# Patient Record
Sex: Male | Born: 1945 | Race: White | Hispanic: No | Marital: Married | Smoking: Former smoker
Health system: Southern US, Community
[De-identification: ages and names within clinical notes are randomized; demographics above are authoritative.]

## PROBLEM LIST (undated history)

## (undated) DIAGNOSIS — M1990 Unspecified osteoarthritis, unspecified site: Secondary | ICD-10-CM

## (undated) DIAGNOSIS — I10 Essential (primary) hypertension: Secondary | ICD-10-CM

## (undated) DIAGNOSIS — D472 Monoclonal gammopathy: Secondary | ICD-10-CM

## (undated) DIAGNOSIS — C61 Malignant neoplasm of prostate: Secondary | ICD-10-CM

## (undated) DIAGNOSIS — E7800 Pure hypercholesterolemia, unspecified: Secondary | ICD-10-CM

## (undated) DIAGNOSIS — D649 Anemia, unspecified: Secondary | ICD-10-CM

## (undated) DIAGNOSIS — Z8739 Personal history of other diseases of the musculoskeletal system and connective tissue: Secondary | ICD-10-CM

## (undated) DIAGNOSIS — N189 Chronic kidney disease, unspecified: Secondary | ICD-10-CM

## (undated) DIAGNOSIS — K219 Gastro-esophageal reflux disease without esophagitis: Secondary | ICD-10-CM

## (undated) HISTORY — DX: Monoclonal gammopathy: D47.2

## (undated) HISTORY — PX: OTHER SURGICAL HISTORY: SHX169

## (undated) HISTORY — DX: Pure hypercholesterolemia, unspecified: E78.00

## (undated) HISTORY — PX: TONSILLECTOMY: SUR1361

---

## 2014-01-30 ENCOUNTER — Other Ambulatory Visit: Payer: Self-pay | Admitting: Physician Assistant

## 2014-01-30 ENCOUNTER — Ambulatory Visit
Admission: RE | Admit: 2014-01-30 | Discharge: 2014-01-30 | Disposition: A | Discharge status: Home / Self Care | Payer: Commercial Managed Care - HMO | Source: Ambulatory Visit | Attending: Physician Assistant | Admitting: Physician Assistant

## 2014-01-30 DIAGNOSIS — M25462 Effusion, left knee: Secondary | ICD-10-CM

## 2014-01-30 DIAGNOSIS — M25562 Pain in left knee: Principal | ICD-10-CM

## 2015-01-26 DIAGNOSIS — M109 Gout, unspecified: Secondary | ICD-10-CM | POA: Diagnosis not present

## 2015-01-26 DIAGNOSIS — N183 Chronic kidney disease, stage 3 (moderate): Secondary | ICD-10-CM | POA: Diagnosis not present

## 2015-01-26 DIAGNOSIS — I129 Hypertensive chronic kidney disease with stage 1 through stage 4 chronic kidney disease, or unspecified chronic kidney disease: Secondary | ICD-10-CM | POA: Diagnosis not present

## 2015-02-26 DIAGNOSIS — N183 Chronic kidney disease, stage 3 (moderate): Secondary | ICD-10-CM | POA: Diagnosis not present

## 2015-02-26 DIAGNOSIS — I129 Hypertensive chronic kidney disease with stage 1 through stage 4 chronic kidney disease, or unspecified chronic kidney disease: Secondary | ICD-10-CM | POA: Diagnosis not present

## 2015-08-31 DIAGNOSIS — N402 Nodular prostate without lower urinary tract symptoms: Secondary | ICD-10-CM | POA: Diagnosis not present

## 2015-08-31 DIAGNOSIS — N183 Chronic kidney disease, stage 3 (moderate): Secondary | ICD-10-CM | POA: Diagnosis not present

## 2015-08-31 DIAGNOSIS — Z125 Encounter for screening for malignant neoplasm of prostate: Secondary | ICD-10-CM | POA: Diagnosis not present

## 2015-08-31 DIAGNOSIS — M109 Gout, unspecified: Secondary | ICD-10-CM | POA: Diagnosis not present

## 2015-08-31 DIAGNOSIS — Z0000 Encounter for general adult medical examination without abnormal findings: Secondary | ICD-10-CM | POA: Diagnosis not present

## 2015-08-31 DIAGNOSIS — I129 Hypertensive chronic kidney disease with stage 1 through stage 4 chronic kidney disease, or unspecified chronic kidney disease: Secondary | ICD-10-CM | POA: Diagnosis not present

## 2015-08-31 DIAGNOSIS — Z23 Encounter for immunization: Secondary | ICD-10-CM | POA: Diagnosis not present

## 2015-09-02 DIAGNOSIS — N402 Nodular prostate without lower urinary tract symptoms: Secondary | ICD-10-CM | POA: Diagnosis not present

## 2015-09-02 DIAGNOSIS — R972 Elevated prostate specific antigen [PSA]: Secondary | ICD-10-CM | POA: Diagnosis not present

## 2015-09-02 DIAGNOSIS — N289 Disorder of kidney and ureter, unspecified: Secondary | ICD-10-CM | POA: Diagnosis not present

## 2015-09-03 DIAGNOSIS — Z0000 Encounter for general adult medical examination without abnormal findings: Secondary | ICD-10-CM | POA: Diagnosis not present

## 2015-09-03 DIAGNOSIS — R972 Elevated prostate specific antigen [PSA]: Secondary | ICD-10-CM | POA: Diagnosis not present

## 2015-09-07 ENCOUNTER — Other Ambulatory Visit (HOSPITAL_COMMUNITY): Payer: Self-pay | Admitting: Urology

## 2015-09-07 DIAGNOSIS — C61 Malignant neoplasm of prostate: Secondary | ICD-10-CM

## 2015-09-09 ENCOUNTER — Encounter: Payer: Self-pay | Admitting: Medical Oncology

## 2015-09-09 NOTE — Progress Notes (Signed)
Oncology Nurse Navigator Documentation  Oncology Nurse Navigator Flowsheets 09/09/2015  Navigator Location [facility]  Navigator Encounter Type Introductory phone call  Abnormal Finding Date 08/31/2015  Confirmed Diagnosis Date 09/03/2015  Barriers/Navigation Needs No barriers at this time  Acuity Level 1  Acuity Level 1 Initial guidance, education and coordination as needed  Time Spent with Patient 15   I called pt to introduce myself as the Prostate Nurse Navigator and the Coordinator of the Prostate [place].  1. I confirmed with the patient he is aware of his referral to the clinic 09/18/2015 arriving at 7:30am.  2. I discussed the format of the clinic and the physicians he will be seeing that day.  3. I discussed where the clinic is located and how to contact me.  4. I confirmed his address and informed him I would be mailing a packet of information and forms to be completed. I asked him to bring them with him the day of his appointment.   He voiced understanding of the above. I asked him to call me if he has any questions or concerns regarding his appointments or the forms he needs to complete.

## 2015-09-14 ENCOUNTER — Encounter: Payer: Self-pay | Admitting: Medical Oncology

## 2015-09-14 NOTE — Progress Notes (Signed)
Oncology Nurse Navigator Documentation  Oncology Nurse Navigator Flowsheets 09/09/2015 09/14/2015  Navigator Location [facility] -  Navigator Encounter Type Introductory phone call -  Abnormal Finding Date 08/31/2015 -  Confirmed Diagnosis Date 09/03/2015 -  Barriers/Navigation Needs No barriers at this time -  Interventions - Coordination of Care  Coordination of Care - Other-I called [facility] to request pathology slides be sent to Dr. Orene Desanctis at [facility] pathology.  Acuity Level 1 -  Acuity Level 1 Initial guidance, education and coordination as needed -  Time Spent with Patient 15 15

## 2015-09-15 ENCOUNTER — Encounter: Payer: Self-pay | Admitting: Radiation Oncology

## 2015-09-15 NOTE — Progress Notes (Signed)
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 Raynelle Bring: Biopsy of Prostate  Past/Anticipated interventions by medical oncology, if any: referred to [facility] for evaluation by Dr. Alen Blew  Weight changes, if any: no  Bowel/Bladder complaints, if any: denies significant voiding symptoms, IPSS 1, mild to moderate ED  Nausea/Vomiting, if any: no  Pain issues, if any:  no  SAFETY ISSUES:  Prior radiation? No  Pacemaker/ICD? No  Possible current pregnancy? N/A  Is the patient on methotrexate? No  Current Complaints / other details:  70 year old male. Married. Stage III kidney disease. 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 Married to [employer]. Daughter Sharyn Lull, lives in [place] and is unemployed. Daughter Izora Gala, lives in [place]. Son Corene Cornea, died. Reports he has never had a colonoscopy. Reports he does not perform routine self testicular exams.  Reports fatigue. Wears glasses and dentures.   Signed on behalf of Joaquim Lai, RN

## 2015-09-16 ENCOUNTER — Encounter: Payer: Self-pay | Admitting: Radiation Oncology

## 2015-09-16 ENCOUNTER — Encounter: Payer: Self-pay | Admitting: Adult Health

## 2015-09-16 DIAGNOSIS — C61 Malignant neoplasm of prostate: Secondary | ICD-10-CM | POA: Insufficient documentation

## 2015-09-17 ENCOUNTER — Telehealth: Payer: Self-pay | Admitting: Medical Oncology

## 2015-09-17 NOTE — Telephone Encounter (Signed)
Oncology Nurse Navigator Documentation  Oncology Nurse Navigator Flowsheets 09/09/2015 09/14/2015 09/17/2015  Navigator Location [facility] - -  Navigator Encounter Type Introductory phone call - Telephone  Telephone - - Outgoing Call;Appt Confirmation/Clarification- Left a message for Mr. Penton to remind him of his appointment for the Prostate [facility] 09/18/15. I reviewed the location of the [facility] and how to register. I asked him to bring his completed medical forms.  Abnormal Finding Date 08/31/2015 - -  Confirmed Diagnosis Date 09/03/2015 - -  Barriers/Navigation Needs No barriers at this time - -  Interventions - Coordination of Care -  Coordination of Care - Other -  Acuity Level 1 - Level 1  Acuity Level 1 Initial guidance, education and coordination as needed - Initial guidance, education and coordination as needed  Time Spent with Patient 15 15 15

## 2015-09-18 ENCOUNTER — Encounter: Payer: Self-pay | Admitting: Medical Oncology

## 2015-09-18 ENCOUNTER — Ambulatory Visit
Admission: RE | Admit: 2015-09-18 | Discharge: 2015-09-18 | Disposition: A | Discharge status: Home / Self Care | Payer: Medicare Other | Source: Ambulatory Visit | Attending: Radiation Oncology | Admitting: Radiation Oncology

## 2015-09-18 ENCOUNTER — Encounter: Payer: Self-pay | Admitting: Radiation Oncology

## 2015-09-18 ENCOUNTER — Ambulatory Visit (HOSPITAL_BASED_OUTPATIENT_CLINIC_OR_DEPARTMENT_OTHER): Payer: Medicare Other | Admitting: Oncology

## 2015-09-18 ENCOUNTER — Encounter: Payer: Self-pay | Admitting: General Practice

## 2015-09-18 ENCOUNTER — Encounter: Payer: Self-pay | Admitting: Adult Health

## 2015-09-18 VITALS — BP 161/61 | HR 63 | Resp 16 | Ht 69.0 in | Wt 192.7 lb

## 2015-09-18 DIAGNOSIS — C61 Malignant neoplasm of prostate: Secondary | ICD-10-CM

## 2015-09-18 DIAGNOSIS — N189 Chronic kidney disease, unspecified: Secondary | ICD-10-CM | POA: Insufficient documentation

## 2015-09-18 DIAGNOSIS — I10 Essential (primary) hypertension: Secondary | ICD-10-CM

## 2015-09-18 DIAGNOSIS — Z87891 Personal history of nicotine dependence: Secondary | ICD-10-CM

## 2015-09-18 DIAGNOSIS — Z806 Family history of leukemia: Secondary | ICD-10-CM

## 2015-09-18 DIAGNOSIS — M109 Gout, unspecified: Secondary | ICD-10-CM | POA: Diagnosis not present

## 2015-09-18 DIAGNOSIS — Z79899 Other long term (current) drug therapy: Secondary | ICD-10-CM | POA: Insufficient documentation

## 2015-09-18 DIAGNOSIS — I129 Hypertensive chronic kidney disease with stage 1 through stage 4 chronic kidney disease, or unspecified chronic kidney disease: Secondary | ICD-10-CM | POA: Insufficient documentation

## 2015-09-18 DIAGNOSIS — Z789 Other specified health status: Secondary | ICD-10-CM | POA: Diagnosis not present

## 2015-09-18 HISTORY — DX: Essential (primary) hypertension: I10

## 2015-09-18 HISTORY — DX: Gastro-esophageal reflux disease without esophagitis: K21.9

## 2015-09-18 HISTORY — DX: Personal history of other diseases of the musculoskeletal system and connective tissue: Z87.39

## 2015-09-18 HISTORY — DX: Chronic kidney disease, unspecified: N18.9

## 2015-09-18 HISTORY — DX: Malignant neoplasm of prostate: C61

## 2015-09-18 NOTE — Progress Notes (Signed)
Reason for Referral:  Prostate cancer.  HPI:  Thomas Guerrero is a 70 year old gentleman currently of [place] where he lived the majority of his life. He is a rather healthy gentleman with history of gout, hypertension and arthritis. He was found to have a elevated PSA routine physical with PSA of 33.83. He was referred to Dr. Alinda Money and underwent a biopsy after a concerning digital rectal examination. His exam revealed right-sided prostate nodule suspicious for malignancy. Biopsy obtained on 09/03/2015 which showed prostate cancer with a Gleason score 3+4 = 7 in the majority of his 11 out of 12 cores. He did have another pattern of 3+3 = 6. He does not have any urinary symptoms including hematuria, dysuria or frequency. He denies any frequent nocturia at this time. He does have moderate erectile dysfunction. 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   He does not report any headaches blurred vision, syncope or seizures. Does not report any fevers, chills sweats or weight loss. Does not report any chest pain, palpitation orthopnea. Does not report any cough, wheezing or hemoptysis. Does not report any nausea, vomiting or abdominal pain. Does not report any skeletal complaints of arthralgias or myalgias. Remaining review of systems unremarkable.  Past Medical History  Diagnosis Date  . Prostate cancer ([place])   . Hx of gout   . Hypertension   . Chronic kidney disease   . GERD (gastroesophageal reflux disease)   :  Past Surgical History  Procedure Laterality Date  . Biopsy of the prostate    :   Current outpatient prescriptions:  .  benazepril (LOTENSIN) 10 MG tablet, Take 10 mg by mouth daily., Disp: , Rfl:  .  calcium carbonate (TUMS - DOSED IN MG ELEMENTAL CALCIUM) 500 MG chewable tablet, Chew 1 tablet by mouth daily., Disp: , Rfl:  .  cholecalciferol (VITAMIN D) 1000 units tablet, Take 1,000 Units by mouth daily., Disp: , Rfl:  .  colchicine 0.6 MG tablet, Take 0.6 mg by  mouth daily., Disp: , Rfl:  .  famotidine (PEPCID) 10 MG tablet, Take 10 mg by mouth 2 (two) times daily. Reported on 09/18/2015, Disp: , Rfl:  .  Misc Natural Products (TART CHERRY ADVANCED PO), Take by mouth., Disp: , Rfl:  .  Omega-3 Fatty Acids (FISH OIL) 1000 MG CAPS, Take by mouth., Disp: , Rfl:  .  vitamin B-12 (CYANOCOBALAMIN) 1000 MCG tablet, Take 1,000 mcg by mouth daily. Reported on 09/18/2015, Disp: , Rfl: :  No Known Allergies:  Family History  Problem Relation Age of Onset  . Cancer Son     leukemia  :  Social History   Social History  . Marital Status: Married    Spouse Name: N/A  . Number of Children: N/A  . Years of Education: N/A   Occupational History  . Not on file.   Social History Main Topics  . Smoking status: Former Smoker -- 25 years    Types: Cigarettes    Start date: 08/08/1968  . Smokeless tobacco: Never Used  . Alcohol Use: 0.0 oz/week    0 Standard drinks or equivalent per week  . Drug Use: No  . Sexual Activity: Yes   Other Topics Concern  . Not on file   Social History Narrative  :  Pertinent items are noted in HPI.  Exam: ECOG 0 General appearance: alert and cooperative Head: Normocephalic, without obvious abnormality Throat: lips, mucosa, and tongue normal; teeth and gums normal Neck: no adenopathy Back:  negative Resp: clear to auscultation bilaterally Chest wall: no tenderness Cardio: regular rate and rhythm, S1, S2 normal, no murmur, click, rub or gallop GI: soft, non-tender; bowel sounds normal; no masses,  no organomegaly Extremities: extremities normal, atraumatic, no cyanosis or edema Pulses: 2+ and symmetric Skin: Skin color, texture, turgor normal. No rashes or lesions Lymph nodes: Cervical, supraclavicular, and axillary nodes normal.    Assessment and Plan:    70 year old gentleman with a prostate cancer diagnosed in January 2017. He presented with a PSA of 33.83 and found to have a Gleason score of 3+4 = 7 as  well as Gleason score 3+3 = 6 in total of 11 out of 12 cores. He denies any urinary symptoms at this time. His staging workup including CT scan and a bone scan is currently pending and have not been completed yet.   His case was discussed today and the prostate cancer multidisciplinary clinic and the findings were reviewed with the patient today extensively. His options of treatment will be dictated by the results of his staging workup. If he has metastatic disease outside of the prostate which is possible, he will require systemic therapy.   The options of systemic therapy were reviewed today which included androgen deprivation and possibly systemic chemotherapy. The rationale for using systemic chemotherapy in addition to hormone therapy was reviewed today. That would include high volume disease especially if he has multiple lytic bone lesions. He has visceral metastasis he would also benefit from this approach. The complications associated with androgen deprivation was also reviewed.   If he has organ confined disease, local therapy will be indicated. He would be a good candidate for radiation therapy in addition to androgen deprivation for  2-3 years.   The plan is to await the results of his staging workup and determine the final course of action.   He is unaware of these possibilities and all his questions were answered today.

## 2015-09-18 NOTE — Progress Notes (Signed)
[place] Psychosocial Distress Screening Spiritual Care  Met with Mr Gladney and his wife in [facility] to introduce [facility] team/resources, reviewing distress screen per protocol.  The patient scored a 3 on the Psychosocial Distress Thermometer which indicates mild distress. Also assessed for distress and other psychosocial needs.   ONCBCN DISTRESS SCREENING 09/18/2015  Screening Type Initial Screening  Distress experienced in past week (1-10) 3  Family Problem type Children  Physical Problem type Pain;Skin dry/itchy  Referral to support programs Yes  Other [place] team   Mr Boliver reports minor distress from his dx.  Couple shared some stress associated with family relationships.  Additionally, wife Jenny Reichmann shared that pt's dx stirs up feelings from her own hx bladder cancer and her son's death from leukemia six years ago (at age 28).  Provided pastoral presence, reflective listening, normalization of feelings, and encouragement to seek support from others as well as from each other.  Described Support Center team/resources in detail, providing packet of print materials and contact info.  Follow up needed: No.  Plan to f/u by phone or note to offer further support/encouragement, but please also page as needs arise.  Thank you.  [place], [place], [facility] Pager [phone] Voicemail  [phone]

## 2015-09-18 NOTE — Progress Notes (Signed)
                               Care Plan Summary  Name: Thomas Guerrero DOB: [birth year]   Your Medical Team:   Urologist -  Dr. Raynelle Bring, [facility]  Radiation Oncologist - Dr.Matthew Micki [facility]   Medical Oncologist - Dr. Zola Button, [place]  Recommendations: 1) CT scan and Bone Scan 09/22/15  2) Androgen Deprivation Therapy- (Hormone Therapy) 3) Radiation Therapy  * These recommendations are based on information available as of today's consult.      Recommendations may change depending on the results of further tests or exams.  Next Steps: 1) CT and Bone Scan - Dr. Alinda Money will call you to discuss. If results negative  2) Dr. Laney Pastor' office will schedule hormone injection and place gold markers 3) Dr. Johny Shears office will schedule radiation treatments  When appointments need to be scheduled, you will be contacted by [facility] and/or [facility].  Questions?  Please do not hesitate to call Cira Rue, RN, BSN, CRNI at [phone] any questions or concerns.  Shirlean Mylar is your Oncology Nurse Navigator and is available to assist you while you're receiving your medical care at [facility].

## 2015-09-18 NOTE — Progress Notes (Signed)
   Survivorship Program  [name] is a very pleasant 70 y.o. gentleman from [place], [place] with a diagnosis of prostate adenocarcinoma. He presents today with his wife to the [facility]) for treatment consideration and recommendations from the urologist/surgeon, radiation oncologist, and medical oncologist.    I briefly met with [name] and his wife during his [place] visit today. We discussed the purpose of the Survivorship Clinic, which will include monitoring for recurrence, coordinating completion of age and gender-appropriate cancer screenings, promotion of overall wellness, as well as managing potential late/long-term side effects of anti-cancer treatments.     As of today, the intent of treatment for [name] is cure/local control, therefore he will be eligible for the Survivorship Clinic upon his completion of treatment.  His survivorship care plan (SCP) will be reviewed with him during an in-person visit with myself once he has completed treatment.    [name] was encouraged to ask questions and all questions were answered to his satisfaction.  He was given my business card and encouraged to contact me with any concerns regarding survivorship.  I look forward to participating in his care.    Mike Craze, NP [place] [phone]

## 2015-09-18 NOTE — Progress Notes (Signed)
Radiation Oncology         [phone] ________________________________  Multidisciplinary Prostate Cancer Clinic  Initial Radiation Oncology Consultation  Name: Thomas Guerrero MRN: [id number]  Date: 09/18/2015  DOB: 1945/09/24  BP:ZWCHENI,DPOEUM Thomas Sers, MD  Raynelle Bring, MD   REFERRING PHYSICIAN: Raynelle Bring, MD  DIAGNOSIS: 70 y.o. gentleman with stage T3a adenocarcinoma of the prostate with a Gleason's score of 3+4 and a PSA of 33.83    ICD-9-CM ICD-10-CM   1. Malignant neoplasm of prostate ([place]) Thomas Guerrero is a 70 y.o. gentleman.  Apparently the patient has not been under regular medical care until 6 months ago when he establishes care with Dr. Laurann Montana. He was noted to have an elevated PSA of 33.83 during his initial assessment with Dr. Laurann Montana.  Accordingly, he was referred for evaluation in urology by Dr. Alinda Money on 09/02/15,  digital rectal examination was performed at that time revealing extension nodularity on the left side with ECE.  The patient proceeded to transrectal ultrasound with 12 biopsies of the prostate on 09/03/15.  The prostate volume measured 76.7 cc.  Out of 12 core biopsies, 11 were positive.  The maximum Gleason score was 3+4, and this was seen in the left lateral base, left base, left mid, right base, and right lateral apex.  The patient reviewed the biopsy results with his urologist and he has kindly been referred today to the multidisciplinary prostate cancer clinic for presentation of pathology and radiology studies in our conference for discussion of potential radiation treatment options and clinical evaluation. Also of note the patient has a bone scan and CT scan of the chest abdomen and pelvis pending.    PREVIOUS RADIATION THERAPY: No  PAST MEDICAL HISTORY:  Past Medical History  Diagnosis Date  . Prostate cancer ([place])   . Hx of gout   . Hypertension   . Chronic kidney disease   . GERD  (gastroesophageal reflux disease)      PAST SURGICAL HISTORY: Past Surgical History  Procedure Laterality Date  . Biopsy of the prostate      FAMILY HISTORY: family history includes Cancer in his son.  SOCIAL HISTORY:  reports that he has quit smoking. His smoking use included Cigarettes. He started smoking about 47 years ago. He quit after 25 years of use. He has never used smokeless tobacco. He reports that he drinks alcohol. He reports that he does not use illicit drugs.   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 He is married and has adult children.  ALLERGIES: Review of patient's allergies indicates no known allergies.  MEDICATIONS:  Current Outpatient Prescriptions  Medication Sig Dispense Refill  . benazepril (LOTENSIN) 10 MG tablet Take 10 mg by mouth daily.    . calcium carbonate (TUMS - DOSED IN MG ELEMENTAL CALCIUM) 500 MG chewable tablet Chew 1 tablet by mouth daily.    . cholecalciferol (VITAMIN D) 1000 units tablet Take 1,000 Units by mouth daily.    . colchicine 0.6 MG tablet Take 0.6 mg by mouth daily.    . Misc Natural Products (TART CHERRY ADVANCED PO) Take by mouth.    . Omega-3 Fatty Acids (FISH OIL) 1000 MG CAPS Take by mouth.    . vitamin B-12 (CYANOCOBALAMIN) 1000 MCG tablet Take 1,000 mcg by mouth daily. Reported on 09/18/2015    . famotidine (PEPCID) 10 MG tablet Take 10 mg by mouth 2 (  two) times daily. Reported on 09/18/2015     No current facility-administered medications for this encounter.    REVIEW OF SYSTEMS:   On review of systems, the patient reports that overall he is doing quite well. He denies any significant symptoms of  Nocturia, hematuria, urgency, frequency or hesitancy. He states mild to moderate erectile dysfunction. He is not experiencing any nausea, vomiting, abdominal pain. He is able to have normal bowel function. He denies shortness of breath or chest pain fevers or chills. A complete review of  systems is obtained and is otherwise negative.    PHYSICAL EXAM:   height is $RemoveB'5\' 9"'rLjzwttc$  (1.753 m) and weight is 192 lb 11.2 oz (87.408 kg). His blood pressure is 161/61 and his pulse is 63. His respiration is 16 and oxygen saturation is 100%.   Pain scale 0/10  in general this is a well-appearing Caucasian male in no acute distress. He is alert and oriented 4 and appropriate throughout the examination. Cardiovascular exam reveals a regular rate and rhythm, no clicks rubs or murmurs are auscultated. Chest is clear to auscultation bilaterally. Lymphatic review is performed and does not reveal any palpable adenopathy of the cervical, supraclavicular or axillary chains. No palpable inguinal adenopathy is otherwise noted. The abdomen is intact with bowel sounds 4. His abdomen is soft, non tender, non distended without palpable fascial defects or hepatosplenomegaly.  KPS = 100  100 - Normal; no complaints; no evidence of disease. 90   - Able to carry on normal activity; minor signs or symptoms of disease. 80   - Normal activity with effort; some signs or symptoms of disease. 78   - Cares for self; unable to carry on normal activity or to do active work. 60   - Requires occasional assistance, but is able to care for most of his personal needs. 50   - Requires considerable assistance and frequent medical care. 62   - Disabled; requires special care and assistance. 14   - Severely disabled; hospital admission is indicated although death not imminent. 70   - Very sick; hospital admission necessary; active supportive treatment necessary. 10   - Moribund; fatal processes progressing rapidly. 0     - Dead  Karnofsky DA, Abelmann WH, Craver LS and Burchenal JH [phone]) The use of the nitrogen mustards in the palliative treatment of carcinoma: with particular reference to bronchogenic carcinoma Cancer 1 634-56   LABORATORY DATA:  No results found for: WBC, HGB, HCT, MCV, PLT No results found for: NA, K, CL,  CO2 No results found for: ALT, AST, GGT, ALKPHOS, BILITOT   RADIOGRAPHY: No results found.    IMPRESSION: This gentleman is a pleasant 70 year-old with stage T3 adenocarcinoma of the prostate with a Gleason's score of 3+4 and a PSA of 33.83.  His T-Stage, Gleason's Score, and PSA put him into the high risk group.  Accordingly he is eligible for a variety of potential treatment options including radical prostatectomy, external beam radiation treatment, and androgen deprivation therapy.  A bone scan and CT scan will be performed to evaluate possible metastasis.   PLAN: Today  Dr. Tammi Klippel reviewed the findings and workup thus far.  We discussed the natural history of prostate cancer.  We reviewed the the implications of T-stage, Gleason's Score, and PSA on decision-making and outcomes in prostate cancer.  We discussed radiation treatment in the management of prostate cancer with regard to the logistics and delivery of external beam radiation treatment.  We discussed that  given the high volume of disease, Dr. Alinda Money is concerned  That surgery may not be able to accomplish  Negative margins , and due to this recommendation is for the patient undergo androgen deprivation therapy for 2 years  With radiation therapy given externally to the prostate and regional lymph nodes area and we will await the results of his metastatic workup from his bone scan and CT scan. If significant findings that would alter his recommendations are noted, we would me back to discuss  This face to face.  Dr. Tammi Klippel discusses that he would proceed with radiation therapy 2 months after androgen deprivation therapy begins, followed by placement of fiducial markers by Dr. Alinda Money. We will plan to see him back in April to begin this treatment.  We enjoyed meeting with him today, and will look forward to participating in the care of this very nice gentleman.   We spent about 60 minutes face to face with the patient and more than 50% of  that time was spent in counseling and/or coordination of care.   The above documentation reflects my direct findings during this shared patient visit. Please see the separate note by Dr.  Tammi Klippel on this date for the remainder of the patient's plan of care.  Carola Rhine, PAC     This document serves as a record of services personally performed by Shona Simpson, PA and Tyler Pita, MD. It was created on their behalf by Arlyce Harman, a trained medical scribe. The creation of this record is based on the scribe's personal observations and the provider's statements to them. This document has been checked and approved by the attending provider.

## 2015-09-18 NOTE — Patient Instructions (Signed)
Contact our office if you have any questions following today's appointment: [phone].  

## 2015-09-18 NOTE — Consult Note (Signed)
Chief Complaint  Prostate Cancer   Reason For Visit  Reason for consult: To discuss treatment options for high risk prostate cancer. PCP: Dr. [name] Location of consult: Prostate Cancer [facility] at [facility]   History of Present Illness  Thomas Guerrero is a 70 year old gentleman with a history of gout, chronic kidney disease (Cr 2.1), and hypertension.  He had never seen a physician for routine care until about 6 months ago. He initially presented to me on 09/02/15 for further evaluation of an abnormal DRE and elevated PSA of 33.83 detected on a routine physical exam by Dr. Laurann Montana in January.  On my exam, he had bilateral palpable disease with nodularity and induration of the right mid and apical gland and the left base and mid gland with suspicious of extraprostatic extension on the left.  He proceeded with a TRUS biopsy of the prostate on 09/03/15 confirming Gleason 3+4=7 adenocarcinoma of the prostate with 11 out of 12 biopsy cores positive for malignancy.  He has no family history of prostate cancer.  His staging studies including a bone scan and CT of the pelvis are currently scheduled for 09/22/15.   TNM stage: cT3a Nx Mx PSA: 33.83 Gleason score: 3+4=7 Biopsy (09/02/15): 11/12 cores positive    Left: L lateral apex (50%, 3+3=6, PNI), L apex (25%, 3+3=6), L lateral mid (50%, 3+3=6, PNI), L mid (50%, 3+4=7), L lateral base 95%, 3+4=7, PNI), L base (40%, 3+4=7)    Right: R lateral apex (30%, 3+4=7), R mid (95%, 3+3=6), R lateral mid (70%, 3+3=6), R base (5%, 3+4=7), R lateral base (5%, 3+3=6) Prostate volume: 76.7 cc  Nomogram OC disease: 2% EPE: 97% SVI: 48% LNI: 47% PFS (surgery): 24% at 5 years, 14% at 10 years  Urinary function: IPSS is 1. Erectile function: He has moderate erectile dysfunction. SHIM score is 20.  Interval history:  Thomas Guerrero follows up today with his wife to discuss his recent diagnosis of prostate cancer.  He was provided educational  information reviewed prior to his appointment today.  He unfortunately has not yet undergone his staging studies and his bone scan and CT scan are scheduled to be performed on February 14.  He has recovered well from his biopsy and has no specific complaints today.   Past Medical History  1. History of chronic kidney disease (Z87.448)  2. History of gout (Z87.39)  3. History of hypertension (Z86.79)  Surgical History  1. History of No Surgical Problems  Current Meds  1. Calcium 500 TABS;  Therapy: (Recorded:25Jan2017) to Recorded  2. Colchicine 0.6 MG Oral Tablet;  Therapy: (Recorded:25Jan2017) to Recorded  3. LevoFLOXacin 500 MG Oral Tablet; 1 tablet the day before procedure, 1 tablet day of  procedure, and 1 tablet day after procedure;  Therapy: [id number] to (Last Rx:25Jan2017)  Requested for: 25Jan2017 Ordered  4. Lotensin TABS;  Therapy: (Recorded:25Jan2017) to Recorded  5. Pepcid TABS;  Therapy: (Recorded:25Jan2017) to Recorded  6. Vitamin B12 TABS;  Therapy: (Recorded:25Jan2017) to Recorded  7. Vitamin D TABS;  Therapy: (Recorded:25Jan2017) to Recorded  8. Adacel 12-07-13.5 LF-MCG/0.5 SUSP;  Therapy: 23Jan2017 to Recorded  9. Benazepril HCl - 10 MG Oral Tablet;  Therapy: [id number] to Recorded  10. Benazepril HCl - 20 MG Oral Tablet;   Therapy: 23Jan2017 to Recorded  Allergies  1. No Known Drug Allergies  Family History  1. Family history of Deceased : Mother, Father  Social History   Alcohol use (Z78.9)   Former smoker [phone])  Married  Physical Exam Constitutional: Well nourished and well developed . No acute distress.    Results/Data  I have reviewed his medical records, PSA results, and pathology slides in the multidisciplinary conference this morning with the multidisciplinary group physicians.  Findings are as outlined above.     Assessment  1. Prostate cancer (C61)  Discussion/Summary  1.  High risk, locally advanced prostate cancer: Thomas Guerrero  understands that he does have at least high risk and locally advanced prostate cancer based on the currently available information.  He understands the importance of proceeding with his full staging evaluation to rule out the presence of metastatic disease.  If present, he understands that this would be an incurable but treatable situation and that he would require systemic therapy with androgen deprivation plus or minus systemic chemotherapy.  He is scheduled to see Dr. Alen Blew later this morning to further discuss this potential scenario.  We proceeded with our discussion today with the assumption that his staging studies would be negative for metastatic disease.  In that situation, we discussed various strategies for treating locally advanced disease with curative intent including primary surgical therapy likely with need for multimodality treatment in the adjuvant/salvage setting.  We also discussed the option of primary radiation therapy in conjunction with long-term androgen deprivation therapy as an option.  We reviewed the pros and cons of each of these approaches today.   The patient was counseled about the natural history of prostate cancer and the standard treatment options that are available for prostate cancer. It was explained to him how his age and life expectancy, clinical stage, Gleason score, and PSA affect his prognosis, the decision to proceed with additional staging studies, as well as how that information influences recommended treatment strategies. We discussed the roles for active surveillance, radiation therapy, surgical therapy, androgen deprivation, as well as ablative therapy options for the treatment of prostate cancer as appropriate to his individual cancer situation. We discussed the risks and benefits of these options with regard to their impact on cancer control and also in terms of potential adverse events, complications, and impact on quiality of life particularly related to  urinary, bowel, and sexual function. The patient was encouraged to ask questions throughout the discussion today and all questions were answered to his stated satisfaction. In addition, the patient was provided with and/or directed to appropriate resources and literature for further education about prostate cancer and treatment options.   We will await the results of his staging studies next week and he will be notified of these results as soon as possible.  He appears to be leaning toward treatment with long-term androgen deprivation and primary radiation therapy if he does not have metastatic disease.  All questions were answered to his stated satisfaction.  I'll contact him following his studies next week.  He will meet with Dr. Tammi Klippel and Dr. Alen Blew later this morning to further discuss options in more detail.  Cc: Dr. [name] Dr. Tyler Pita Dr. Zola Button  A total of 42 minutes were spent in the overall care of the patient today with 42 minutes in direct face to face consultation.    Signatures Electronically signed by : Raynelle Bring, M.D.; Sep 18 2015 12:35PM EST

## 2015-09-21 ENCOUNTER — Encounter: Payer: Self-pay | Admitting: *Deleted

## 2015-09-22 ENCOUNTER — Encounter (HOSPITAL_COMMUNITY)
Admission: RE | Admit: 2015-09-22 | Discharge: 2015-09-22 | Disposition: A | Discharge status: Home / Self Care | Payer: Medicare Other | Source: Ambulatory Visit | Attending: Urology | Admitting: Urology

## 2015-09-22 DIAGNOSIS — C61 Malignant neoplasm of prostate: Secondary | ICD-10-CM | POA: Diagnosis not present

## 2015-09-22 DIAGNOSIS — M25562 Pain in left knee: Secondary | ICD-10-CM | POA: Diagnosis not present

## 2015-09-22 MED ORDER — TECHNETIUM TC 99M MEDRONATE IV KIT
23.0000 | PACK | Freq: Once | INTRAVENOUS | Status: AC | PRN
  Administered 2015-09-22: 23 via INTRAVENOUS

## 2015-10-09 DIAGNOSIS — Z125 Encounter for screening for malignant neoplasm of prostate: Secondary | ICD-10-CM | POA: Diagnosis not present

## 2015-10-15 ENCOUNTER — Telehealth: Payer: Self-pay | Admitting: *Deleted

## 2015-10-15 DIAGNOSIS — C61 Malignant neoplasm of prostate: Secondary | ICD-10-CM | POA: Diagnosis not present

## 2015-10-15 NOTE — Telephone Encounter (Signed)
Phoned patient to inform of sim for 10-23-15 @ 9 am, gold seeds placed on 10-15-15 @ [facility], spoke with patient and patient is aware of this appt.

## 2015-10-22 ENCOUNTER — Telehealth: Payer: Self-pay | Admitting: Medical Oncology

## 2015-10-22 NOTE — Telephone Encounter (Signed)
Oncology Nurse Navigator Documentation  Oncology Nurse Navigator Flowsheets 09/18/2015 10/22/2015 10/22/2015  Navigator Location - - -  Navigator Encounter Type Clinic/MDC Telephone;MDC Follow-up Telephone- Spoke with Mr. Ndiaye to confirm his appointment for CT sim. We reviewed the registration process and CT sim.    Telephone - Outgoing Call;Appt Confirmation/Clarification Incoming Call;Clinic/MDC Follow-up  Abnormal Finding Date - - -  Confirmed Diagnosis Date - - -  Barriers/Navigation Needs No barriers at this time - Medical laboratory scientific officer - - Orthoptist Treatment  Interventions Education Method - Education Method  Coordination of Care - - -  Education Method Verbal;Written - Verbal  Support Groups/Services Prostate Support Group;Friends and Family - -  Acuity Level 2 - Level 2  Acuity Level 1 - - -  Acuity Level 2 Initial guidance, education and coordination as needed;Educational needs - Initial guidance, education and coordination as needed;Educational needs  Time Spent with Patient 120 15 15

## 2015-10-22 NOTE — Telephone Encounter (Signed)
Oncology Nurse Navigator Documentation  Oncology Nurse Navigator Flowsheets 09/17/2015 09/18/2015 10/22/2015  Navigator Location - - -  Navigator Encounter Type Telephone Clinic/MDC Telephone;MDC Follow-up- left a message requesting a return call to follow up on Prostate MDC. Pt is scheduled for his CT simulation tomorrow. I want to confirm and make sure he does not have any questions or concerns.  Telephone Outgoing Call;Appt Confirmation/Clarification - Outgoing Call;Appt Confirmation/Clarification  Abnormal Finding Date - - -  Confirmed Diagnosis Date - - -  Barriers/Navigation Needs - No barriers at this time -  Interventions - Education Method -  Coordination of Care - - -  Education Method - Verbal;Written -  Support Groups/Services - Prostate Support Group;Friends and Family -  Acuity Level 1 Level 2 -  Acuity Level 1 Initial guidance, education and coordination as needed - -  Acuity Level 2 - Initial guidance, education and coordination as needed;Educational needs -  Time Spent with Patient 15 120 15

## 2015-10-23 ENCOUNTER — Ambulatory Visit
Admission: RE | Admit: 2015-10-23 | Discharge: 2015-10-23 | Disposition: A | Discharge status: Home / Self Care | Payer: Medicare Other | Source: Ambulatory Visit | Attending: Radiation Oncology | Admitting: Radiation Oncology

## 2015-10-23 DIAGNOSIS — Z510 Encounter for antineoplastic radiation therapy: Secondary | ICD-10-CM | POA: Insufficient documentation

## 2015-10-23 DIAGNOSIS — C61 Malignant neoplasm of prostate: Secondary | ICD-10-CM | POA: Insufficient documentation

## 2015-11-03 ENCOUNTER — Ambulatory Visit: Payer: Medicare Other

## 2015-11-04 ENCOUNTER — Ambulatory Visit: Payer: Medicare Other

## 2015-11-05 ENCOUNTER — Ambulatory Visit: Payer: Medicare Other

## 2015-11-05 ENCOUNTER — Encounter: Payer: Self-pay | Admitting: General Practice

## 2015-11-05 NOTE — Progress Notes (Signed)
Spiritual Care Note  Reached Thomas Guerrero by phone to offer f/u support.  Per pt, he's feeling little distress now, just waiting for radiation to begin in May.  Reminded him of [facility] team/resource availability, particularly while he will be at [facility] daily for treatment.  He plans to follow up if needs arise.  [place], [place], [facility] Pager [phone] Voicemail  [phone]

## 2015-11-06 ENCOUNTER — Ambulatory Visit: Payer: Medicare Other

## 2015-11-09 ENCOUNTER — Ambulatory Visit: Payer: Medicare Other

## 2015-11-10 ENCOUNTER — Ambulatory Visit: Payer: Medicare Other

## 2015-11-11 ENCOUNTER — Ambulatory Visit: Payer: Medicare Other

## 2015-11-12 ENCOUNTER — Ambulatory Visit: Payer: Medicare Other

## 2015-11-13 ENCOUNTER — Ambulatory Visit: Payer: Medicare Other

## 2015-11-16 ENCOUNTER — Ambulatory Visit: Payer: Medicare Other

## 2015-11-17 ENCOUNTER — Ambulatory Visit: Payer: Medicare Other

## 2015-11-18 ENCOUNTER — Ambulatory Visit: Payer: Medicare Other

## 2015-11-19 ENCOUNTER — Ambulatory Visit: Payer: Medicare Other

## 2015-11-20 ENCOUNTER — Ambulatory Visit: Payer: Medicare Other

## 2015-11-23 ENCOUNTER — Ambulatory Visit: Payer: Medicare Other

## 2015-11-24 ENCOUNTER — Ambulatory Visit: Payer: Medicare Other

## 2015-11-25 ENCOUNTER — Ambulatory Visit: Payer: Medicare Other

## 2015-11-26 ENCOUNTER — Ambulatory Visit: Payer: Medicare Other

## 2015-11-27 ENCOUNTER — Ambulatory Visit: Payer: Medicare Other

## 2015-11-30 ENCOUNTER — Ambulatory Visit: Payer: Medicare Other

## 2015-12-01 ENCOUNTER — Ambulatory Visit: Payer: Medicare Other

## 2015-12-02 ENCOUNTER — Ambulatory Visit: Payer: Medicare Other

## 2015-12-03 ENCOUNTER — Ambulatory Visit: Payer: Medicare Other

## 2015-12-04 ENCOUNTER — Ambulatory Visit: Payer: Medicare Other

## 2015-12-07 ENCOUNTER — Ambulatory Visit: Payer: Medicare Other

## 2015-12-08 ENCOUNTER — Ambulatory Visit: Payer: Medicare Other

## 2015-12-09 ENCOUNTER — Ambulatory Visit: Payer: Medicare Other

## 2015-12-10 ENCOUNTER — Ambulatory Visit: Payer: Medicare Other

## 2015-12-11 ENCOUNTER — Ambulatory Visit: Payer: Medicare Other

## 2015-12-14 ENCOUNTER — Ambulatory Visit: Payer: Medicare Other

## 2015-12-15 ENCOUNTER — Ambulatory Visit: Payer: Medicare Other

## 2015-12-16 ENCOUNTER — Ambulatory Visit: Payer: Medicare Other

## 2015-12-17 ENCOUNTER — Ambulatory Visit: Payer: Medicare Other

## 2015-12-18 ENCOUNTER — Ambulatory Visit
Admission: RE | Admit: 2015-12-18 | Discharge: 2015-12-18 | Disposition: A | Discharge status: Home / Self Care | Payer: Medicare Other | Source: Ambulatory Visit | Attending: Radiation Oncology | Admitting: Radiation Oncology

## 2015-12-18 ENCOUNTER — Encounter: Payer: Self-pay | Admitting: Medical Oncology

## 2015-12-18 ENCOUNTER — Ambulatory Visit: Payer: Medicare Other

## 2015-12-18 DIAGNOSIS — C61 Malignant neoplasm of prostate: Secondary | ICD-10-CM

## 2015-12-18 DIAGNOSIS — Z510 Encounter for antineoplastic radiation therapy: Secondary | ICD-10-CM | POA: Diagnosis not present

## 2015-12-18 NOTE — Progress Notes (Addendum)
  Radiation Oncology         [phone] ________________________________  Name: Thomas Guerrero MRN: [id number]  Date: 12/18/2015  DOB: Jul 17, 1946  SIMULATION AND TREATMENT PLANNING NOTE    ICD-9-CM ICD-10-CM   1. Prostate cancer ([place]) 185 C61     DIAGNOSIS:  Stage T3a adenocarcinoma of the prostate with a Gleason's score of 3+4 and a PSA of 33.83  NARRATIVE:  The patient was brought to the [facility].  Identity was confirmed.  All relevant records and images related to the planned course of therapy were reviewed.  The patient freely provided informed written consent to proceed with treatment after reviewing the details related to the planned course of therapy. The consent form was witnessed and verified by the simulation staff.  Then, the patient was set-up in a stable reproducible supine position for radiation therapy.  A vacuum lock pillow device was custom fabricated to position his legs in a reproducible immobilized position.  Then, I performed a urethrogram under sterile conditions to identify the prostatic apex.  CT images were obtained.  Surface markings were placed.  The CT images were loaded into the planning software.  Then the prostate target and avoidance structures including the rectum, bladder, bowel and hips were contoured.  Treatment planning then occurred.  The radiation prescription was entered and confirmed.  A total of 1 complex treatment devices were fabricated. I have requested : Intensity Modulated Radiotherapy (IMRT) is medically necessary for this case for the following reason:  Rectal sparing.  PLAN:  The patient will receive 75 Gy in 40 fractions with 45 Gy to the pelvis and a prostate boost.  ________________________________  Sheral Apley. Tammi Klippel, M.D.  This document serves as a record of services personally performed by Tyler Pita, MD. It was created on his behalf by Derek Mound, a trained medical scribe. The creation of this record is based  on the scribe's personal observations and the provider's statements to them. This document has been checked and approved by the attending provider.

## 2015-12-20 NOTE — Addendum Note (Signed)
Encounter addended by: Tyler Pita, MD on: 12/20/2015  3:54 PM<BR>     Documentation filed: Notes Section

## 2015-12-21 ENCOUNTER — Ambulatory Visit: Payer: Medicare Other

## 2015-12-22 ENCOUNTER — Ambulatory Visit: Payer: Medicare Other

## 2015-12-23 ENCOUNTER — Ambulatory Visit: Payer: Medicare Other

## 2015-12-24 ENCOUNTER — Ambulatory Visit: Payer: Medicare Other

## 2015-12-25 ENCOUNTER — Ambulatory Visit: Payer: Medicare Other

## 2015-12-25 DIAGNOSIS — C61 Malignant neoplasm of prostate: Secondary | ICD-10-CM | POA: Diagnosis not present

## 2015-12-25 DIAGNOSIS — Z510 Encounter for antineoplastic radiation therapy: Secondary | ICD-10-CM | POA: Diagnosis not present

## 2015-12-28 ENCOUNTER — Ambulatory Visit: Payer: Medicare Other

## 2015-12-28 DIAGNOSIS — L0291 Cutaneous abscess, unspecified: Secondary | ICD-10-CM | POA: Diagnosis not present

## 2015-12-29 ENCOUNTER — Ambulatory Visit
Admission: RE | Admit: 2015-12-29 | Discharge: 2015-12-29 | Disposition: A | Discharge status: Home / Self Care | Payer: Medicare Other | Source: Ambulatory Visit | Attending: Radiation Oncology | Admitting: Radiation Oncology

## 2015-12-29 DIAGNOSIS — C61 Malignant neoplasm of prostate: Secondary | ICD-10-CM | POA: Diagnosis not present

## 2015-12-29 DIAGNOSIS — Z510 Encounter for antineoplastic radiation therapy: Secondary | ICD-10-CM | POA: Diagnosis not present

## 2015-12-30 ENCOUNTER — Ambulatory Visit
Admission: RE | Admit: 2015-12-30 | Discharge: 2015-12-30 | Disposition: A | Discharge status: Home / Self Care | Payer: Medicare Other | Source: Ambulatory Visit | Attending: Radiation Oncology | Admitting: Radiation Oncology

## 2015-12-30 ENCOUNTER — Encounter: Payer: Self-pay | Admitting: Medical Oncology

## 2015-12-30 DIAGNOSIS — Z510 Encounter for antineoplastic radiation therapy: Secondary | ICD-10-CM | POA: Diagnosis not present

## 2015-12-30 DIAGNOSIS — C61 Malignant neoplasm of prostate: Secondary | ICD-10-CM

## 2015-12-30 NOTE — Progress Notes (Signed)
Oriented patient to staff and routine of the clinic. Provided patient with RADIATION THERAPY AND YOU handbook then, reviewed pertinent information. Educated patient reference potential side effects and management such as fatigue, urinary/bladder changes, and diarrhea. Answered all patient questions to the best of my ability. Provided patient with my business card and encouraged him to call with needs. Patient verbalized understanding of all reviewed.

## 2015-12-30 NOTE — Progress Notes (Signed)
Oncology Nurse Navigator Documentation  Oncology Nurse Navigator Flowsheets 12/18/2015 12/29/2015 12/30/2015  Navigator Location - - -  Navigator Encounter Type - Treatment Mr. Sida states that his radiation treatments are going well. He received education materials today from Sam regarding symptoms and symptoms management. We discussed some of the most common. He is aware he will follow up with Dr. Tammi Klippel weekly during his treatments. I asked him to call me with any questions or concerns. He voiced understandign.  Telephone - - -  Abnormal Finding Date - - -  Confirmed Diagnosis Date - - -  Treatment Initiated Date - 12/29/2015 -  Patient Visit Type - - RadOnc  Treatment Phase CT SIM First Radiation Tx Treatment  Barriers/Navigation Needs - - No barriers at this time;Education  Education - - Pain/ Symptom Management  Interventions - - Education Method  Coordination of Care - - -  Education Method - - Verbal;Written  Support Groups/Services - - Friends and Family  Acuity Level 1 - -  Acuity Level 1 Initial guidance, education and coordination as needed - -  Acuity Level 2 - - -  Time Spent with Patient - - 30

## 2015-12-31 ENCOUNTER — Ambulatory Visit
Admission: RE | Admit: 2015-12-31 | Discharge: 2015-12-31 | Disposition: A | Discharge status: Home / Self Care | Payer: Medicare Other | Source: Ambulatory Visit | Attending: Radiation Oncology | Admitting: Radiation Oncology

## 2015-12-31 DIAGNOSIS — C61 Malignant neoplasm of prostate: Secondary | ICD-10-CM | POA: Diagnosis not present

## 2015-12-31 DIAGNOSIS — Z510 Encounter for antineoplastic radiation therapy: Secondary | ICD-10-CM | POA: Diagnosis not present

## 2016-01-01 ENCOUNTER — Encounter: Payer: Self-pay | Admitting: Radiation Oncology

## 2016-01-01 ENCOUNTER — Ambulatory Visit
Admission: RE | Admit: 2016-01-01 | Discharge: 2016-01-01 | Disposition: A | Discharge status: Home / Self Care | Payer: Medicare Other | Source: Ambulatory Visit | Attending: Radiation Oncology | Admitting: Radiation Oncology

## 2016-01-01 VITALS — BP 144/53 | HR 63 | Resp 16 | Wt 190.5 lb

## 2016-01-01 DIAGNOSIS — C61 Malignant neoplasm of prostate: Secondary | ICD-10-CM

## 2016-01-01 DIAGNOSIS — Z510 Encounter for antineoplastic radiation therapy: Secondary | ICD-10-CM | POA: Diagnosis not present

## 2016-01-01 NOTE — Progress Notes (Signed)
  Radiation Oncology         [phone]   Name: Thomas Guerrero MRN: [id number]   Date: 01/01/2016  DOB: 09/18/1945     Weekly Radiation Therapy Management    ICD-9-CM ICD-10-CM   1. Malignant neoplasm of prostate ([place]) 185 C61     Current Dose: 7.2 Gy  Planned Dose:  45 Gy plus boost  Narrative The patient presents for routine under treatment assessment.  Weight and vitals stable. Denies pain. Reports nocturia x 1. Denies dysuria or hematuria. Describes urine stream as intermittent with occasional difficulty emptying. Denies incontinence or leakage. Denies diarrhea or rectal irritation. Reports fatigue in the late afternoon. Reports occasional a few hours following radiation he feels nauseated. Denies emesis.  The patient is without complaint. Set-up films were reviewed. The chart was checked.  Physical Findings  weight is 190 lb 8 oz (86.41 kg). His blood pressure is 144/53 and his pulse is 63. His respiration is 16 and oxygen saturation is 100%. . Weight essentially stable.  No significant changes.  Impression The patient is tolerating radiation.  Plan Continue treatment as planned.         Sheral Apley Tammi Klippel, M.D.    This document serves as a record of services personally performed by Tyler Pita, MD. It was created on his behalf by Lendon Collar, a trained medical scribe. The creation of this record is based on the scribe's personal observations and the provider's statements to them. This document has been checked and approved by the attending provider.

## 2016-01-01 NOTE — Progress Notes (Signed)
Weight and vitals stable. Denies pain. Reports nocturia x 1. Denies dysuria or hematuria. Describes urine stream as intermittent with occasional difficulty emptying. Denies incontinence or leakage. Denies diarrhea or rectal irritation. Reports fatigue in the late afternoon. Reports occasional a few hours following radiation he feels nauseated. Denies emesis.    BP 144/53 mmHg  Pulse 63  Resp 16  Wt 190 lb 8 oz (86.41 kg)  SpO2 100% Wt Readings from Last 3 Encounters:  01/01/16 190 lb 8 oz (86.41 kg)  09/16/15 192 lb 11.2 oz (87.408 kg)

## 2016-01-05 ENCOUNTER — Ambulatory Visit
Admission: RE | Admit: 2016-01-05 | Discharge: 2016-01-05 | Disposition: A | Discharge status: Home / Self Care | Payer: Medicare Other | Source: Ambulatory Visit | Attending: Radiation Oncology | Admitting: Radiation Oncology

## 2016-01-05 DIAGNOSIS — M109 Gout, unspecified: Secondary | ICD-10-CM | POA: Diagnosis not present

## 2016-01-05 DIAGNOSIS — Z510 Encounter for antineoplastic radiation therapy: Secondary | ICD-10-CM | POA: Diagnosis not present

## 2016-01-05 DIAGNOSIS — C61 Malignant neoplasm of prostate: Secondary | ICD-10-CM | POA: Diagnosis not present

## 2016-01-06 ENCOUNTER — Ambulatory Visit
Admission: RE | Admit: 2016-01-06 | Discharge: 2016-01-06 | Disposition: A | Discharge status: Home / Self Care | Payer: Medicare Other | Source: Ambulatory Visit | Attending: Radiation Oncology | Admitting: Radiation Oncology

## 2016-01-06 DIAGNOSIS — Z510 Encounter for antineoplastic radiation therapy: Secondary | ICD-10-CM | POA: Diagnosis not present

## 2016-01-06 DIAGNOSIS — C61 Malignant neoplasm of prostate: Secondary | ICD-10-CM | POA: Diagnosis not present

## 2016-01-07 ENCOUNTER — Ambulatory Visit
Admission: RE | Admit: 2016-01-07 | Discharge: 2016-01-07 | Disposition: A | Discharge status: Home / Self Care | Payer: Medicare Other | Source: Ambulatory Visit | Attending: Radiation Oncology | Admitting: Radiation Oncology

## 2016-01-07 DIAGNOSIS — Z510 Encounter for antineoplastic radiation therapy: Secondary | ICD-10-CM | POA: Diagnosis not present

## 2016-01-07 DIAGNOSIS — C61 Malignant neoplasm of prostate: Secondary | ICD-10-CM | POA: Diagnosis not present

## 2016-01-08 ENCOUNTER — Ambulatory Visit
Admission: RE | Admit: 2016-01-08 | Discharge: 2016-01-08 | Disposition: A | Discharge status: Home / Self Care | Payer: Medicare Other | Source: Ambulatory Visit | Attending: Radiation Oncology | Admitting: Radiation Oncology

## 2016-01-08 VITALS — BP 140/62 | HR 68 | Temp 97.7°F | Resp 16 | Ht 69.0 in | Wt 188.2 lb

## 2016-01-08 DIAGNOSIS — Z510 Encounter for antineoplastic radiation therapy: Secondary | ICD-10-CM | POA: Diagnosis not present

## 2016-01-08 DIAGNOSIS — C61 Malignant neoplasm of prostate: Secondary | ICD-10-CM

## 2016-01-08 NOTE — Progress Notes (Signed)
Thomas Guerrero has completed 8 fractions to his prostate.  He denies having pain today.  He reports that had a flare of gout in his right knee over the weekend and is now taking prednisone which he will finish on Sunday.  He also took colchicine which caused diarrhea.  He has since stopped taking it.  He also has an abscess on his left buttock.  He reports that he is taking an antibiotic for it but can't remember the name of it.  He said it is healing.  He does have a circular, quarter sized area on his left inner buttock that appears dry.  He keeps it covered with a band-aid.  He denies having dysuria, hematuria, or trouble emptying his bladder.  He reports nocturia 0-1 per night.  He reports having fatigue in the afternoons.  He denies having nausea this week.   BP 140/62 mmHg  Pulse 68  Temp(Src) 97.7 F (36.5 C) (Oral)  Resp 16  Ht $R'5\' 9"'ni$  (1.753 m)  Wt 188 lb 3.2 oz (85.367 kg)  BMI 27.78 kg/m2  SpO2 100%   Wt Readings from Last 3 Encounters:  01/08/16 188 lb 3.2 oz (85.367 kg)  01/01/16 190 lb 8 oz (86.41 kg)  09/16/15 192 lb 11.2 oz (87.408 kg)

## 2016-01-08 NOTE — Progress Notes (Signed)
  Radiation Oncology         [phone]   Name: Thomas Guerrero MRN: [id number]   Date: 01/08/2016  DOB: 1945/10/04     Weekly Radiation Therapy Management    ICD-9-CM ICD-10-CM   1. Malignant neoplasm of prostate ([place]) 185 C61     Current Dose: 14.4 Gy  Planned Dose:  45 Gy plus boost  Narrative The patient presents for routine under treatment assessment.  Gaddiel Cullens has completed 8 fractions to his prostate. He denies having pain today. He reports that had a flare of gout in his right knee over the weekend and is now taking prednisone which he will finish on Sunday. He also took colchicine which caused diarrhea. He has since stopped taking it. He also has an abscess on his left buttock. He reports that he is taking an antibiotic for it but can't remember the name of it. He said it is healing. He does have a circular, quarter sized area on his left inner buttock that appears dry. He keeps it covered with a band-aid. He denies having dysuria, hematuria, or trouble emptying his bladder. He reports nocturia 0-1 per night. He reports having fatigue in the afternoons. He denies having nausea this week.  The patient is without complaint. Set-up films were reviewed. The chart was checked.  Physical Findings  height is $RemoveB'5\' 9"'fEtHoFWH$  (1.753 m) and weight is 188 lb 3.2 oz (85.367 kg). His oral temperature is 97.7 F (36.5 C). His blood pressure is 140/62 and his pulse is 68. His respiration is 16 and oxygen saturation is 100%. . Weight essentially stable.  No significant changes.  Impression The patient is tolerating radiation.  Plan Continue treatment as planned.         Sheral Apley Tammi Klippel, M.D.    This document serves as a record of services personally performed by Tyler Pita, MD. It was created on his behalf by Lendon Collar, a trained medical scribe. The creation of this record is based on the scribe's personal observations and the provider's statements to them. This document has  been checked and approved by the attending provider.

## 2016-01-11 ENCOUNTER — Ambulatory Visit
Admission: RE | Admit: 2016-01-11 | Discharge: 2016-01-11 | Disposition: A | Discharge status: Home / Self Care | Payer: Medicare Other | Source: Ambulatory Visit | Attending: Radiation Oncology | Admitting: Radiation Oncology

## 2016-01-11 DIAGNOSIS — Z510 Encounter for antineoplastic radiation therapy: Secondary | ICD-10-CM | POA: Diagnosis not present

## 2016-01-11 DIAGNOSIS — C61 Malignant neoplasm of prostate: Secondary | ICD-10-CM | POA: Diagnosis not present

## 2016-01-12 ENCOUNTER — Ambulatory Visit
Admission: RE | Admit: 2016-01-12 | Discharge: 2016-01-12 | Disposition: A | Discharge status: Home / Self Care | Payer: Medicare Other | Source: Ambulatory Visit | Attending: Radiation Oncology | Admitting: Radiation Oncology

## 2016-01-12 DIAGNOSIS — C61 Malignant neoplasm of prostate: Secondary | ICD-10-CM | POA: Diagnosis not present

## 2016-01-12 DIAGNOSIS — Z510 Encounter for antineoplastic radiation therapy: Secondary | ICD-10-CM | POA: Diagnosis not present

## 2016-01-13 ENCOUNTER — Ambulatory Visit
Admission: RE | Admit: 2016-01-13 | Discharge: 2016-01-13 | Disposition: A | Discharge status: Home / Self Care | Payer: Medicare Other | Source: Ambulatory Visit | Attending: Radiation Oncology | Admitting: Radiation Oncology

## 2016-01-13 DIAGNOSIS — Z510 Encounter for antineoplastic radiation therapy: Secondary | ICD-10-CM | POA: Diagnosis not present

## 2016-01-13 DIAGNOSIS — C61 Malignant neoplasm of prostate: Secondary | ICD-10-CM | POA: Diagnosis not present

## 2016-01-14 ENCOUNTER — Encounter: Payer: Self-pay | Admitting: Radiation Oncology

## 2016-01-14 ENCOUNTER — Ambulatory Visit
Admission: RE | Admit: 2016-01-14 | Discharge: 2016-01-14 | Disposition: A | Discharge status: Home / Self Care | Payer: Medicare Other | Source: Ambulatory Visit | Attending: Radiation Oncology | Admitting: Radiation Oncology

## 2016-01-14 VITALS — BP 121/58 | HR 64 | Resp 16 | Wt 186.6 lb

## 2016-01-14 DIAGNOSIS — C61 Malignant neoplasm of prostate: Secondary | ICD-10-CM

## 2016-01-14 DIAGNOSIS — Z510 Encounter for antineoplastic radiation therapy: Secondary | ICD-10-CM | POA: Diagnosis not present

## 2016-01-14 NOTE — Progress Notes (Signed)
Weight and vitals stable. Reports right knee pain related to effects of gout. Patient reports he is taking colchicine and prednisone to manage gout. Reports nausea and diarrhea related to effects of colchicine. Reports nocturia x 0-1. Denies dysuria or hematuria. Denies leakage or incontinence. Reports intense fatigue two hours s/p xrt.   BP 121/58 mmHg  Pulse 64  Resp 16  Wt 186 lb 9.6 oz (84.641 kg)  SpO2 100% Wt Readings from Last 3 Encounters:  01/14/16 186 lb 9.6 oz (84.641 kg)  01/08/16 188 lb 3.2 oz (85.367 kg)  01/01/16 190 lb 8 oz (86.41 kg)

## 2016-01-14 NOTE — Progress Notes (Signed)
  Radiation Oncology         [phone]   Name: Thomas Guerrero MRN: [id number]   Date: 01/14/2016  DOB: October 21, 1945     Weekly Radiation Therapy Management    ICD-9-CM ICD-10-CM   1. Prostate cancer ([place]) 185 C61     Current Dose: 21.6 Gy  Planned Dose:  75 Gy  Narrative The patient presents for routine under treatment assessment.  Weight and vitals stable. Reports right knee pain related to the effects of gout. Patient reports he is taking colchicine and prednisone to manage the gout. Reports nausea and diarrhea related to effects of colchicine. Reports nocturia x 0-1. Denies dysuria, hematuria, leakage, or incontinence. Reports intense fatigue two hours s/p xrt.  Set-up films were reviewed. The chart was checked.  Physical Findings  weight is 186 lb 9.6 oz (84.641 kg). His blood pressure is 121/58 and his pulse is 64. His respiration is 16 and oxygen saturation is 100%. . Weight essentially stable.  No significant changes.  Impression The patient is tolerating radiation.  Plan Continue treatment as planned.     Sheral Apley Tammi Klippel, M.D.  This document serves as a record of services personally performed by Tyler Pita, MD. It was created on his behalf by Darcus Austin, a trained medical scribe. The creation of this record is based on the scribe's personal observations and the provider's statements to them. This document has been checked and approved by the attending provider.

## 2016-01-15 ENCOUNTER — Ambulatory Visit
Admission: RE | Admit: 2016-01-15 | Discharge: 2016-01-15 | Disposition: A | Discharge status: Home / Self Care | Payer: Medicare Other | Source: Ambulatory Visit | Attending: Radiation Oncology | Admitting: Radiation Oncology

## 2016-01-15 DIAGNOSIS — Z510 Encounter for antineoplastic radiation therapy: Secondary | ICD-10-CM | POA: Diagnosis not present

## 2016-01-15 DIAGNOSIS — C61 Malignant neoplasm of prostate: Secondary | ICD-10-CM | POA: Diagnosis not present

## 2016-01-18 ENCOUNTER — Ambulatory Visit
Admission: RE | Admit: 2016-01-18 | Discharge: 2016-01-18 | Disposition: A | Discharge status: Home / Self Care | Payer: Medicare Other | Source: Ambulatory Visit | Attending: Radiation Oncology | Admitting: Radiation Oncology

## 2016-01-18 DIAGNOSIS — C61 Malignant neoplasm of prostate: Secondary | ICD-10-CM | POA: Diagnosis not present

## 2016-01-18 DIAGNOSIS — Z510 Encounter for antineoplastic radiation therapy: Secondary | ICD-10-CM | POA: Diagnosis not present

## 2016-01-19 ENCOUNTER — Ambulatory Visit
Admission: RE | Admit: 2016-01-19 | Discharge: 2016-01-19 | Disposition: A | Discharge status: Home / Self Care | Payer: Medicare Other | Source: Ambulatory Visit | Attending: Radiation Oncology | Admitting: Radiation Oncology

## 2016-01-19 DIAGNOSIS — C61 Malignant neoplasm of prostate: Secondary | ICD-10-CM | POA: Diagnosis not present

## 2016-01-19 DIAGNOSIS — Z510 Encounter for antineoplastic radiation therapy: Secondary | ICD-10-CM | POA: Diagnosis not present

## 2016-01-20 ENCOUNTER — Ambulatory Visit
Admission: RE | Admit: 2016-01-20 | Discharge: 2016-01-20 | Disposition: A | Discharge status: Home / Self Care | Payer: Medicare Other | Source: Ambulatory Visit | Attending: Radiation Oncology | Admitting: Radiation Oncology

## 2016-01-20 DIAGNOSIS — Z510 Encounter for antineoplastic radiation therapy: Secondary | ICD-10-CM | POA: Diagnosis not present

## 2016-01-20 DIAGNOSIS — C61 Malignant neoplasm of prostate: Secondary | ICD-10-CM | POA: Diagnosis not present

## 2016-01-21 ENCOUNTER — Encounter: Payer: Self-pay | Admitting: Medical Oncology

## 2016-01-21 ENCOUNTER — Ambulatory Visit
Admission: RE | Admit: 2016-01-21 | Discharge: 2016-01-21 | Disposition: A | Discharge status: Home / Self Care | Payer: Medicare Other | Source: Ambulatory Visit | Attending: Radiation Oncology | Admitting: Radiation Oncology

## 2016-01-21 DIAGNOSIS — C61 Malignant neoplasm of prostate: Secondary | ICD-10-CM | POA: Diagnosis not present

## 2016-01-21 NOTE — Progress Notes (Signed)
Oncology Nurse Navigator Documentation  Oncology Nurse Navigator Flowsheets 12/29/2015 12/30/2015 01/21/2016  Navigator Location - - -  Navigator Encounter Type Treatment Treatment Treatment  Telephone - - -  Abnormal Finding Date - - -  Confirmed Diagnosis Date - - -  Treatment Initiated Date 12/29/2015 - -  Patient Visit Type - RadOnc RadOnc  Treatment Phase First Radiation Tx Treatment Treatment  Barriers/Navigation Needs - No barriers at this time;Education Education  Education - Pain/ Symptom Management Pain/ Symptom Management- Thomas Guerrero states he is half way thru his radiation treatments. He has had occasional diarrhea and we discussed imodium. His main side effect [place] fatigue. About 2 hours post treatment it comes on. He continues to work and his job requires him to work outside in the [place]. We discussed how the heat may make him feel even more tired. He states that he is taking rest periods and getting out of the heat in the cool. I suggested he stay hydrated and take frequent rest. He voiced understanding. I asked him to call me with any questions or concerns.  Interventions - Education Method Education Method  Coordination of Care - - -  Education Method - Verbal;Written Teach-back;Verbal  Support Groups/Services - Friends and Family Friends and Family  Acuity - - -  Acuity Level 1 - - -  Acuity Level 2 - - -  Time Spent with Patient - 92 42

## 2016-01-22 ENCOUNTER — Encounter: Payer: Self-pay | Admitting: Radiation Oncology

## 2016-01-22 ENCOUNTER — Ambulatory Visit
Admission: RE | Admit: 2016-01-22 | Discharge: 2016-01-22 | Disposition: A | Discharge status: Home / Self Care | Payer: Medicare Other | Source: Ambulatory Visit | Attending: Radiation Oncology | Admitting: Radiation Oncology

## 2016-01-22 VITALS — BP 129/63 | HR 68 | Resp 16 | Wt 184.3 lb

## 2016-01-22 DIAGNOSIS — Z79899 Other long term (current) drug therapy: Secondary | ICD-10-CM | POA: Diagnosis not present

## 2016-01-22 DIAGNOSIS — M25561 Pain in right knee: Secondary | ICD-10-CM | POA: Diagnosis not present

## 2016-01-22 DIAGNOSIS — Z923 Personal history of irradiation: Secondary | ICD-10-CM | POA: Insufficient documentation

## 2016-01-22 DIAGNOSIS — M109 Gout, unspecified: Secondary | ICD-10-CM | POA: Insufficient documentation

## 2016-01-22 DIAGNOSIS — C61 Malignant neoplasm of prostate: Secondary | ICD-10-CM | POA: Diagnosis not present

## 2016-01-22 NOTE — Progress Notes (Signed)
   Department of Radiation Oncology  Phone:  [phone] Fax:        [phone]  Weekly Treatment Note    Name: Thomas Guerrero Date: 01/23/2016 MRN: [id number] DOB: 1946/02/17   Diagnosis:     ICD-9-CM ICD-10-CM   1. Malignant neoplasm of prostate ([place]) 185 C61      Current dose: 32.4 Gy  Current fraction:18   MEDICATIONS: Current Outpatient Prescriptions  Medication Sig Dispense Refill  . benazepril (LOTENSIN) 20 MG tablet   1  . calcium carbonate (TUMS - DOSED IN MG ELEMENTAL CALCIUM) 500 MG chewable tablet Chew 1 tablet by mouth daily.    . cholecalciferol (VITAMIN D) 1000 units tablet Take 1,000 Units by mouth daily.    . colchicine 0.6 MG tablet Take 0.6 mg by mouth daily. Reported on 01/08/2016    . famotidine (PEPCID) 10 MG tablet Take 10 mg by mouth 2 (two) times daily. Reported on 09/18/2015    . Misc Natural Products (TART CHERRY ADVANCED PO) Take by mouth. Reported on 01/08/2016    . Omega-3 Fatty Acids (FISH OIL) 1000 MG CAPS Take by mouth. Reported on 01/08/2016    . predniSONE (STERAPRED UNI-PAK 21 TAB) 10 MG (21) TBPK tablet TAKE TABLETS BY MOUTH IN THESE TAPERED DOSES ONCE A DAY: 6,5,4,3,2,1  0  . vitamin B-12 (CYANOCOBALAMIN) 1000 MCG tablet Take 1,000 mcg by mouth daily. Reported on 09/18/2015     No current facility-administered medications for this encounter.     ALLERGIES: Review of patient's allergies indicates no known allergies.   LABORATORY DATA:  No results found for: WBC, HGB, HCT, MCV, PLT No results found for: NA, K, CL, CO2 No results found for: ALT, AST, GGT, ALKPHOS, BILITOT   NARRATIVE: Leamon Palau was seen today for weekly treatment management. The chart was checked and the patient's films were reviewed.  Weight and vitals stable. Reports right knee pain related to gout that has improved, nausea persisting despite no longer taking colchicine, nocturia x 1, managing diarrhea with Imodium, and intense fatigue in the late afternoon.  He denies dysuria, hematuria, leakage, or incontinence.  PHYSICAL EXAMINATION: weight is 184 lb 4.8 oz (83.598 kg). His blood pressure is 129/63 and his pulse is 68. His respiration is 16 and oxygen saturation is 100%.   ASSESSMENT: The patient is doing satisfactorily with treatment.  PLAN: We will continue with the patient's radiation treatment as planned.  ------------------------------------------------  Jodelle Gross, MD, PhD  This document serves as a record of services personally performed by Kyung Rudd, MD. It was created on his behalf by Darcus Austin, a trained medical scribe. The creation of this record is based on the scribe's personal observations and the provider's statements to them. This document has been checked and approved by the attending provider.

## 2016-01-22 NOTE — Progress Notes (Signed)
Weight and vitals stable. Reports right knee pain related to gout has improved. Reports nausea persist despite no longer taking colchicine. Reports nocturia x 1. Denies dysuria or hematuria. Denies leakage or incontinence. Reports managing diarrhea with Imodium. Reports intense fatigue late afternoon.   BP 129/63 mmHg  Pulse 68  Resp 16  Wt 184 lb 4.8 oz (83.598 kg)  SpO2 100% Wt Readings from Last 3 Encounters:  01/22/16 184 lb 4.8 oz (83.598 kg)  01/14/16 186 lb 9.6 oz (84.641 kg)  01/08/16 188 lb 3.2 oz (85.367 kg)

## 2016-01-25 ENCOUNTER — Ambulatory Visit
Admission: RE | Admit: 2016-01-25 | Discharge: 2016-01-25 | Disposition: A | Discharge status: Home / Self Care | Payer: Medicare Other | Source: Ambulatory Visit | Attending: Radiation Oncology | Admitting: Radiation Oncology

## 2016-01-25 DIAGNOSIS — C61 Malignant neoplasm of prostate: Secondary | ICD-10-CM | POA: Diagnosis not present

## 2016-01-26 ENCOUNTER — Ambulatory Visit
Admission: RE | Admit: 2016-01-26 | Discharge: 2016-01-26 | Disposition: A | Discharge status: Home / Self Care | Payer: Medicare Other | Source: Ambulatory Visit | Attending: Radiation Oncology | Admitting: Radiation Oncology

## 2016-01-26 DIAGNOSIS — C61 Malignant neoplasm of prostate: Secondary | ICD-10-CM | POA: Diagnosis not present

## 2016-01-27 ENCOUNTER — Ambulatory Visit
Admission: RE | Admit: 2016-01-27 | Discharge: 2016-01-27 | Disposition: A | Discharge status: Home / Self Care | Payer: Medicare Other | Source: Ambulatory Visit | Attending: Radiation Oncology | Admitting: Radiation Oncology

## 2016-01-27 DIAGNOSIS — C61 Malignant neoplasm of prostate: Secondary | ICD-10-CM | POA: Diagnosis not present

## 2016-01-28 ENCOUNTER — Ambulatory Visit
Admission: RE | Admit: 2016-01-28 | Discharge: 2016-01-28 | Disposition: A | Discharge status: Home / Self Care | Payer: Medicare Other | Source: Ambulatory Visit | Attending: Radiation Oncology | Admitting: Radiation Oncology

## 2016-01-28 DIAGNOSIS — C61 Malignant neoplasm of prostate: Secondary | ICD-10-CM | POA: Diagnosis not present

## 2016-01-29 ENCOUNTER — Ambulatory Visit
Admission: RE | Admit: 2016-01-29 | Discharge: 2016-01-29 | Disposition: A | Discharge status: Home / Self Care | Payer: Medicare Other | Source: Ambulatory Visit | Attending: Radiation Oncology | Admitting: Radiation Oncology

## 2016-01-29 VITALS — BP 123/63 | HR 51 | Resp 16 | Wt 184.6 lb

## 2016-01-29 DIAGNOSIS — C61 Malignant neoplasm of prostate: Secondary | ICD-10-CM | POA: Diagnosis not present

## 2016-01-29 NOTE — Progress Notes (Signed)
Weight and vitals stable. Denies pain. Reports nocturia x 1. Denies dysuria, hematuria, urgency or frequency. Describes a strong steady urine stream without difficulty emptying. Denies leakage or incontinence. Managing diarrhea with Imodium. Reports intense fatigue late afternoon.  BP 123/63 mmHg  Pulse 51  Resp 16  Wt 184 lb 9.6 oz (83.734 kg)  SpO2 100% Wt Readings from Last 3 Encounters:  01/29/16 184 lb 9.6 oz (83.734 kg)  01/22/16 184 lb 4.8 oz (83.598 kg)  01/14/16 186 lb 9.6 oz (84.641 kg)

## 2016-01-29 NOTE — Progress Notes (Signed)
  Radiation Oncology         [phone]   Name: Thomas Guerrero MRN: [id number]   Date: 01/29/2016  DOB: 1946/01/02     Weekly Radiation Therapy Management    ICD-9-CM ICD-10-CM   1. Prostate cancer ([place]) 185 C61     Current Dose: 39.6 Gy  Planned Dose:  75 Gy  Narrative The patient presents for routine under treatment assessment.  Denies pain, dysuria, hematuria, urgency, frequency, leakage, or incontinence. Reports nocturia x 1. Describes a strong steady urine stream without difficulty emptying. Managing diarrhea with Imodium. Reports intense fatigue late afternoon.  Set-up films were reviewed. The chart was checked.  Physical Findings  weight is 184 lb 9.6 oz (83.734 kg). His blood pressure is 123/63 and his pulse is 51. His respiration is 16 and oxygen saturation is 100%. . Weight essentially stable.  No significant changes.  Impression The patient is tolerating radiation.  Plan Continue treatment as planned.     Sheral Apley Tammi Klippel, M.D.  This document serves as a record of services personally performed by Tyler Pita, MD. It was created on his behalf by Darcus Austin, a trained medical scribe. The creation of this record is based on the scribe's personal observations and the provider's statements to them. This document has been checked and approved by the attending provider.

## 2016-02-01 ENCOUNTER — Ambulatory Visit
Admission: RE | Admit: 2016-02-01 | Discharge: 2016-02-01 | Disposition: A | Discharge status: Home / Self Care | Payer: Medicare Other | Source: Ambulatory Visit | Attending: Radiation Oncology | Admitting: Radiation Oncology

## 2016-02-01 DIAGNOSIS — C61 Malignant neoplasm of prostate: Secondary | ICD-10-CM | POA: Diagnosis not present

## 2016-02-02 ENCOUNTER — Ambulatory Visit
Admission: RE | Admit: 2016-02-02 | Discharge: 2016-02-02 | Disposition: A | Discharge status: Home / Self Care | Payer: Medicare Other | Source: Ambulatory Visit | Attending: Radiation Oncology | Admitting: Radiation Oncology

## 2016-02-02 DIAGNOSIS — C61 Malignant neoplasm of prostate: Secondary | ICD-10-CM | POA: Diagnosis not present

## 2016-02-03 ENCOUNTER — Ambulatory Visit
Admission: RE | Admit: 2016-02-03 | Discharge: 2016-02-03 | Disposition: A | Discharge status: Home / Self Care | Payer: Medicare Other | Source: Ambulatory Visit | Attending: Radiation Oncology | Admitting: Radiation Oncology

## 2016-02-03 DIAGNOSIS — C61 Malignant neoplasm of prostate: Secondary | ICD-10-CM | POA: Diagnosis not present

## 2016-02-04 ENCOUNTER — Ambulatory Visit
Admission: RE | Admit: 2016-02-04 | Discharge: 2016-02-04 | Disposition: A | Discharge status: Home / Self Care | Payer: Medicare Other | Source: Ambulatory Visit | Attending: Radiation Oncology | Admitting: Radiation Oncology

## 2016-02-04 VITALS — BP 152/72 | HR 76 | Resp 16 | Wt 183.1 lb

## 2016-02-04 DIAGNOSIS — C61 Malignant neoplasm of prostate: Secondary | ICD-10-CM | POA: Diagnosis not present

## 2016-02-04 NOTE — Progress Notes (Signed)
Reports nocturia x 1. Denies dysuria, hematuria, urgency or frequency. Describes a strong steady urine stream without difficulty emptying. Denies leakage or incontinence. Managing diarrhea with Imodium. Reports he had an episode of nausea, vomiting, and diarrhea yesterday such that he was unable to work. Reports intense fatigue late afternoon.  BP 152/72 mmHg  Pulse 76  Resp 16  Wt 183 lb 1.6 oz (83.054 kg)  SpO2 100% Wt Readings from Last 3 Encounters:  02/04/16 183 lb 1.6 oz (83.054 kg)  01/29/16 184 lb 9.6 oz (83.734 kg)  01/22/16 184 lb 4.8 oz (83.598 kg)

## 2016-02-04 NOTE — Progress Notes (Signed)
  Radiation Oncology         [phone]   Name: Thomas Guerrero MRN: [id number]   Date: 02/04/2016  DOB: 13-Oct-1945     Weekly Radiation Therapy Management    ICD-9-CM ICD-10-CM   1. Malignant neoplasm of prostate ([facility]) 185 C61     Current Dose: 49 Gy  Planned Dose:  75 Gy  Narrative The patient presents for routine under treatment assessment.  Denies pain, dysuria, hematuria, urgency, frequency, leakage, or incontinence. Reports nocturia x 1. Describes a strong steady urine stream without difficulty emptying. Managing diarrhea with Imodium. Reports intense fatigue late afternoon.  Set-up films were reviewed. The chart was checked.  Physical Findings  weight is 183 lb 1.6 oz (83.054 kg). His blood pressure is 152/72 and his pulse is 76. His respiration is 16 and oxygen saturation is 100%. . Weight essentially stable.  No significant changes. Patient reports that he had severe direrrea yesterday morning coupled with nasea, and he treated it with imodium which helps to alleviate the symptoms.  Whenever he has severe bowel movements, he reports that he will defecate 5-7 times a day. He denies blood in the stool.   Impression The patient is tolerating radiation.  Plan Continue treatment as planned.     Sheral Apley Tammi Klippel, M.D.  This document serves as a record of services personally performed by Tyler Pita, MD. It was created on his behalf by Truddie Hidden, a trained medical scribe. The creation of this record is based on the scribe's personal observations and the provider's statements to them. This document has been checked and approved by the attending provider.

## 2016-02-05 ENCOUNTER — Ambulatory Visit
Admission: RE | Admit: 2016-02-05 | Discharge: 2016-02-05 | Disposition: A | Discharge status: Home / Self Care | Payer: Medicare Other | Source: Ambulatory Visit | Attending: Radiation Oncology | Admitting: Radiation Oncology

## 2016-02-05 DIAGNOSIS — C61 Malignant neoplasm of prostate: Secondary | ICD-10-CM | POA: Diagnosis not present

## 2016-02-08 ENCOUNTER — Encounter: Payer: Self-pay | Admitting: Medical Oncology

## 2016-02-08 ENCOUNTER — Ambulatory Visit
Admission: RE | Admit: 2016-02-08 | Discharge: 2016-02-08 | Disposition: A | Discharge status: Home / Self Care | Payer: Medicare Other | Source: Ambulatory Visit | Attending: Radiation Oncology | Admitting: Radiation Oncology

## 2016-02-08 DIAGNOSIS — C61 Malignant neoplasm of prostate: Secondary | ICD-10-CM | POA: Diagnosis not present

## 2016-02-08 NOTE — Progress Notes (Signed)
Oncology Nurse Navigator Documentation  Oncology Nurse Navigator Flowsheets 12/30/2015 01/21/2016 02/08/2016  Navigator Location - - -  Navigator Encounter Type Treatment Treatment Treatment  Telephone - - -  Abnormal Finding Date - - -  Confirmed Diagnosis Date - - -  Treatment Initiated Date - - -  Patient Visit Type RadOnc RadOnc RadOnc  Treatment Phase Treatment Treatment Treatment- Mr. Fikes states he is tolerating his radiation well. He had an episode of nausea/vomiting and diarrhea last Thursday and Friday. Today he is feeling much better so he thinks he might have been a virus. He has had some diarrhea but has treated it with imodium. I will continue to follow and asked him to call me with any questions or concerns.  Barriers/Navigation Needs No barriers at this time;Education Education No barriers at this time  Education Pain/ Symptom Management Pain/ Symptom Management -  Interventions Education Method Education Method -  Coordination of Care - - -  Education Method Verbal;Written Teach-back;Verbal -  Support Groups/Services Friends and Family Friends and Family Friends and Family  Acuity - - -  Acuity Level 1 - - -  Acuity Level 2 - - -  Time Spent with Patient 64 33 29

## 2016-02-10 ENCOUNTER — Ambulatory Visit
Admission: RE | Admit: 2016-02-10 | Discharge: 2016-02-10 | Disposition: A | Discharge status: Home / Self Care | Payer: Medicare Other | Source: Ambulatory Visit | Attending: Radiation Oncology | Admitting: Radiation Oncology

## 2016-02-10 DIAGNOSIS — C61 Malignant neoplasm of prostate: Secondary | ICD-10-CM | POA: Diagnosis not present

## 2016-02-11 ENCOUNTER — Ambulatory Visit
Admission: RE | Admit: 2016-02-11 | Discharge: 2016-02-11 | Disposition: A | Discharge status: Home / Self Care | Payer: Medicare Other | Source: Ambulatory Visit | Attending: Radiation Oncology | Admitting: Radiation Oncology

## 2016-02-11 DIAGNOSIS — C61 Malignant neoplasm of prostate: Secondary | ICD-10-CM | POA: Diagnosis not present

## 2016-02-12 ENCOUNTER — Ambulatory Visit
Admission: RE | Admit: 2016-02-12 | Discharge: 2016-02-12 | Disposition: A | Discharge status: Home / Self Care | Payer: Medicare Other | Source: Ambulatory Visit | Attending: Radiation Oncology | Admitting: Radiation Oncology

## 2016-02-12 ENCOUNTER — Encounter: Payer: Self-pay | Admitting: Radiation Oncology

## 2016-02-12 VITALS — BP 125/58 | HR 57 | Resp 16 | Wt 177.1 lb

## 2016-02-12 DIAGNOSIS — C61 Malignant neoplasm of prostate: Secondary | ICD-10-CM | POA: Diagnosis not present

## 2016-02-12 NOTE — Progress Notes (Signed)
   Department of Radiation Oncology  Phone:  [phone] Fax:        [phone]  Weekly Treatment Note    Name: Thomas Guerrero Date: 02/12/2016 MRN: [id number] DOB: [birth year]   Diagnosis:     ICD-9-CM ICD-10-CM   1. 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 benazepril (LOTENSIN) 20 MG tablet   1  . calcium carbonate (TUMS - DOSED IN MG ELEMENTAL CALCIUM) 500 MG chewable tablet Chew 1 tablet by mouth daily.    . cholecalciferol (VITAMIN D) 1000 units tablet Take 1,000 Units by mouth daily.    . colchicine 0.6 MG tablet Take 0.6 mg by mouth daily. Reported on 01/08/2016    . famotidine (PEPCID) 10 MG tablet Take 10 mg by mouth 2 (two) times daily. Reported on 09/18/2015    . Misc Natural Products (TART CHERRY ADVANCED PO) Take by mouth. Reported on 01/08/2016    . Omega-3 Fatty Acids (FISH OIL) 1000 MG CAPS Take by mouth. Reported on 01/08/2016    . predniSONE (STERAPRED UNI-PAK 21 TAB) 10 MG (21) TBPK tablet TAKE TABLETS BY MOUTH IN THESE TAPERED DOSES ONCE A DAY: 6,5,4,3,2,1  0  . vitamin B-12 (CYANOCOBALAMIN) 1000 MCG tablet Take 1,000 mcg by mouth daily. Reported on 09/18/2015     No current facility-administered medications for this encounter.     ALLERGIES: Review of patient's allergies indicates no known allergies.   LABORATORY DATA:  No results found for: WBC, HGB, HCT, MCV, PLT No results found for: NA, K, CL, CO2 No results found for: ALT, AST, GGT, ALKPHOS, BILITOT   NARRATIVE: Thomas Guerrero was seen today for weekly treatment management. The chart was checked and the patient's films were reviewed.  Vitals stable. Five pound weight loss noted. Reports a poor appetite. Reports soreness below last left rib and to left groin. This pain occurs with movement. He denies any unusual lifting or movement Reports nausea is less with  boost. Denies dysuria, hematuria, frequency, or urgency. Describes a strong steady urine stream without difficulty emptying. Denies leakage or incontinence. Reports nocturia x 1. Managing diarrhea with Imodium. Reports fatigue persist.    PHYSICAL EXAMINATION: weight is 177 lb 1.6 oz (80.332 kg). His blood pressure is 125/58 and his pulse is 57. His respiration is 16 and oxygen saturation is 100%.   Alert. In no acute distress.   ASSESSMENT: The patient is doing satisfactorily with treatment.  PLAN: We will continue with the patient's radiation treatment as planned.  ------------------------------------------------  Jodelle Gross, MD, PhD  This document serves as a record of services personally performed by Kyung Rudd, MD. It was created on his behalf by Arlyce Harman, a trained medical scribe. The creation of this record is based on the scribe's personal observations and the provider's statements to them. This document has been checked and approved by the attending provider.

## 2016-02-12 NOTE — Progress Notes (Addendum)
Vitals stable. Five pound weight loss noted. Reports a poor appetite. Reports soreness below last left rib and to left groin. Reports nausea is less with boost. Denies dysuria, hematuria, frequency, or urgency. Describes a strong steady urine stream without difficulty emptying. Denies leakage or incontinence. Reports nocturia x 1. Managing diarrhea with Imodium. Reports fatigue persist.   BP 125/58 mmHg  Pulse 57  Resp 16  Wt 177 lb 1.6 oz (80.332 kg)  SpO2 100% Wt Readings from Last 3 Encounters:  02/12/16 177 lb 1.6 oz (80.332 kg)  02/04/16 183 lb 1.6 oz (83.054 kg)  01/29/16 184 lb 9.6 oz (83.734 kg)

## 2016-02-15 ENCOUNTER — Ambulatory Visit
Admission: RE | Admit: 2016-02-15 | Discharge: 2016-02-15 | Disposition: A | Discharge status: Home / Self Care | Payer: Medicare Other | Source: Ambulatory Visit | Attending: Radiation Oncology | Admitting: Radiation Oncology

## 2016-02-15 DIAGNOSIS — C61 Malignant neoplasm of prostate: Secondary | ICD-10-CM | POA: Diagnosis not present

## 2016-02-15 DIAGNOSIS — N481 Balanitis: Secondary | ICD-10-CM | POA: Diagnosis not present

## 2016-02-16 ENCOUNTER — Ambulatory Visit
Admission: RE | Admit: 2016-02-16 | Discharge: 2016-02-16 | Disposition: A | Discharge status: Home / Self Care | Payer: Medicare Other | Source: Ambulatory Visit | Attending: Radiation Oncology | Admitting: Radiation Oncology

## 2016-02-16 DIAGNOSIS — C61 Malignant neoplasm of prostate: Secondary | ICD-10-CM | POA: Diagnosis not present

## 2016-02-17 ENCOUNTER — Ambulatory Visit
Admission: RE | Admit: 2016-02-17 | Discharge: 2016-02-17 | Disposition: A | Discharge status: Home / Self Care | Payer: Medicare Other | Source: Ambulatory Visit | Attending: Radiation Oncology | Admitting: Radiation Oncology

## 2016-02-17 DIAGNOSIS — C61 Malignant neoplasm of prostate: Secondary | ICD-10-CM | POA: Diagnosis not present

## 2016-02-18 ENCOUNTER — Encounter: Payer: Self-pay | Admitting: Medical Oncology

## 2016-02-18 ENCOUNTER — Ambulatory Visit
Admission: RE | Admit: 2016-02-18 | Discharge: 2016-02-18 | Disposition: A | Discharge status: Home / Self Care | Payer: Medicare Other | Source: Ambulatory Visit | Attending: Radiation Oncology | Admitting: Radiation Oncology

## 2016-02-18 VITALS — BP 121/65 | HR 58 | Resp 16 | Wt 177.6 lb

## 2016-02-18 DIAGNOSIS — C61 Malignant neoplasm of prostate: Secondary | ICD-10-CM

## 2016-02-18 NOTE — Progress Notes (Signed)
Oncology Nurse Navigator Documentation  Oncology Nurse Navigator Flowsheets 01/21/2016 02/08/2016 02/18/2016  Navigator Location - - -  Navigator Encounter Type Treatment Treatment Treatment  Telephone - - -  Abnormal Finding Date - - -  Confirmed Diagnosis Date - - -  Treatment Initiated Date - - -  Patient Visit Type RadOnc RadOnc RadOnc  Treatment Phase Treatment Treatment Treatment- Main side effect is fatigue. No issues with urination. He has 4  treatments remaining. He has follow up appointment with Dr. Tammi Klippel 03/31/16.   Barriers/Navigation Needs Education No barriers at this time No barriers at this time  Education Pain/ Symptom Management - -  Interventions Education Method - -  Coordination of Care - - -  Education Method Teach-back;Verbal - -  Support Groups/Services Friends and Family Friends and Family Friends and Family  Acuity - - -  Acuity Level 1 - - -  Acuity Level 2 - - -  Time Spent with Patient 59 56 38

## 2016-02-18 NOTE — Progress Notes (Addendum)
Weight and vitals stable. Denies pain. Reports less nausea and increased appetite. Report soreness from left rib to left groin is less. Denies dysuria, hematuria, frequency, or urgency. Describes a strong steady urine stream without difficulty emptying. Denies leakage or incontinence. Reports nocturia x 1. Denies any diarrhea this week. Reports fatigue. One month follow up appointment card given. Patient understands to contact this RN with future needs.  BP 121/65 mmHg  Pulse 58  Resp 16  Wt 177 lb 9.6 oz (80.559 kg)  SpO2 100% Wt Readings from Last 3 Encounters:  02/18/16 177 lb 9.6 oz (80.559 kg)  02/12/16 177 lb 1.6 oz (80.332 kg)  02/04/16 183 lb 1.6 oz (83.054 kg)

## 2016-02-18 NOTE — Progress Notes (Signed)
  Radiation Oncology         [phone]   Name: Thomas Guerrero MRN: [id number]   Date: 02/18/2016  DOB: [birth year]     Weekly Radiation Therapy Management    Current Dose: 67 Gy  Planned Dose:  75 Gy  Narrative The patient presents for routine under treatment assessment.  Weight and vitals stable. Denies pain. Reports less nausea and increased appetite. Report soreness from left rib to left groin is less. Denies dysuria, hematuria, frequency, or urgency. Describes a strong steady urine stream without difficulty emptying. Denies leakage or incontinence. Reports nocturia x 1. Denies any diarrhea this week. Reports fatigue. One month follow up appointment card given. Patient understands to contact this RN with future needs.  Set-up films were reviewed. The chart was checked.  Physical Findings  weight is 177 lb 9.6 oz (80.559 kg). His blood pressure is 121/65 and his pulse is 58. His respiration is 16 and oxygen saturation is 100%. . Weight essentially stable.  No significant changes.   Impression The patient is tolerating radiation.  Plan Continue treatment as planned. After end of treatment, he will see Dr. Tammi Klippel in one month for follow up.      Sheral Apley Tammi Klippel, M.D.  This document serves as a record of services personally performed by Tyler Pita, MD. It was created on his behalf by Truddie Hidden, a trained medical scribe. The creation of this record is based on the scribe's personal observations and the provider's statements to them. This document has been checked and approved by the attending provider.

## 2016-02-19 ENCOUNTER — Ambulatory Visit
Admission: RE | Admit: 2016-02-19 | Discharge: 2016-02-19 | Disposition: A | Discharge status: Home / Self Care | Payer: Medicare Other | Source: Ambulatory Visit | Attending: Radiation Oncology | Admitting: Radiation Oncology

## 2016-02-19 DIAGNOSIS — C61 Malignant neoplasm of prostate: Secondary | ICD-10-CM | POA: Diagnosis not present

## 2016-02-22 ENCOUNTER — Ambulatory Visit
Admission: RE | Admit: 2016-02-22 | Discharge: 2016-02-22 | Disposition: A | Discharge status: Home / Self Care | Payer: Medicare Other | Source: Ambulatory Visit | Attending: Radiation Oncology | Admitting: Radiation Oncology

## 2016-02-22 DIAGNOSIS — C61 Malignant neoplasm of prostate: Secondary | ICD-10-CM | POA: Diagnosis not present

## 2016-02-23 ENCOUNTER — Ambulatory Visit
Admission: RE | Admit: 2016-02-23 | Discharge: 2016-02-23 | Disposition: A | Discharge status: Home / Self Care | Payer: Medicare Other | Source: Ambulatory Visit | Attending: Radiation Oncology | Admitting: Radiation Oncology

## 2016-02-23 ENCOUNTER — Encounter: Payer: Self-pay | Admitting: Medical Oncology

## 2016-02-23 DIAGNOSIS — C61 Malignant neoplasm of prostate: Secondary | ICD-10-CM | POA: Diagnosis not present

## 2016-02-23 NOTE — Progress Notes (Signed)
Oncology Nurse Navigator Documentation  Oncology Nurse Navigator Flowsheets 02/08/2016 02/18/2016 02/23/2016  Navigator Encounter Type Treatment Treatment Treatment  Telephone - - -  Treatment Initiated Date - - -  Patient Visit Type RadOnc RadOnc RadOnc  Treatment Phase Treatment Treatment Treatment  Barriers/Navigation Needs No barriers at this time No barriers at this time Education  Education - - Pain/ Symptom Management- Thomas Guerrero complains of fatigue. Yesterday he was out in the heat and almost passed out. I stressed he should stay of the heat if possible and to keep hydrated.We discussed once he completes treatments that he will begin to regain his energy.  He voiced understanding. He will complete his radiation treatments tomorrow.  Interventions - - Education Method  Coordination of Care - - -  Education Method - - Teach-back;Verbal  Support Groups/Services Friends and Family Friends and Family Friends and Family  Acuity - - -  Acuity Level 1 - - -  Acuity Level 2 - - -  Time Spent with Patient 66 06 00

## 2016-02-24 ENCOUNTER — Encounter: Payer: Self-pay | Admitting: Radiation Oncology

## 2016-02-24 ENCOUNTER — Encounter: Payer: Self-pay | Admitting: Medical Oncology

## 2016-02-24 ENCOUNTER — Ambulatory Visit
Admission: RE | Admit: 2016-02-24 | Discharge: 2016-02-24 | Disposition: A | Discharge status: Home / Self Care | Payer: Medicare Other | Source: Ambulatory Visit | Attending: Radiation Oncology | Admitting: Radiation Oncology

## 2016-02-24 DIAGNOSIS — C61 Malignant neoplasm of prostate: Secondary | ICD-10-CM | POA: Diagnosis not present

## 2016-02-24 NOTE — Progress Notes (Signed)
Oncology Nurse Navigator Documentation  Oncology Nurse Navigator Flowsheets 02/18/2016 02/23/2016 02/24/2016  Navigator Encounter Type Treatment Treatment Treatment  Telephone - - -  Treatment Initiated Date - - -  Patient Visit Type RadOnc RadOnc RadOnc  Treatment Phase Treatment Treatment Final Radiation Tx- Celebrated with Mr. Juhasz as he rang the bell after completion of radiation treatments. He has follow up appointment with Shona Simpson, PA 8/24. I asked him to call me with any questions or concerns. He voiced understanding.  Barriers/Navigation Needs No barriers at this time Education -  Education - Pain/ Symptom Management -  Interventions - Education Method -  Education Method - Teach-back;Verbal -  Support Groups/Services Friends and Family Friends and Family -  Acuity - - -  Acuity Level 1 - - -  Acuity Level 2 - - -  Time Spent with Patient 15 15 30

## 2016-02-25 NOTE — Progress Notes (Signed)
  Radiation Oncology         [phone]) [phone] ________________________________  Name: Thomas Guerrero MRN: [id number]  Date: 02/24/2016  DOB: Jul 04, 1946  End of Treatment Note  Diagnosis: 70 y.o. 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 The prostate, seminal vesicles, and pelvic lymph nodes were initially treated to 45 Gy in 25 fractions of 1.8 Gy  2. The prostate only was boosted to 75 Gy with 15 additional fractions of 2.0 Gy   Beams/energy:  1. The prostate, seminal vesicles, and pelvic lymph nodes were initially treated using VMAT intensity modulated radiotherapy delivering 6 megavolt photons. Image guidance was performed with CB-CT studies prior to each fraction. He was immobilized with a body fix lower extremity mold.  2. the prostate only was boosted using VMAT intensity modulated radiotherapy delivering 6 megavolt photons. Image guidance was performed with CB-CT studies prior to each fraction. He was immobilized with a body fix lower extremity mold.  Narrative: The patient tolerated radiation treatment relatively well.   The patient experienced some minor urinary irritation and modest fatigue. Main complaints were poor appetite, nausea, nocturia x1, and diarrhea.  Plan: The patient has completed radiation treatment. He will return to radiation oncology clinic for routine followup in one month. I advised him to call or return sooner if he has any questions or concerns related to his recovery or treatment. ________________________________  Sheral Apley. Tammi Klippel, M.D.  This document serves as a record of services personally performed by Tyler Pita, MD. It was created on his behalf by Darcus Austin, a trained medical scribe. The creation of this record is based on the scribe's personal  observations and the provider's statements to them. This document has been checked and approved by the attending provider.

## 2016-02-29 DIAGNOSIS — I129 Hypertensive chronic kidney disease with stage 1 through stage 4 chronic kidney disease, or unspecified chronic kidney disease: Secondary | ICD-10-CM | POA: Diagnosis not present

## 2016-02-29 DIAGNOSIS — C61 Malignant neoplasm of prostate: Secondary | ICD-10-CM | POA: Diagnosis not present

## 2016-02-29 DIAGNOSIS — N183 Chronic kidney disease, stage 3 (moderate): Secondary | ICD-10-CM | POA: Diagnosis not present

## 2016-03-23 DIAGNOSIS — N481 Balanitis: Secondary | ICD-10-CM | POA: Diagnosis not present

## 2016-03-29 ENCOUNTER — Ambulatory Visit
Admission: RE | Admit: 2016-03-29 | Discharge: 2016-03-29 | Disposition: A | Discharge status: Home / Self Care | Payer: Medicare Other | Source: Ambulatory Visit | Attending: Radiation Oncology | Admitting: Radiation Oncology

## 2016-03-29 ENCOUNTER — Encounter: Payer: Self-pay | Admitting: Radiation Oncology

## 2016-03-29 VITALS — BP 132/68 | HR 70 | Temp 98.2°F | Resp 18 | Ht 69.0 in | Wt 176.3 lb

## 2016-03-29 DIAGNOSIS — C61 Malignant neoplasm of prostate: Secondary | ICD-10-CM | POA: Diagnosis not present

## 2016-03-29 DIAGNOSIS — M109 Gout, unspecified: Secondary | ICD-10-CM | POA: Insufficient documentation

## 2016-03-29 DIAGNOSIS — R21 Rash and other nonspecific skin eruption: Secondary | ICD-10-CM

## 2016-03-29 NOTE — Progress Notes (Addendum)
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 The prostate, seminal vesicles, and pelvic lymph nodes were initially treated to 45 Gy in 25 fractions of 1.8 Gy  2. The prostate only was boosted to 75 Gy with 15 additional fractions of 2.0 Gy   Narrative:  The patient returns today for routine follow-up.  During the course of radiation he tolerated treatment, he had modest fatigue and some mild urinary symptoms. Towards the end of treatment he did develop diarrhea.   He is due to have his PSA obtained next week, andplans to be seen in 2 weeks with Dr. Alinda Money for follow-up and his next Lupron injection.                        On review of systems, the patient states he has been doing pretty well. His fatigue continues but has gotten slightly better. He does notice on days where he is exerting himself or out about doing lots appearance, that he fatigues more easily. He states that he is experiencing some mental low back pain and also has had trouble with gout his primary care provider is aware of this and has recommended over-the-counter analgesics. He continues to have hot flashes from his Lupron. He describes imaging of his extremities where he has had an interruption for quite some time during the course of treatment. He is using hydrocortisone for imaging. He denies any shortness of breath or chest pain, fevers or chills. He does state that he has noticed S diarrhea since radiation finished that last week did have to use 1 tablet of Imodium for loose stools. He states that he does have a sense of fecal urgency 2 hours after eating most  days. He also reports that about a week ago he was seen in urology's office by Fritz Pickerel, one of the nurse practitioners due to a lesion on the glans of the penis. He has been prescribed clobetasol and is using this twice daily. He states that the area that he is applying this is about the same as it has been when he started the cream. He denies any itching but states the discomfort in the skin. He notices a liquid that drains from the site. He is planning to have this reevaluated when he is seen for his PSA screening. No other complaints are verbalized.  ALLERGIES:  has No Known Allergies.  Meds: Current Outpatient Prescriptions  Medication Sig Dispense Refill  . benazepril (LOTENSIN) 20 MG tablet   1  . colchicine 0.6 MG tablet Take 0.6 mg by mouth daily. Reported on 01/08/2016    . famotidine (PEPCID) 10 MG tablet Take 10 mg by mouth 2 (two) times daily. Reported on 09/18/2015    . calcium carbonate (TUMS - DOSED IN MG ELEMENTAL CALCIUM) 500 MG chewable tablet Chew 1 tablet by mouth daily.    . cholecalciferol (VITAMIN D) 1000 units tablet Take 1,000 Units by mouth daily.    . clotrimazole-betamethasone (LOTRISONE) cream APPLY A THIN LAYER TO THE AFFECTED AREA 2 TIMES DAILY FOR 2 WEEKS  0  .  Misc Natural Products (TART CHERRY ADVANCED PO) Take by mouth. Reported on 01/08/2016    . Omega-3 Fatty Acids (FISH OIL) 1000 MG CAPS Take by mouth. Reported on 01/08/2016    . predniSONE (STERAPRED UNI-PAK 21 TAB) 10 MG (21) TBPK tablet TAKE TABLETS BY MOUTH IN THESE TAPERED DOSES ONCE A DAY: 6,5,4,3,2,1  0  . vitamin B-12 (CYANOCOBALAMIN) 1000 MCG tablet Take 1,000 mcg by mouth daily. Reported on 09/18/2015     No current facility-administered medications for this encounter.     Physical Findings:  height is $RemoveB'5\' 9"'mgqCTQwt$  (1.753 m) and weight is 176 lb 4.8 oz (80 kg). His oral temperature is 98.2 F (36.8 C). His blood pressure is 132/68 and his pulse is 70. His respiration is 18.  In general this is a well appearing  Caucasian male in no acute distress. He's alert and oriented x4 and appropriate throughout the examination. Cardiopulmonary assessment is negative for acute distress and he exhibits normal effort. He continues to have or eruptions on the ventral surface of his forearms, and his lower extremities bilaterally. These eruptions are erythematous macules that have raised edges without evidence of any central punctate. No evidence of cellulitic changes identified. GU exam is deferred.   Lab Findings: No results found for: WBC, HGB, HCT, MCV, PLT   Radiographic Findings: No results found.  Impression/Plan: 1. 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 The patient will continue on androgen deprivation under the care of Dr. Alinda Money. He has scheduled follow-up for this. we reviewed the long-term risks associated with radiotherapy, and are pleased with the patient is doing better since completion of treatment. We will be happy to see him in the future if there is a need for additional radiotherapy or if he is having concerns regarding his previous treatment. 2. Survivorship. I discussed the options remaining with her survivorship nurse practitioner, the patient considers this and is interested in meeting Mike Craze, NP. A referral is placed. 3. Penile lesion. The patient will continue with urology for management of this. It sounds as though this could be dryness due to radiation exposure.  4. Gout. The patient will continue follow-up with his primary care provider for this, but we did discuss some dietary changes he could make to have less frequent episodes. Agreement and understanding. 5. Macular rash. Patient has not been seen by a dermatologist for this, and consult medicating hydrocortisone. We have recommended that he be evaluated and referrals placed to dermatology for this. He states agreement and understanding.    Carola Rhine, PAC

## 2016-03-29 NOTE — Progress Notes (Addendum)
[name] is here for a one month follow up visit for adenocarcinoma of the prostate. Weight and vitals stable.  Pain to back 3/10 tylenol. Reports appetite is normal. Report soreness  to left groin is less. Denies dysuria, hematuria, frequency, or urgency. Describes a strong steady urine stream without difficulty emptying. Denies leakage or incontinence. Reports nocturia x 1.  Had diarrhea this week. Reports fatigue is ongoing.  States he is itching a lot arms,abdomen and legs from a rash but is is getting better.  Will see Dr. Alinda Money  9-6, 2017 to take Lupon injection 9-6-, 2017 Wt Readings from Last 3 Encounters:  03/29/16 176 lb 4.8 oz (80 kg)  02/18/16 177 lb 9.6 oz (80.6 kg)  02/12/16 177 lb 1.6 oz (80.3 kg)  BP 132/68 (BP Location: Right Arm, Patient Position: Sitting, Cuff Size: Normal)   Pulse 70   Temp 98.2 F (36.8 C) (Oral)   Resp 18   Ht $R'5\' 9"'dM$  (1.753 m)   Wt 176 lb 4.8 oz (80 kg)   BMI 26.03 kg/m

## 2016-03-30 ENCOUNTER — Telehealth: Payer: Self-pay | Admitting: Radiation Oncology

## 2016-03-30 NOTE — Telephone Encounter (Signed)
Called and left Thomas Guerrero a voicemail letting him know that we've scheduled him with Marline Backbone, PA at [facility] on 9/11 @ 2:45pm. If he should need to reschedule the appt he can call # [phone]. This was first avail from three different dermatology offices.

## 2016-03-31 ENCOUNTER — Ambulatory Visit: Payer: Self-pay | Admitting: Radiation Oncology

## 2016-04-06 DIAGNOSIS — C61 Malignant neoplasm of prostate: Secondary | ICD-10-CM | POA: Diagnosis not present

## 2016-04-13 DIAGNOSIS — C61 Malignant neoplasm of prostate: Secondary | ICD-10-CM | POA: Diagnosis not present

## 2016-04-18 DIAGNOSIS — L309 Dermatitis, unspecified: Secondary | ICD-10-CM | POA: Diagnosis not present

## 2016-04-18 DIAGNOSIS — L570 Actinic keratosis: Secondary | ICD-10-CM | POA: Diagnosis not present

## 2016-04-18 DIAGNOSIS — L821 Other seborrheic keratosis: Secondary | ICD-10-CM | POA: Diagnosis not present

## 2016-04-19 ENCOUNTER — Other Ambulatory Visit (HOSPITAL_COMMUNITY): Payer: Self-pay | Admitting: Urology

## 2016-04-19 DIAGNOSIS — C61 Malignant neoplasm of prostate: Secondary | ICD-10-CM

## 2016-04-26 ENCOUNTER — Ambulatory Visit (HOSPITAL_COMMUNITY)
Admission: RE | Admit: 2016-04-26 | Discharge: 2016-04-26 | Disposition: A | Discharge status: Home / Self Care | Payer: Medicare Other | Source: Ambulatory Visit | Attending: Urology | Admitting: Urology

## 2016-04-26 DIAGNOSIS — M4806 Spinal stenosis, lumbar region: Secondary | ICD-10-CM | POA: Diagnosis not present

## 2016-04-26 DIAGNOSIS — M5030 Other cervical disc degeneration, unspecified cervical region: Secondary | ICD-10-CM | POA: Insufficient documentation

## 2016-04-26 DIAGNOSIS — M4802 Spinal stenosis, cervical region: Secondary | ICD-10-CM | POA: Insufficient documentation

## 2016-04-26 DIAGNOSIS — M4804 Spinal stenosis, thoracic region: Secondary | ICD-10-CM | POA: Diagnosis not present

## 2016-04-26 DIAGNOSIS — C61 Malignant neoplasm of prostate: Secondary | ICD-10-CM | POA: Insufficient documentation

## 2016-04-26 DIAGNOSIS — M5136 Other intervertebral disc degeneration, lumbar region: Secondary | ICD-10-CM | POA: Diagnosis not present

## 2016-05-02 ENCOUNTER — Encounter: Payer: Medicare Other | Admitting: Adult Health

## 2016-05-12 ENCOUNTER — Telehealth: Payer: Self-pay

## 2016-05-12 NOTE — Telephone Encounter (Signed)
Mr Dovber stated that he forgot about appointment on 05-02-16.He is fine with r/s appointment. Can leave appointment information in his home voicemail. Sent scheduling message for R?S.

## 2016-05-16 DIAGNOSIS — Z23 Encounter for immunization: Secondary | ICD-10-CM | POA: Diagnosis not present

## 2016-05-16 DIAGNOSIS — L570 Actinic keratosis: Secondary | ICD-10-CM | POA: Diagnosis not present

## 2016-05-17 ENCOUNTER — Telehealth: Payer: Self-pay | Admitting: Adult Health

## 2016-05-17 NOTE — Telephone Encounter (Signed)
lvm to inform pt of r/s survivorship appt 12/13 per LOS. Next available given

## 2016-05-24 DIAGNOSIS — M109 Gout, unspecified: Secondary | ICD-10-CM | POA: Diagnosis not present

## 2016-05-24 DIAGNOSIS — C61 Malignant neoplasm of prostate: Secondary | ICD-10-CM | POA: Diagnosis not present

## 2016-05-24 DIAGNOSIS — N183 Chronic kidney disease, stage 3 (moderate): Secondary | ICD-10-CM | POA: Diagnosis not present

## 2016-06-03 ENCOUNTER — Telehealth: Payer: Self-pay | Admitting: Medical Oncology

## 2016-06-03 NOTE — Progress Notes (Signed)
Called Thomas Guerrero as 3 month follow up post radiation. Spoke with his wife and she states he is very fatigued, seems down/depressed and having pain in his joints. He also has pain at the injection site of his last Lupron. I explained that these all are potential side effects of the Lupron.She and her daughter state he is just not himself.  I asked her to call Dr. Lynne Logan office and discuss with his nurse Nira Conn. She states she will because his next visit with Dr. Alinda Money is not until March.

## 2016-06-13 DIAGNOSIS — R197 Diarrhea, unspecified: Secondary | ICD-10-CM | POA: Diagnosis not present

## 2016-06-13 DIAGNOSIS — M109 Gout, unspecified: Secondary | ICD-10-CM | POA: Diagnosis not present

## 2016-07-04 ENCOUNTER — Other Ambulatory Visit: Payer: Self-pay | Admitting: Family Medicine

## 2016-07-04 ENCOUNTER — Ambulatory Visit
Admission: RE | Admit: 2016-07-04 | Discharge: 2016-07-04 | Disposition: A | Discharge status: Home / Self Care | Payer: Medicare Other | Source: Ambulatory Visit | Attending: Family Medicine | Admitting: Family Medicine

## 2016-07-04 DIAGNOSIS — M7732 Calcaneal spur, left foot: Secondary | ICD-10-CM | POA: Diagnosis not present

## 2016-07-04 DIAGNOSIS — R52 Pain, unspecified: Secondary | ICD-10-CM

## 2016-07-04 DIAGNOSIS — M7731 Calcaneal spur, right foot: Secondary | ICD-10-CM | POA: Diagnosis not present

## 2016-07-04 DIAGNOSIS — M79671 Pain in right foot: Secondary | ICD-10-CM | POA: Diagnosis not present

## 2016-07-20 ENCOUNTER — Encounter: Payer: Self-pay | Admitting: Adult Health

## 2016-07-20 ENCOUNTER — Ambulatory Visit (HOSPITAL_BASED_OUTPATIENT_CLINIC_OR_DEPARTMENT_OTHER): Payer: Medicare Other | Admitting: Adult Health

## 2016-07-20 VITALS — BP 166/61 | HR 65 | Temp 98.0°F | Resp 18 | Ht 69.0 in | Wt 177.1 lb

## 2016-07-20 DIAGNOSIS — C61 Malignant neoplasm of prostate: Secondary | ICD-10-CM

## 2016-07-20 DIAGNOSIS — E291 Testicular hypofunction: Secondary | ICD-10-CM

## 2016-07-20 NOTE — Progress Notes (Signed)
CLINIC:  Survivorship  REASON FOR VISIT:  Survivorship Care Plan visit & to address acute survivorship needs for recent history of prostate cancer.   BRIEF ONCOLOGY HISTORY:    Prostate cancer ([place])   08/31/2015 Tumor Marker    PSA 33.83 and right prostate nodule; discovered by PCP.       09/03/2015 Initial Biopsy    Prostate biopsy. 11/12 cores (+) for prostate adenoca. Gleason 7 (3+4) in 5 cores, 5-95%, WHO Grade 2, PNI present.  Gleason 6 (3+3) in 6 cores, 5-95%, PNI present.       09/03/2015 Initial Diagnosis    Prostate cancer ([facility])      09/22/2015 Imaging    Bone scan:  No evidence of osseous metastatic disease.        Miscellaneous    ADT with Lupron       12/29/2015 - 02/24/2016 Radiation Therapy    [facility] Thomas Guerrero). The prostate, seminal vesicles, and pelvic lymph nodes were initially treated to 45 Gy in 25 fractions of 1.8 Gy.  The prostate only was boosted to 75 Gy with 15 additional fractions of 2.0 Gy       04/06/2016 Tumor Marker    PSA 0.03      04/26/2016 Imaging    MRI spine: No osseous metastatic disease in lumbar & thoracic spine.        INTERVAL HISTORY:  Thomas Guerrero presents to the survivorship clinic for our initial meeting to address any acute side effects of treatment and review his survivorship care plan document.    Overall, he tells me he has been feeling okay from a prostate cancer standpoint.  He is struggling quite a bit from several orthopedic issues including pain with peripheral neuropathy to his feet, ankles, knees, and back.  He tells me he is seeing 3 different doctors to help him with these concerns.    He has mild nocturia, about 1-2x/night.  Denies dysuria, hematuria, urinary incontinence, hematochezia, or pain with defecation.    He has fatigue, which he attributes to the Lupron injections.  He tells me "I'll be on those shots until the end of next year."  He has occasional hot flashes, but they are manageable.  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  His appetite is okay. He has lost some weight, which he attributes to being busy working and not eating consistent meals during the day.  He tells me his weight has stabilized since he completed radiation therapy though.    ADDITIONAL REVIEW OF SYSTEMS:  Review of Systems  Constitutional: Positive for fatigue.  HENT:  Negative.   Eyes: Negative.   Respiratory: Negative.   Cardiovascular: Negative.   Gastrointestinal: Negative.   Endocrine: Positive for hot flashes.  Genitourinary: Positive for nocturia. Negative for bladder incontinence, dysuria, frequency and hematuria.   Musculoskeletal: Positive for arthralgias, back pain and myalgias.  Skin: Negative.   Neurological: Positive for numbness.  Hematological: Negative.   Psychiatric/Behavioral: Negative.       PAST MEDICAL & SURGICAL HISTORY:  Past Medical History:  Diagnosis Date  . Chronic kidney disease   . GERD (gastroesophageal reflux disease)   . Hx of gout   . Hypertension   . Prostate cancer [facility])     Past Surgical History:  Procedure Laterality Date  . Biopsy of the Prostate       CURRENT MEDICATIONS:  Current Outpatient Prescriptions on File  Prior to Visit  Medication Sig Dispense Refill  . benazepril (LOTENSIN) 20 MG tablet   1  . calcium carbonate (TUMS - DOSED IN MG ELEMENTAL CALCIUM) 500 MG chewable tablet Chew 1 tablet by mouth daily.    . cholecalciferol (VITAMIN D) 1000 units tablet Take 1,000 Units by mouth daily.    . clotrimazole-betamethasone (LOTRISONE) cream APPLY A THIN LAYER TO THE AFFECTED AREA 2 TIMES DAILY FOR 2 WEEKS  0  . famotidine (PEPCID) 10 MG tablet Take 10 mg by mouth 2 (two) times daily. Reported on 09/18/2015    . Misc Natural Products (TART CHERRY ADVANCED PO) Take by mouth. Reported on 01/08/2016     No current facility-administered medications on file prior to visit.      ALLERGIES:  No Known Allergies   SOCIAL HISTORY:   Thomas Guerrero is married and lives with his wife in [place], [place].  In June, they will celebrate 24 years of marriage.  They had 3 children; 2 children are alive and well; 1 child is deceased.  He has 5 grandchildren and 2 great-grandchildren ranging in ages from 4-28.  He works in his own business Energy manager.  He is proud of his grandson who has started his own business doing the same work as the patient.  Thomas Guerrero denies any current tobacco, alcohol, or illicit drug use.     PHYSICAL EXAM:  Vitals:  Vitals:   07/20/16 1055  BP: (!) 166/61  Pulse: 65  Resp: 18  Temp: 98 F (36.7 C)   Filed Weights   07/20/16 1055  Weight: 177 lb 1.6 oz (80.3 kg)    General: Well-nourished, well-appearing male in no acute distress.  Unaccompanied today.  HEENT: Head is normocephalic.  Pupils equal and reactive to light. Conjunctivae clear without exudate.  Sclerae anicteric. Oral mucosa pink and moist.  Lymph: No cervical, supraclavicular, or infraclavicular lymphadenopathy noted on palpation.   Respiratory: Clear to auscultation bilaterally. Chest expansion symmetric; breathing non-labored.   GU: Deferred. Upcoming exam with urologist.  Neuro: No focal deficits. Steady gait.   Psych: Normal mood and affect for situation. Extremities: No edema. Skin: Warm and dry.   LABORATORY DATA:  None for this visit.   DIAGNOSTIC IMAGING:  None for this visit.    ASSESSMENT & PLAN:  Thomas Guerrero is a pleasant 70 y.o. male with history of Stage III (T3aN0M0) Gleason 7 (3+4) adenocarcinoma of the prostate; initial PSA 33.83 in 08/2015; treated with radiation therapy/ADT with Lupron; completed radiation therapy on 02/24/16.  He will continue with ADT for a total of 2 years.  Patient presents to survivorship clinic today for survivorship care plan visit and to address any acute survivorship concerns since completing  treatment.   1. Stage III prostate cancer:  Thomas Guerrero is continuing to recover from radiation therapy.  His most recent PSA was 0.03 on 04/06/16, which shows a very encouraging response to therapy.  He will continue androgen-deprivation therapy for a total of 2 years.  He will see his urologist, Dr. Alinda Money in 10/2016 for subsequent follow-up and PSA check.   Today, he received a copy of his [facility]) document, which was reviewed with him in detail.  The SCP details his cancer treatment history and potential late/long-term side effects of those treatments.  We discussed the follow-up schedule he can anticipate with interval imaging for surveillance of his cancer.  I have also shared a copy of his treatment summary/SCP with  his PCP.   2. Multiple orthopedic concerns: Thomas Guerrero has several orthopedic issues including pain and neuropathy, and he is seeking care with several specialists.  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  His bone scan at initial diagnosis of prostate cancer was also negative for malignancy, which is reassuring.  I encouraged him to maintain follow-up with his specialists who are working to optimize his mobility and symptoms.  I have low suspicion that his orthopedic concerns are related to his prostate cancer; I did encourage him to let us or Dr. Alinda Money know if he has new bone pain.   3. Hot flashes secondary to ADT:  We reviewed the role that testosterone plays in males, as well as in prostate carcinogenesis; he understands the purpose of the Lupron injections as it relates to his cancer treatment.  He will likely need ADT for a total of 2 years given his intermediate to high-risk disease.  Right now, his hot flashes are manageable without intervention.   4. At risk of bone demineralization: Given his androgen-deprivation therapy, he is at  risk for bone thinning.  We discussed the purpose of DEXA imaging in order to monitor for osteopenia or osteoporosis.  I will defer any bone mineral density testing to his urologist or PCP, as clinically indicated.  In the meantime, I encouraged him to continue to engage in weight-bearing exercises and increase his consumption of calcium with vitamin D (either in his diet or as a supplement).    5. Physical activity/Healthy eating: Getting adequate physical activity and maintaining a healthy diet as a cancer survivor is important for overall wellness and reduces the risk of cancer recurrence. Exercise will also help with his complaints of fatigue.  We discussed the [facility] which is a fitness program that is offered to cancer survivors free of charge.  We also reviewed "The Nutrition Rainbow" handout, as well as the American Cancer Society's booklet with recommendations for nutrition and physical activity. He was given a Engineer, civil (consulting) with instructions on how to get enrolled in this program if he chooses to do so.   6. Health promotion/Cancer screening:  Thomas Guerrero was given the national recommendations for colonoscopy, skin screenings, and vaccinations.  I encouraged him to talk with his PCP about arranging appropriate cancer screening tests, as clinically indicated.  7. Support services/Counseling: It is not uncommon for this period of the patient's cancer care trajectory to be one of many emotions and stressors.  Thomas Guerrero was encouraged to take advantage of our many support services programs, support groups, and/or counseling in coping with her new life as a cancer survivor after completing anti-cancer treatment. I specifically highlighted for him when the Prostate Cancer Support Group is held here at the cancer center.  The fellowship of other survivors/caregivers in group therapy facilitated by our prostate cancer support group leader could be helpful for both the patient and his  caregiver.   He was given a calendar of the cancer center's support services events, as well as brochures for our spiritual care and free counseling resources.     Dispo:  -Follow-up with Dr. Alinda Money at [facility] as directed.  -Return to survivorship clinic as needed; no additional follow-up needed at this time.   -Consider transitioning the patient to long-term survivorship, when clinically appropriate.    A total of 30 minutes  was spent in face-to-face care of this patient, with greater than 50% of that time spent in counseling and care coordination.   Mike Craze, NP [place] [phone]

## 2016-07-22 DIAGNOSIS — G5793 Unspecified mononeuropathy of bilateral lower limbs: Secondary | ICD-10-CM | POA: Diagnosis not present

## 2016-07-22 DIAGNOSIS — Z6825 Body mass index (BMI) 25.0-25.9, adult: Secondary | ICD-10-CM | POA: Diagnosis not present

## 2016-07-22 DIAGNOSIS — M1009 Idiopathic gout, multiple sites: Secondary | ICD-10-CM | POA: Diagnosis not present

## 2016-07-22 DIAGNOSIS — M150 Primary generalized (osteo)arthritis: Secondary | ICD-10-CM | POA: Diagnosis not present

## 2016-07-22 DIAGNOSIS — M2550 Pain in unspecified joint: Secondary | ICD-10-CM | POA: Diagnosis not present

## 2016-07-22 DIAGNOSIS — E538 Deficiency of other specified B group vitamins: Secondary | ICD-10-CM | POA: Diagnosis not present

## 2016-07-22 DIAGNOSIS — E663 Overweight: Secondary | ICD-10-CM | POA: Diagnosis not present

## 2016-07-22 DIAGNOSIS — R5383 Other fatigue: Secondary | ICD-10-CM | POA: Diagnosis not present

## 2016-08-18 DIAGNOSIS — D225 Melanocytic nevi of trunk: Secondary | ICD-10-CM | POA: Diagnosis not present

## 2016-08-18 DIAGNOSIS — D1801 Hemangioma of skin and subcutaneous tissue: Secondary | ICD-10-CM | POA: Diagnosis not present

## 2016-08-18 DIAGNOSIS — Z23 Encounter for immunization: Secondary | ICD-10-CM | POA: Diagnosis not present

## 2016-08-18 DIAGNOSIS — L821 Other seborrheic keratosis: Secondary | ICD-10-CM | POA: Diagnosis not present

## 2016-08-18 DIAGNOSIS — L570 Actinic keratosis: Secondary | ICD-10-CM | POA: Diagnosis not present

## 2016-08-18 DIAGNOSIS — L814 Other melanin hyperpigmentation: Secondary | ICD-10-CM | POA: Diagnosis not present

## 2016-08-20 DIAGNOSIS — M5136 Other intervertebral disc degeneration, lumbar region: Secondary | ICD-10-CM | POA: Diagnosis not present

## 2016-08-26 DIAGNOSIS — M150 Primary generalized (osteo)arthritis: Secondary | ICD-10-CM | POA: Diagnosis not present

## 2016-08-26 DIAGNOSIS — M1009 Idiopathic gout, multiple sites: Secondary | ICD-10-CM | POA: Diagnosis not present

## 2016-08-26 DIAGNOSIS — Z6825 Body mass index (BMI) 25.0-25.9, adult: Secondary | ICD-10-CM | POA: Diagnosis not present

## 2016-08-26 DIAGNOSIS — E663 Overweight: Secondary | ICD-10-CM | POA: Diagnosis not present

## 2016-08-26 DIAGNOSIS — G5793 Unspecified mononeuropathy of bilateral lower limbs: Secondary | ICD-10-CM | POA: Diagnosis not present

## 2016-08-26 DIAGNOSIS — M2550 Pain in unspecified joint: Secondary | ICD-10-CM | POA: Diagnosis not present

## 2016-08-31 DIAGNOSIS — I129 Hypertensive chronic kidney disease with stage 1 through stage 4 chronic kidney disease, or unspecified chronic kidney disease: Secondary | ICD-10-CM | POA: Diagnosis not present

## 2016-08-31 DIAGNOSIS — M109 Gout, unspecified: Secondary | ICD-10-CM | POA: Diagnosis not present

## 2016-08-31 DIAGNOSIS — M4800 Spinal stenosis, site unspecified: Secondary | ICD-10-CM | POA: Diagnosis not present

## 2016-08-31 DIAGNOSIS — Z23 Encounter for immunization: Secondary | ICD-10-CM | POA: Diagnosis not present

## 2016-08-31 DIAGNOSIS — E7800 Pure hypercholesterolemia, unspecified: Secondary | ICD-10-CM | POA: Diagnosis not present

## 2016-08-31 DIAGNOSIS — C61 Malignant neoplasm of prostate: Secondary | ICD-10-CM | POA: Diagnosis not present

## 2016-08-31 DIAGNOSIS — N183 Chronic kidney disease, stage 3 (moderate): Secondary | ICD-10-CM | POA: Diagnosis not present

## 2016-08-31 DIAGNOSIS — Z0000 Encounter for general adult medical examination without abnormal findings: Secondary | ICD-10-CM | POA: Diagnosis not present

## 2016-09-02 DIAGNOSIS — M5136 Other intervertebral disc degeneration, lumbar region: Secondary | ICD-10-CM | POA: Diagnosis not present

## 2016-09-02 DIAGNOSIS — M5137 Other intervertebral disc degeneration, lumbosacral region: Secondary | ICD-10-CM | POA: Diagnosis not present

## 2016-09-02 DIAGNOSIS — M545 Low back pain: Secondary | ICD-10-CM | POA: Diagnosis not present

## 2016-09-16 ENCOUNTER — Ambulatory Visit: Payer: PRIVATE HEALTH INSURANCE | Admitting: Neurology

## 2016-09-28 DIAGNOSIS — Z1211 Encounter for screening for malignant neoplasm of colon: Secondary | ICD-10-CM | POA: Diagnosis not present

## 2016-10-05 ENCOUNTER — Ambulatory Visit (INDEPENDENT_AMBULATORY_CARE_PROVIDER_SITE_OTHER): Payer: Medicare Other | Admitting: Neurology

## 2016-10-05 ENCOUNTER — Encounter: Payer: Self-pay | Admitting: Neurology

## 2016-10-05 VITALS — BP 145/85 | HR 91 | Ht 69.0 in | Wt 180.0 lb

## 2016-10-05 DIAGNOSIS — E531 Pyridoxine deficiency: Secondary | ICD-10-CM

## 2016-10-05 DIAGNOSIS — G629 Polyneuropathy, unspecified: Secondary | ICD-10-CM

## 2016-10-05 DIAGNOSIS — G63 Polyneuropathy in diseases classified elsewhere: Secondary | ICD-10-CM

## 2016-10-05 DIAGNOSIS — E538 Deficiency of other specified B group vitamins: Secondary | ICD-10-CM

## 2016-10-05 DIAGNOSIS — E5111 Dry beriberi: Secondary | ICD-10-CM

## 2016-10-05 MED ORDER — GABAPENTIN 300 MG PO CAPS: 300.0000 mg | ORAL_CAPSULE | Freq: Three times a day (TID) | ORAL | 11 refills | Status: DC

## 2016-10-05 NOTE — Patient Instructions (Signed)
Remember to drink plenty of fluid, eat healthy meals and do not skip any meals. Try to eat protein with a every meal and eat a healthy snack such as fruit or nuts in between meals. Try to keep a regular sleep-wake schedule and try to exercise daily, particularly in the form of walking, 20-30 minutes a day, if you can.   As far as your medications are concerned, I would like to suggest: Can try neurontin(Gabapentin) for pain  As far as diagnostic testing: emg/ncs  I would like to see you back for emg/ncs, sooner if we need to. Please call us with any interim questions, concerns, problems, updates or refill requests.   Our phone number is [phone]. We also have an after hours call service for urgent matters and there is a physician on-call for urgent questions. For any emergencies you know to call 911 or go to the nearest emergency room  Gabapentin capsules or tablets What is this medicine? GABAPENTIN (GA ba pen tin) is used to control partial seizures in adults with epilepsy. It is also used to treat certain types of nerve pain. This medicine may be used for other purposes; ask your health care provider or pharmacist if you have questions. COMMON BRAND NAME(S): Active-PAC with Gabapentin, Gabarone, Neurontin What should I tell my health care provider before I take this medicine? They need to know if you have any of these conditions: -kidney disease -suicidal thoughts, plans, or attempt; a previous suicide attempt by you or a family member -an unusual or allergic reaction to gabapentin, other medicines, foods, dyes, or preservatives -pregnant or trying to get pregnant -breast-feeding How should I use this medicine? Take this medicine by mouth with a glass of water. Follow the directions on the prescription label. You can take it with or without food. If it upsets your stomach, take it with food.Take your medicine at regular intervals. Do not take it more often than directed. Do not stop  taking except on your doctor's advice. If you are directed to break the 600 or 800 mg tablets in half as part of your dose, the extra half tablet should be used for the next dose. If you have not used the extra half tablet within 28 days, it should be thrown away. A special MedGuide will be given to you by the pharmacist with each prescription and refill. Be sure to read this information carefully each time. Talk to your pediatrician regarding the use of this medicine in children. Special care may be needed. Overdosage: If you think you have taken too much of this medicine contact a poison control center or emergency room at once. NOTE: This medicine is only for you. Do not share this medicine with others. What if I miss a dose? If you miss a dose, take it as soon as you can. If it is almost time for your next dose, take only that dose. Do not take double or extra doses. What may interact with this medicine? Do not take this medicine with any of the following medications: -other gabapentin products This medicine may also interact with the following medications: -alcohol -antacids -antihistamines for allergy, cough and cold -certain medicines for anxiety or sleep -certain medicines for depression or psychotic disturbances -homatropine; hydrocodone -naproxen -narcotic medicines (opiates) for pain -phenothiazines like chlorpromazine, mesoridazine, prochlorperazine, thioridazine This list may not describe all possible interactions. Give your health care provider a list of all the medicines, herbs, non-prescription drugs, or dietary supplements you use. Also tell them  if you smoke, drink alcohol, or use illegal drugs. Some items may interact with your medicine. What should I watch for while using this medicine? Visit your doctor or health care professional for regular checks on your progress. You may want to keep a record at home of how you feel your condition is responding to treatment. You may  want to share this information with your doctor or health care professional at each visit. You should contact your doctor or health care professional if your seizures get worse or if you have any new types of seizures. Do not stop taking this medicine or any of your seizure medicines unless instructed by your doctor or health care professional. Stopping your medicine suddenly can increase your seizures or their severity. Wear a medical identification bracelet or chain if you are taking this medicine for seizures, and carry a card that lists all your medications. You may get drowsy, dizzy, or have blurred vision. Do not drive, use machinery, or do anything that needs mental alertness until you know how this medicine affects you. To reduce dizzy or fainting spells, do not sit or stand up quickly, especially if you are an older patient. Alcohol can increase drowsiness and dizziness. Avoid alcoholic drinks. Your mouth may get dry. Chewing sugarless gum or sucking hard candy, and drinking plenty of water will help. The use of this medicine may increase the chance of suicidal thoughts or actions. Pay special attention to how you are responding while on this medicine. Any worsening of mood, or thoughts of suicide or dying should be reported to your health care professional right away. Women who become pregnant while using this medicine may enroll in the [place] Pregnancy Registry by calling [phone]. This registry collects information about the safety of antiepileptic drug use during pregnancy. What side effects may I notice from receiving this medicine? Side effects that you should report to your doctor or health care professional as soon as possible: -allergic reactions like skin rash, itching or hives, swelling of the face, lips, or tongue -worsening of mood, thoughts or actions of suicide or dying Side effects that usually do not require medical attention (report to your doctor  or health care professional if they continue or are bothersome): -constipation -difficulty walking or controlling muscle movements -dizziness -nausea -slurred speech -tiredness -tremors -weight gain This list may not describe all possible side effects. Call your doctor for medical advice about side effects. You may report side effects to FDA at 1-800-FDA-1088. Where should I keep my medicine? Keep out of reach of children. This medicine may cause accidental overdose and death if it taken by other adults, children, or pets. Mix any unused medicine with a substance like cat litter or coffee grounds. Then throw the medicine away in a sealed container like a sealed bag or a coffee can with a lid. Do not use the medicine after the expiration date. Store at room temperature between 15 and 30 degrees C (59 and 86 degrees F). NOTE: This sheet is a summary. It may not cover all possible information. If you have questions about this medicine, talk to your doctor, pharmacist, or health care provider.  2018 Elsevier/Gold Standard (2013-09-20 15:26:50)

## 2016-10-05 NOTE — Progress Notes (Addendum)
[facility]    Provider:  Dr Jaynee Eagles Referring Provider: [name], MD Primary Care Physician:  Osborne Casco, MD  CC: foot pain  HPI:  Thomas Guerrero is a 71 y.o. male here as a referral from Dr. Laurann Montana for foot pain. PMHx gout, radiation for prostate cancer, CKD.  He had radiation for his prostate cancer. That's when he started with problems in the feet. He was being treated for gout. Gout helped the joint but not the feet. He saw Dr. Trudie Reed in rheumatology. He was having pain in the back. She sent him to dr. Nelva Bush for his back pain. He was losing his sense of balance. He fell one time but the shots in the back have significantly helped. The inside of his lower left leg feels wet. The pain in the feet are burning. Especially across the toes on the left foot and in the arch. It hurts on the dorsum of the left foot.The whole foot feels bad in the toes and up to the arch. He can;t wear tight shoes because it hurts. Pain in the feet ois worse at night. His feet get very cold. Hands get cold and fingers cramp. He puts a neuropathy cream on at night. Worse with standing. Better with walking. The cream on the feet help a lot.  The whole foot just started hurting, but when he thinks about it he has had pain on the top of his feet for even longer maybe years. He wore boots for a long time. He worse his shoes tight.   Reviewed notes, labs and imaging from outside physicians, which showed: personally reviewed images and agree with the following:  MRI THORACIC SPINE FINDINGS  Limited sagittal imaging the cervical spine remarkable for a fairly advanced and widespread disc and endplate degeneration. Degenerative spinal stenosis evident at C3-C4.  Alignment: Mild dextro convex thoracic scoliosis, mildly exaggerated upper thoracic kyphosis.  Thoracic segmentation appears normal.  Vertebrae: Benign vertebral body hemangioma in T11. Elsewhere thoracic spine bone  marrow signal is within normal limits. No marrow edema or evidence of acute osseous abnormality.  Cord: Spinal cord signal is within normal limits at all visualized levels. Conus medullaris is normal at T12-L1.  Paraspinal and other soft tissues: Negative visualized thoracic and upper abdominal viscera. Negative visualized posterior paraspinal soft tissues.  Disc levels:  Thoracic spine degeneration with intermittent small thoracic disc herniations. The most pronounced disc disease at T7-T8 where there is a right paracentral disc extrusion as seen on series 15, image 24. This results in spinal stenosis with mild spinal cord mass effect. No cord signal abnormality. No significant thoracic spinal stenosis elsewhere.  MRI LUMBAR SPINE FINDINGS  Segmentation: Normal lumbar segmentation, concordant with the thoracic spine numbering.  Alignment:  Normal.  Vertebrae: Mildly heterogeneous bone marrow signal in the upper lumbar spine. Increased T1 bone marrow signal in the lower lumbar spine and visible pelvis suggesting previous radiation therapy. No suspicious marrow lesion. No marrow edema or evidence of acute osseous abnormality.  Conus medullaris: Extends to the T12-L1 level and appears normal. Cauda equina nerve roots are within normal limits.  Paraspinal and other soft tissues: Negative visualized abdominal viscera. Negative visualized posterior paraspinal soft tissues.  Disc levels:  Lumbar spine degeneration with significant stenosis at:  L2-L3: Right eccentric circumferential disc bulge with endplate spurring and facet hypertrophy. Mild to moderate right lateral recess and right L2 foraminal stenosis.  L4-L5: Circumferential disc bulge with broad-based posterior component. Moderate facet and ligament flavum hypertrophy.  Mild spinal and lateral recess stenosis. Mild bilateral L4 foraminal stenosis.  IMPRESSION: MR THORACIC SPINE IMPRESSION  1. No  osseous metastatic disease in the thoracic spine. 2. Thoracic spine degeneration with degenerative spinal stenosis at T7-T8 resulting in spinal cord mass effect but no thoracic spinal cord signal abnormality. 3. Partially visible advanced cervical spine degeneration with degenerative cervical spinal stenosis.  MR LUMBAR SPINE IMPRESSION  1. No osseous metastatic disease in the lumbar spine. Evidence of previous lower lumbar and pelvic radiation. 2. Degenerative mild lumbar spinal stenosis and other neural impingement at L4-L5, and L2-L3.   Electronically Signed   By: Genevie Ann M.D.   On: 04/26/2016 18:44  Review of Systems: Patient complains of symptoms per HPI as well as the following symptoms: No CP, no SOB. Pertinent negatives per HPI. All others negative.   Social History   Social History  . Marital status: Married    Spouse name: N/A  . Number of children: 2  . Years of education: Some college   Occupational History  . Retired    Social History Main Topics  . Smoking status: Former Smoker    Years: 25.00    Types: Cigarettes    Start date: 08/08/1996  . Smokeless tobacco: Never Used  . Alcohol use 0.0 oz/week  . Drug use: No  . Sexual activity: Yes   Other Topics Concern  . Not on file   Social History Narrative   Lives at home w/ his wife and daughter   Right-handed   Caffeine: 1 soda per day    Family History  Problem Relation Age of Onset  . Cancer Son     leukemia  . Neuropathy Neg Hx     Past Medical History:  Diagnosis Date  . Chronic kidney disease   . GERD (gastroesophageal reflux disease)   . High cholesterol   . Hx of gout   . Hypertension   . Prostate cancer [facility]     Past Surgical History:  Procedure Laterality Date  . Biopsy of the Prostate      Current Outpatient Prescriptions  Medication Sig Dispense Refill  . allopurinol (ZYLOPRIM) 300 MG tablet Take 1 tablet by mouth daily.    Marland Kitchen atorvastatin (LIPITOR) 10 MG tablet  Take 10 mg by mouth daily.  11  . benazepril (LOTENSIN) 20 MG tablet   1  . cetirizine (ZYRTEC) 10 MG tablet Take 10 mg by mouth daily.    . cholecalciferol (VITAMIN D) 1000 units tablet Take 1,000 Units by mouth daily.    . clotrimazole-betamethasone (LOTRISONE) cream APPLY A THIN LAYER TO THE AFFECTED AREA 2 TIMES DAILY FOR 2 WEEKS  0  . colchicine 0.6 MG tablet TAKE 1 TABLET BY MOUTH EVERY 6 HOURS AS NEEDED FOR PAIN UNTIL GI SIDE EFFECTS.  1  . famotidine (PEPCID) 10 MG tablet Take 10 mg by mouth 2 (two) times daily. Reported on 09/18/2015    . Misc Natural Products (TART CHERRY ADVANCED PO) Take by mouth. Reported on 01/08/2016    . triamcinolone cream (KENALOG) 0.1 % Apply 1 application topically 2 (two) times daily as needed.    . gabapentin (NEURONTIN) 100 MG capsule Take 1 capsule (100 mg total) by mouth 3 (three) times daily. 90 capsule 11  . HYDROcodone-acetaminophen (NORCO/VICODIN) 5-325 MG tablet Take 1 tablet by mouth 2 (two) times daily.    . traMADol (ULTRAM) 50 MG tablet TAKE 1 TABLET BY MOUTH AT BEDTIME AS NEEDED FOR PAIN IN FEET  0   No current facility-administered medications for this visit.     Allergies as of 10/05/2016  . (No Known Allergies)    Vitals: BP (!) 145/85   Pulse 91   Ht _0  (1.753 m)   Wt 180 lb (81.6 kg)   BMI 26.58 kg/m  Last Weight:  Wt Readings from Last 1 Encounters:  10/05/16 180 lb (81.6 kg)   Last Height:   Ht Readings from Last 1 Encounters:  10/05/16 _1  (1.753 m)   Physical exam: Exam: Gen: NAD, conversant, well nourised, obese, well groomed                     CV: RRR, no MRG. No Carotid Bruits. No peripheral edema, warm, nontender Eyes: Conjunctivae clear without exudates or hemorrhage  Neuro: Detailed Neurologic Exam  Speech:    Speech is normal; fluent and spontaneous with normal comprehension.  Cognition:    The patient is oriented to person, place, and time;     recent and remote memory intact;     language fluent;      normal attention, concentration,     fund of knowledge Cranial Nerves:    The pupils are equal, round, and reactive to light. The fundi are normal and spontaneous venous pulsations are present. Visual fields are full to finger confrontation. Extraocular movements are intact. Trigeminal sensation is intact and the muscles of mastication are normal. The face is symmetric. The palate elevates in the midline. Hearing intact. Voice is normal. Shoulder shrug is normal. The tongue has normal motion without fasciculations.   Coordination:    Normal finger to nose and heel to shin. Normal rapid alternating movements.   Gait:    Heel-toe and tandem gait are normal.   Motor Observation:    No asymmetry, no atrophy, and no involuntary movements noted. Tone:    Normal muscle tone.    Posture:    Posture is normal. normal erect    Strength: Mild right prox weakness of LE otherwise strength is V/V in the upper and lower limbs.      Sensation: Dec pp and temp up to the knees and elbows Absent vibration great toe, a few secs mcp, 8 seconds ankle Absent proprioception in toes, intact in fingers Neg Romberg     Reflex Exam:  DTR's:    Absent AJs otherwise deep tendon reflexes in the upper and lower extremities are normal bilaterally.   Toes:    The toes are downgoing bilaterally.   Clonus:    Clonus is absent.    Assessment/Plan:   71 y.o. male here as a referral from Dr. Laurann Montana for foot pain.Exam shows significant decrease in all sensory modalities to above the knees and elbows. Started with radiation and gout treatment (colchicine related neuropathy?)  As far as your medications are concerned, I would like to suggest: Can try neurontin(Gabapentin) for pain  As far as diagnostic testing: emg/ncs  Have requested Labs from pcp and rheumatology so we don't repeat any after review can order any needed to complete workup for neuropathy  I would like to see you back for emg/ncs. Please  call us with any interim questions, concerns, problems, updates or refill requests.   Reviewed labs from Dr. Lenna Gilford addendum 11/24/2016: Uric acid normal, CK 62, CBC showed anemia hemoglobin 9.7 and hematocrit 99.5 with MCV of 99, low platelets 147, ESR 33, rheumatoid factor and CCP antibodies negative, CRP normal, CMP elevated calcium 10.4 otherwise EGFR  reduced due to creatinine 1.78, B12 435, TSH normal.  Sarina Ill, MD  [facility] [address] [place], [place] [address]  Phone [phone] Fax [phone]

## 2016-10-07 DIAGNOSIS — C61 Malignant neoplasm of prostate: Secondary | ICD-10-CM | POA: Diagnosis not present

## 2016-10-09 ENCOUNTER — Encounter: Payer: Self-pay | Admitting: Neurology

## 2016-10-12 DIAGNOSIS — Z5111 Encounter for antineoplastic chemotherapy: Secondary | ICD-10-CM | POA: Diagnosis not present

## 2016-10-12 DIAGNOSIS — C61 Malignant neoplasm of prostate: Secondary | ICD-10-CM | POA: Diagnosis not present

## 2016-10-17 ENCOUNTER — Telehealth: Payer: Self-pay | Admitting: Neurology

## 2016-10-17 DIAGNOSIS — Z1211 Encounter for screening for malignant neoplasm of colon: Secondary | ICD-10-CM | POA: Diagnosis not present

## 2016-10-17 HISTORY — PX: COLONOSCOPY: SHX174

## 2016-10-17 NOTE — Telephone Encounter (Signed)
Returned call to pt and let him know that it was fine to start new med. Recommended that he start by taking it once a day at bedtime, then increase to 3x/day as tolerated. Verbalized understanding and appreciation for call.

## 2016-10-17 NOTE — Telephone Encounter (Signed)
Patient called office in reference to gabapentin (NEURONTIN) 300 MG capsule.  Patient states he has picked the medication up at the pharmacy, but not sure if he is able to take the medication yet.  Please call

## 2016-11-08 MED ORDER — GABAPENTIN 100 MG PO CAPS: 100.0000 mg | ORAL_CAPSULE | Freq: Three times a day (TID) | ORAL | 11 refills | Status: DC

## 2016-11-08 NOTE — Addendum Note (Signed)
Addended by: Marcial Pacas on: 11/08/2016 09:30 AM   Modules accepted: Orders

## 2016-11-08 NOTE — Telephone Encounter (Signed)
Patient called office in reference to gabapentin (NEURONTIN) 300 MG capsule.  Patient states he is taking at night time and feeling drunk the next morning, patient states he is unable to get dressed and can not function.  Patient is wanting to see if the medication can be decreased.  Please call

## 2016-11-08 NOTE — Telephone Encounter (Signed)
Chart reviewed, patient was seen by Dr. Jaynee Eagles on October 05 2016 for lower extremity neuropathic pain,  I have called in gabapentin 100 mg capsule, starting from one tablet every night, if it is helping his symptoms, may titrating up to 1 tablet 3 times a day.

## 2016-11-08 NOTE — Addendum Note (Signed)
Addended by: Monte Fantasia on: 11/08/2016 12:32 PM   Modules accepted: Orders

## 2016-11-08 NOTE — Telephone Encounter (Signed)
Called pt who said that he felt drunk the morning's after taking gabapentin 300 mg at bedtime. Has been taking every other day but still can hardly walk/get dressed the next morning. Reports that medication is helping a whole lt w/ his feet. Advised that work-in MD sent in new rx for 100 mg caps and he may start by taking 1 at bedtime and increase to 3 per day as tolerated. Verbalized understanding and appreciation for call.

## 2016-11-24 ENCOUNTER — Ambulatory Visit (INDEPENDENT_AMBULATORY_CARE_PROVIDER_SITE_OTHER): Payer: Self-pay | Admitting: Neurology

## 2016-11-24 ENCOUNTER — Ambulatory Visit (INDEPENDENT_AMBULATORY_CARE_PROVIDER_SITE_OTHER): Payer: Medicare Other | Admitting: Neurology

## 2016-11-24 ENCOUNTER — Telehealth: Payer: Self-pay | Admitting: Neurology

## 2016-11-24 ENCOUNTER — Encounter (INDEPENDENT_AMBULATORY_CARE_PROVIDER_SITE_OTHER): Payer: Self-pay

## 2016-11-24 DIAGNOSIS — E538 Deficiency of other specified B group vitamins: Secondary | ICD-10-CM

## 2016-11-24 DIAGNOSIS — E531 Pyridoxine deficiency: Secondary | ICD-10-CM

## 2016-11-24 DIAGNOSIS — G63 Polyneuropathy in diseases classified elsewhere: Secondary | ICD-10-CM

## 2016-11-24 DIAGNOSIS — E5111 Dry beriberi: Secondary | ICD-10-CM

## 2016-11-24 DIAGNOSIS — G629 Polyneuropathy, unspecified: Secondary | ICD-10-CM

## 2016-11-24 DIAGNOSIS — Z0289 Encounter for other administrative examinations: Secondary | ICD-10-CM

## 2016-11-24 NOTE — Progress Notes (Signed)
Full Name: Thomas Guerrero Gender: Male MRN #: [id number] Date of Birth: [birth year]    Visit Date: 11/24/2016 10:31 Age: 71 Years 44 Months Old Examining Physician: Sarina Ill, MD  Referring Physician: Dr. Nelva Bush Primary Care Physician:  Osborne Casco, MD  History: 71 year old male here for evaluation of foot pain. Past medical history of gout, radiation, chronic kidney disease. The pain in the feet are burning especially across the toes in the left foot and in the arch. Pain is worse at night. Symptoms appeared after initiation of radiation and improved after completion of radiation. Extensive workup finds no other reason for his neuropathy: Normal labs include angiotensin-converting enzyme, Sjogren's syndrome, B12 and folate, heavy metals, vitamin B6, multiple myeloma panel, Lyme, vitamin B1. No family history of neuropathy. Extensive lab work has also been performed by primary care and rheumatology which I requested and reviewed. All muscles and remaining nerves (as indicated in the following tables) were within normal limits.    Summary: The right peroneal motor nerve showed decreased amplitude (0.6 mV, normal greater than 2). The left peroneal motor nerve showed no response. The right tibial motor nerve showed reduced amplitude (3.4 mV, normal greater than 4). The left tibial motor nerve showed reduced amplitude (3.1 mV, normal greater than 4). The right radial sensory nerve showed no response. The right sural sensory nerve showed no response. The left sural sensory nerve showed no response. The right superficial peroneal sensory nerve showed no response. The left superficial peroneal sensory nerve showed no response. The right median orthodromic sensory nerve showed no response. The right ulnar orthodromic sensory nerve showed no response. The right tibial F-wave showed delayed latency (60.9 ms, normal less than 56). The left tibial F-wave showed delayed latency (66.1 ms, normal  less than 56).     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 No acute or ongoing denervation was seen on EMG.  Cc: Dr. Nelva Bush, Dr. Jenell Milliner, M.D.  [facility] [place],  [address] Tel: [phone] Fax: [phone]        [facility]    Nerve / Sites Muscle Latency Ref. Amplitude Ref. Rel Amp Segments Distance Velocity Ref. Area    ms ms mV mV %  cm m/s m/s mVms  R Median - APB     Wrist APB 3.6 ?4.4 3.8 ?4.0 100 Wrist - APB 7   14.2     Upper arm APB 8.8  3.1  80.5 Upper arm - Wrist 24 46 ?49 11.8  R Ulnar - ADM     Wrist ADM 2.8 ?3.3 7.5 ?6.0 100 Wrist - ADM 7   19.9     B.Elbow ADM 7.0  6.4  85.5 B.Elbow - Wrist 21 49 ?49 18.9     A.Elbow ADM 9.0  5.7  89 A.Elbow - B.Elbow 10 51 ?49 18.3         A.Elbow - Wrist      R Peroneal - EDB     Ankle EDB 5.7 ?6.5 0.6 ?2.0 100 Ankle - EDB 9   1.6     Fib head EDB 13.4  0.6  113 Fib head - Ankle 29 37 ?44 1.4     Pop fossa EDB 16.0  0.6  90.3 Pop fossa - Fib head 10 38 ?44 1.9         Pop fossa - Ankle      L Peroneal - EDB  Ankle EDB NR ?6.5 NR ?2.0 NR Ankle - EDB 9   NR     Fib head EDB NR  NR  NR Fib head - Ankle   ?44 NR         Pop fossa - Ankle      R Tibial - AH     Ankle AH 4.3 ?5.8 3.4 ?4.0 100 Ankle - AH 9   9.8     Pop fossa AH 16.1  1.1  31 Pop fossa - Ankle 39 33 ?41 2.0  L Tibial - AH     Ankle AH 4.9 ?5.8 3.1 ?4.0 100 Ankle - AH 9   8.6     Pop fossa AH 17.7  1.4  45.8 Pop fossa - Ankle 39 31 ?41 3.3                 SNC    Nerve / Sites Rec. Site Peak Lat Ref.  Amp Ref. Segments Distance    ms ms V V  cm  R Radial - Anatomical snuff box (Forearm)     Forearm Wrist NR ?2.9 NR ?15 Forearm - Wrist 10  R Sural - Ankle (Calf)     Calf Ankle NR ?4.4 NR ?6 Calf - Ankle 14  L Sural - Ankle (Calf)     Calf Ankle NR ?4.4 NR ?6 Calf - Ankle 14  R Superficial peroneal - Ankle     Lat leg Ankle NR ?4.4  NR ?6 Lat leg - Ankle 14  L Superficial peroneal - Ankle     Lat leg Ankle NR ?4.4 NR ?6 Lat leg - Ankle 14  R Median - Orthodromic (Dig II, Mid palm)     Dig II Wrist NR ?3.4 NR ?10 Dig II - Wrist 13  R Ulnar - Orthodromic, (Dig V, Mid palm)     Dig V Wrist NR ?3.1 NR ?5 Dig V - Wrist 58                   F  Wave    Nerve F Lat Ref.   ms ms  R Tibial - AH 60.9 ?56.0  L Tibial - AH 66.1 ?56.0  R Ulnar - ADM 31.5 ?32.0           EMG full       EMG Summary Table    Spontaneous MUAP Recruitment  Muscle IA Fib PSW Fasc Other Amp Dur. Poly Pattern  R. Vastus medialis Normal None None None _______ Normal Normal Normal Normal  R. Vastus lateralis Normal None None None _______ Normal Normal Normal Normal  R. Tibialis anterior Normal None None None _______ Normal Normal Normal Normal  R. Gastrocnemius (Lateral head) Normal None None None _______ Normal Normal Normal Normal  R. Extensor hallucis longus Normal None None None _______ Normal Normal Normal Normal  R. Abductor hallucis Normal None None None _______ Normal Normal Normal Normal  R. Biceps femoris (long head) Normal None None None _______ Normal Normal Normal Normal

## 2016-11-24 NOTE — Telephone Encounter (Signed)
Thomas Guerrero, I thought I had requested labs from this patient's pcp and from his rheumatologist. I'll check with debra if we did, I don't remember getting anything. We may need to call pcp and rheumatology to get those faxed if possible.  thanks

## 2016-11-24 NOTE — Progress Notes (Signed)
See procedure note.

## 2016-11-24 NOTE — Telephone Encounter (Signed)
Received notes and lab results from rheumatology. Given to Dr. Jaynee Eagles, nothing further needed.

## 2016-11-24 NOTE — Addendum Note (Signed)
Addended by: Sarina Ill B on: 11/24/2016 11:14 AM   Modules accepted: Orders

## 2016-11-28 NOTE — Procedures (Signed)
Full Name: Thomas Guerrero Gender: Male MRN #: [id number] Date of Birth: [birth year]    Visit Date: 11/24/2016 10:31 Age: 71 Years 44 Months Old Examining Physician: Sarina Ill, MD  Referring Physician: Dr. Nelva Bush Primary Care Physician:  Osborne Casco, MD  History: 71 year old male here for evaluation of foot pain. Past medical history of gout, radiation, chronic kidney disease. The pain in the feet are burning especially across the toes in the left foot and in the arch. Pain is worse at night. Symptoms appeared after initiation of radiation and improved after completion of radiation. Extensive workup finds no other reason for his neuropathy: Normal labs include angiotensin-converting enzyme, Sjogren's syndrome, B12 and folate, heavy metals, vitamin B6, multiple myeloma panel, Lyme, vitamin B1. No family history of neuropathy. Extensive lab work has also been performed by primary care and rheumatology which I requested and reviewed. All muscles and remaining nerves (as indicated in the following tables) were within normal limits.    Summary: The right peroneal motor nerve showed decreased amplitude (0.6 mV, normal greater than 2). The left peroneal motor nerve showed no response. The right tibial motor nerve showed reduced amplitude (3.4 mV, normal greater than 4). The left tibial motor nerve showed reduced amplitude (3.1 mV, normal greater than 4). The right radial sensory nerve showed no response. The right sural sensory nerve showed no response. The left sural sensory nerve showed no response. The right superficial peroneal sensory nerve showed no response. The left superficial peroneal sensory nerve showed no response. The right median orthodromic sensory nerve showed no response. The right ulnar orthodromic sensory nerve showed no response. The right tibial F-wave showed delayed latency (60.9 ms, normal less than 56). The left tibial F-wave showed delayed latency (66.1 ms, normal  less than 56).     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 No acute or ongoing denervation was seen on EMG.  Cc: Dr. Nelva Bush, Dr. Jenell Milliner, M.D.  [facility] [place], [place] [address] Tel: [phone] Fax: [phone]        [facility]    Nerve / Sites Muscle Latency Ref. Amplitude Ref. Rel Amp Segments Distance Velocity Ref. Area    ms ms mV mV %  cm m/s m/s mVms  R Median - APB     Wrist APB 3.6 ?4.4 3.8 ?4.0 100 Wrist - APB 7   14.2     Upper arm APB 8.8  3.1  80.5 Upper arm - Wrist 24 46 ?49 11.8  R Ulnar - ADM     Wrist ADM 2.8 ?3.3 7.5 ?6.0 100 Wrist - ADM 7   19.9     B.Elbow ADM 7.0  6.4  85.5 B.Elbow - Wrist 21 49 ?49 18.9     A.Elbow ADM 9.0  5.7  89 A.Elbow - B.Elbow 10 51 ?49 18.3         A.Elbow - Wrist      R Peroneal - EDB     Ankle EDB 5.7 ?6.5 0.6 ?2.0 100 Ankle - EDB 9   1.6     Fib head EDB 13.4  0.6  113 Fib head - Ankle 29 37 ?44 1.4     Pop fossa EDB 16.0  0.6  90.3 Pop fossa - Fib head 10 38 ?44 1.9         Pop fossa - Ankle      L Peroneal - EDB  Ankle EDB NR ?6.5 NR ?2.0 NR Ankle - EDB 9   NR     Fib head EDB NR  NR  NR Fib head - Ankle   ?44 NR         Pop fossa - Ankle      R Tibial - AH     Ankle AH 4.3 ?5.8 3.4 ?4.0 100 Ankle - AH 9   9.8     Pop fossa AH 16.1  1.1  31 Pop fossa - Ankle 39 33 ?41 2.0  L Tibial - AH     Ankle AH 4.9 ?5.8 3.1 ?4.0 100 Ankle - AH 9   8.6     Pop fossa AH 17.7  1.4  45.8 Pop fossa - Ankle 39 31 ?41 3.3                 SNC    Nerve / Sites Rec. Site Peak Lat Ref.  Amp Ref. Segments Distance    ms ms V V  cm  R Radial - Anatomical snuff box (Forearm)     Forearm Wrist NR ?2.9 NR ?15 Forearm - Wrist 10  R Sural - Ankle (Calf)     Calf Ankle NR ?4.4 NR ?6 Calf - Ankle 14  L Sural - Ankle (Calf)     Calf Ankle NR ?4.4 NR ?6 Calf - Ankle 14  R Superficial peroneal - Ankle     Lat leg Ankle NR ?4.4  NR ?6 Lat leg - Ankle 14  L Superficial peroneal - Ankle     Lat leg Ankle NR ?4.4 NR ?6 Lat leg - Ankle 14  R Median - Orthodromic (Dig II, Mid palm)     Dig II Wrist NR ?3.4 NR ?10 Dig II - Wrist 13  R Ulnar - Orthodromic, (Dig V, Mid palm)     Dig V Wrist NR ?3.1 NR ?5 Dig V - Wrist 51                   F  Wave    Nerve F Lat Ref.   ms ms  R Tibial - AH 60.9 ?56.0  L Tibial - AH 66.1 ?56.0  R Ulnar - ADM 31.5 ?32.0           EMG full       EMG Summary Table    Spontaneous MUAP Recruitment  Muscle IA Fib PSW Fasc Other Amp Dur. Poly Pattern  R. Vastus medialis Normal None None None _______ Normal Normal Normal Normal  R. Vastus lateralis Normal None None None _______ Normal Normal Normal Normal  R. Tibialis anterior Normal None None None _______ Normal Normal Normal Normal  R. Gastrocnemius (Lateral head) Normal None None None _______ Normal Normal Normal Normal  R. Extensor hallucis longus Normal None None None _______ Normal Normal Normal Normal  R. Abductor hallucis Normal None None None _______ Normal Normal Normal Normal  R. Biceps femoris (long head) Normal None None None _______ Normal Normal Normal Normal

## 2016-11-29 LAB — VITAMIN B6: Vitamin B6: 11.2 ug/L (ref 5.3–46.7)

## 2016-11-29 LAB — B12 AND FOLATE PANEL
FOLATE: 10.4 ng/mL (ref >3.0)
VITAMIN B 12: 823 pg/mL (ref 232–1245)

## 2016-11-29 LAB — MULTIPLE MYELOMA PANEL, SERUM
ALBUMIN SERPL ELPH-MCNC: 3.6 g/dL (ref 2.9–4.4)
ALPHA 1: 0.2 g/dL (ref 0.0–0.4)
ALPHA2 GLOB SERPL ELPH-MCNC: 0.7 g/dL (ref 0.4–1.0)
Albumin/Glob SerPl: 1.3 (ref 0.7–1.7)
B-GLOBULIN SERPL ELPH-MCNC: 1.1 g/dL (ref 0.7–1.3)
GAMMA GLOB SERPL ELPH-MCNC: 0.7 g/dL (ref 0.4–1.8)
GLOBULIN, TOTAL: 2.8 g/dL (ref 2.2–3.9)
IGG (IMMUNOGLOBIN G), SERUM: 767 mg/dL (ref 700–1600)
IgA/Immunoglobulin A, Serum: 137 mg/dL (ref 61–437)
IgM (Immunoglobulin M), Srm: 55 mg/dL (ref 15–143)
M PROTEIN SERPL ELPH-MCNC: 0.2 g/dL — AB
TOTAL PROTEIN: 6.4 g/dL (ref 6.0–8.5)

## 2016-11-29 LAB — SJOGREN'S SYNDROME ANTIBODS(SSA + SSB)

## 2016-11-29 LAB — HEAVY METALS, BLOOD
Arsenic: 7 ug/L (ref 2–23)
LEAD, BLOOD: 2 ug/dL (ref 0–19)
Mercury: NOT DETECTED ug/L (ref 0.0–14.9)

## 2016-11-29 LAB — B. BURGDORFI ANTIBODIES: Lyme IgG/IgM Ab: <0.91 {ISR} (ref 0.00–0.90)

## 2016-11-29 LAB — METHYLMALONIC ACID, SERUM: METHYLMALONIC ACID: 302 nmol/L (ref 0–378)

## 2016-11-29 LAB — VITAMIN B1: Thiamine: 137.4 nmol/L (ref 66.5–200.0)

## 2016-11-29 LAB — ANGIOTENSIN CONVERTING ENZYME: Angio Convert Enzyme: <15 U/L (ref 14–82)

## 2016-12-05 ENCOUNTER — Telehealth: Payer: Self-pay

## 2016-12-05 DIAGNOSIS — D472 Monoclonal gammopathy: Secondary | ICD-10-CM

## 2016-12-05 NOTE — Telephone Encounter (Signed)
-----  Message from Melvenia Beam, MD sent at 11/30/2016  8:23 AM EDT ----- Patient's multiple myeloma panel came back abnormal (Immunofixation shows IgG monoclonal protein with lambda light chain  specificity.). We do see this often and usually it is asymptomatic but we always send patient to hematology for follow up and more blood tests. Please let the patient know and place order or let me know and I can place order thanks

## 2016-12-05 NOTE — Telephone Encounter (Signed)
Called pt w/ abnormal lab results and recommendation for hematology referral. May call back w/ additional questions/concerns.

## 2016-12-07 NOTE — Telephone Encounter (Signed)
Pt called back, reviewed abnormal lab result, agreeable to hematology referral. Voiced understanding and appreciation for call.

## 2017-01-16 ENCOUNTER — Telehealth: Payer: Self-pay | Admitting: Oncology

## 2017-01-16 NOTE — Telephone Encounter (Signed)
lvm to inform pt of 6/25 appt at 0830 per sch msg

## 2017-01-30 ENCOUNTER — Telehealth: Payer: Self-pay | Admitting: Oncology

## 2017-01-30 ENCOUNTER — Ambulatory Visit (HOSPITAL_BASED_OUTPATIENT_CLINIC_OR_DEPARTMENT_OTHER): Payer: Medicare Other | Admitting: Oncology

## 2017-01-30 VITALS — BP 155/57 | HR 98 | Temp 97.6°F | Resp 18 | Ht 69.0 in | Wt 191.6 lb

## 2017-01-30 DIAGNOSIS — E291 Testicular hypofunction: Secondary | ICD-10-CM | POA: Diagnosis not present

## 2017-01-30 DIAGNOSIS — C61 Malignant neoplasm of prostate: Secondary | ICD-10-CM

## 2017-01-30 DIAGNOSIS — M545 Low back pain: Secondary | ICD-10-CM | POA: Diagnosis not present

## 2017-01-30 DIAGNOSIS — G629 Polyneuropathy, unspecified: Secondary | ICD-10-CM | POA: Diagnosis not present

## 2017-01-30 DIAGNOSIS — M25559 Pain in unspecified hip: Secondary | ICD-10-CM | POA: Diagnosis not present

## 2017-01-30 DIAGNOSIS — D472 Monoclonal gammopathy: Secondary | ICD-10-CM

## 2017-01-30 NOTE — Telephone Encounter (Signed)
Appointments scheduled per 01/30/17 los. Patient was given a copy of the AVS report and appointment schedule, per 01/30/17 los.

## 2017-01-30 NOTE — Progress Notes (Signed)
Hematology and Oncology Follow Up Visit  Thomas Guerrero [id number] [birth year] 71 y.o. 01/30/2017 9:01 AM [name], MDGriffin, [name], MD   Principle Diagnosis: 71 year old gentleman with:  1.Prostate cancer diagnosed in January 2017. He has a Gleason score 3+4 = 7 and a PSA of 33.83. He has organ confined disease.   2. 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 No signs of end organ damage.   Prior Therapy:  He completed the radiation therapy for his prostate cancer and currently receiving androgen deprivation.  Current therapy: Androgen deprivation therapy only. Last treatment was in September 2018.  Interim History: Thomas Guerrero presents today for a follow-up visit. He is a pleasant gentleman is on consultation in 2017 for his prostate cancer. He did not have any evidence of metastatic disease at the time and received definitive therapy with radiation concomitantly with androgen deprivation. He tolerated therapy reasonably well except for mild fatigue and tiredness. He also was found to have peripheral neuropathy in his lower extremity. He has been evaluated by neurology and his workup on 11/24/2016 showed a monoclonal protein of 0.2 g/dL. His IgG level was normal without any evidence to suggest end organ damage. He is was started on gabapentin which have helped his symptoms. He denied any pathological fractures but does report occasional back pain and hip pain. He did have an MRI in September 2017 which did not show any evidence of malignancy.  He does not report any headaches, blurry vision, syncope or seizures. He does not report any fevers or chills or sweats. He does not report any cough, wheezing or hemoptysis. He does not report any nausea, vomiting or abdominal pain. He does not report any frequency urgency or hesitancy. He does not report any skeletal complaints. Remaining review of systems unremarkable.  Medications: I have reviewed  the patient's current medications.  Current Outpatient Prescriptions  Medication Sig Dispense Refill  . allopurinol (ZYLOPRIM) 300 MG tablet Take 1 tablet by mouth daily.    Marland Kitchen atorvastatin (LIPITOR) 10 MG tablet Take 10 mg by mouth daily.  11  . benazepril (LOTENSIN) 20 MG tablet   1  . cetirizine (ZYRTEC) 10 MG tablet Take 10 mg by mouth daily.    . cholecalciferol (VITAMIN D) 1000 units tablet Take 1,000 Units by mouth daily.    . clotrimazole-betamethasone (LOTRISONE) cream APPLY A THIN LAYER TO THE AFFECTED AREA 2 TIMES DAILY FOR 2 WEEKS  0  . colchicine 0.6 MG tablet TAKE 1 TABLET BY MOUTH EVERY 6 HOURS AS NEEDED FOR PAIN UNTIL GI SIDE EFFECTS.  1  . famotidine (PEPCID) 10 MG tablet Take 10 mg by mouth 2 (two) times daily. Reported on 09/18/2015    . gabapentin (NEURONTIN) 100 MG capsule Take 1 capsule (100 mg total) by mouth 3 (three) times daily. 90 capsule 11  . HYDROcodone-acetaminophen (NORCO/VICODIN) 5-325 MG tablet Take 1 tablet by mouth 2 (two) times daily.    . Misc Natural Products (TART CHERRY ADVANCED PO) Take by mouth. Reported on 01/08/2016    . traMADol (ULTRAM) 50 MG tablet TAKE 1 TABLET BY MOUTH AT BEDTIME AS NEEDED FOR PAIN IN FEET  0  . triamcinolone cream (KENALOG) 0.1 % Apply 1 application topically 2 (two) times daily as needed.     No current facility-administered medications for this visit.      Allergies: No Known Allergies  Past Medical History, Surgical history, Social history, and Family History were reviewed and updated.  Physical Exam: Blood pressure (!) 155/57, pulse 98, temperature 97.6 F (36.4 C), temperature source Oral, resp. rate 18, height _0  (1.753 m), weight 191 lb 9.6 oz (86.9 kg), SpO2 100 %. ECOG: 0 General appearance: alert and cooperative Head: Normocephalic, without obvious abnormality Neck: no adenopathy Lymph nodes: Cervical, supraclavicular, and axillary nodes normal. Heart:regular rate and rhythm, S1, S2 normal, no murmur,  click, rub or gallop Lung:chest clear, no wheezing, rales, normal symmetric air entry Abdomin: soft, non-tender, without masses or organomegaly EXT:no erythema, induration, or nodules  Results for Thomas Guerrero (MRN [id number]) as of 01/30/2017 08:52  Ref. Range 11/24/2016 13:04  Methylmalonic Acid, Serum Latest Ref Range: 0 - 378 nmol/L 302  Thiamine Latest Ref Range: 66.5 - 200.0 nmol/L 137.4  Vitamin B12 Latest Ref Range: 232 - 1,245 pg/mL 823  Vitamin B6 Latest Ref Range: 5.3 - 46.7 ug/L 11.2  Albumin SerPl Elph-Mcnc Latest Ref Range: 2.9 - 4.4 g/dL 3.6  Albumin/Glob SerPl Latest Ref Range: 0.7 - 1.7  1.3  Alpha2 Glob SerPl Elph-Mcnc Latest Ref Range: 0.4 - 1.0 g/dL 0.7  Alpha 1 Latest Ref Range: 0.0 - 0.4 g/dL 0.2  Gamma Glob SerPl Elph-Mcnc Latest Ref Range: 0.4 - 1.8 g/dL 0.7  IFE 1 Unknown Comment  Globulin, Total Latest Ref Range: 2.2 - 3.9 g/dL 2.8  B-Globulin SerPl Elph-Mcnc Latest Ref Range: 0.7 - 1.3 g/dL 1.1  IgG (Immunoglobin G), Serum Latest Ref Range: 700 - 1,600 mg/dL 767  IgM (Immunoglobulin M), Srm Latest Ref Range: 15 - 143 mg/dL 55  M Protein SerPl Elph-Mcnc Latest Ref Range: Not Observed g/dL 0.2 (H)     Radiological Studies: EXAM: MRI THORACIC AND LUMBAR SPINE WITHOUT CONTRAST  TECHNIQUE: Multiplanar and multiecho pulse sequences of the thoracic and lumbar spine were obtained without intravenous contrast.  COMPARISON:  Whole-body bone scan 09/22/2015.  FINDINGS: MRI THORACIC SPINE FINDINGS  Limited sagittal imaging the cervical spine remarkable for a fairly advanced and widespread disc and endplate degeneration. Degenerative spinal stenosis evident at C3-C4.  Alignment: Mild dextro convex thoracic scoliosis, mildly exaggerated upper thoracic kyphosis.  Thoracic segmentation appears normal.  Vertebrae: Benign vertebral body hemangioma in T11. Elsewhere thoracic spine bone marrow signal is within normal limits. No marrow edema or evidence  of acute osseous abnormality.  Cord: Spinal cord signal is within normal limits at all visualized levels. Conus medullaris is normal at T12-L1.  Paraspinal and other soft tissues: Negative visualized thoracic and upper abdominal viscera. Negative visualized posterior paraspinal soft tissues.  Disc levels:  Thoracic spine degeneration with intermittent small thoracic disc herniations. The most pronounced disc disease at T7-T8 where there is a right paracentral disc extrusion as seen on series 15, image 24. This results in spinal stenosis with mild spinal cord mass effect. No cord signal abnormality. No significant thoracic spinal stenosis elsewhere.  MRI LUMBAR SPINE FINDINGS  Segmentation: Normal lumbar segmentation, concordant with the thoracic spine numbering.  Alignment:  Normal.  Vertebrae: Mildly heterogeneous bone marrow signal in the upper lumbar spine. Increased T1 bone marrow signal in the lower lumbar spine and visible pelvis suggesting previous radiation therapy. No suspicious marrow lesion. No marrow edema or evidence of acute osseous abnormality.  Conus medullaris: Extends to the T12-L1 level and appears normal. Cauda equina nerve roots are within normal limits.  Paraspinal and other soft tissues: Negative visualized abdominal viscera. Negative visualized posterior paraspinal soft tissues.  Disc levels:  Lumbar spine degeneration with significant stenosis at:  L2-L3: Right eccentric circumferential  disc bulge with endplate spurring and facet hypertrophy. Mild to moderate right lateral recess and right L2 foraminal stenosis.  L4-L5: Circumferential disc bulge with broad-based posterior component. Moderate facet and ligament flavum hypertrophy. Mild spinal and lateral recess stenosis. Mild bilateral L4 foraminal stenosis.  IMPRESSION: MR THORACIC SPINE IMPRESSION  1. No osseous metastatic disease in the thoracic spine. 2. Thoracic spine  degeneration with degenerative spinal stenosis at T7-T8 resulting in spinal cord mass effect but no thoracic spinal cord signal abnormality. 3. Partially visible advanced cervical spine degeneration with degenerative cervical spinal stenosis.  MR LUMBAR SPINE IMPRESSION  1. No osseous metastatic disease in the lumbar spine. Evidence of previous lower lumbar and pelvic radiation. 2. Degenerative mild lumbar spinal stenosis and other neural impingement at L4-L5, and L2-L3.   EXAM: NUCLEAR MEDICINE WHOLE BODY BONE SCAN  TECHNIQUE: Whole body anterior and posterior images were obtained approximately 3 hours after intravenous injection of radiopharmaceutical.  RADIOPHARMACEUTICALS:  23.0 mCi Technetium-92mMDP IV  COMPARISON:  None.  FINDINGS: Slight increased activity noted in the knee joints and shoulders bilaterally most compatible with degenerative uptake. Uptake in the maxillary region bilaterally, left greater than right, likely related to periodontal disease. No suspicious bony uptake to suggest osseous metastatic disease. Soft tissue activity is normal.  IMPRESSION: No evidence of osseous metastatic disease.     Impression and Plan:  71year old gentleman with: 1. Prostate cancer diagnosed in January 2017. He presented with a PSA of 33.83 and found to have a Gleason score of 3+4 = 7 as well as Gleason score 3+3 = 6 in total of 11 out of 12 cores. His workup did not show any evidence of advanced disease.  He completed radiation therapy and currently receiving androgen depravation therapy to conclude in September 2018. He has no evidence of recurrence with imaging studies in September 2017 showed no metastatic disease.  2. Monoclonal gammopathy: He presented with an M spike of 0.2 g/dL and immunofixation showed an IgG lambda without any evidence of end organ damage. 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  The natural course of this disease was discussed today with the patient. I recommended continuing follow-up and repeat protein studies in 6 months. He had multiple imaging studies without any evidence of bone involvement. I see no need for a bone marrow biopsy at this time given the low M spike. But if he develops any cytopenias in the future or otherwise in his protein studies, we will consider staging him as a bone marrow biopsy.  3. Neuropathy: I do not think this is related to a plasma cell disorder. He is currently on Neurontin and follows with nephrology.  4. Follow-up: Will be in January 2019 to repeat his protein studies.   [facility] 6/25/20189:01 AM

## 2017-04-03 DIAGNOSIS — E7800 Pure hypercholesterolemia, unspecified: Secondary | ICD-10-CM | POA: Diagnosis not present

## 2017-04-03 DIAGNOSIS — I129 Hypertensive chronic kidney disease with stage 1 through stage 4 chronic kidney disease, or unspecified chronic kidney disease: Secondary | ICD-10-CM | POA: Diagnosis not present

## 2017-04-03 DIAGNOSIS — N183 Chronic kidney disease, stage 3 (moderate): Secondary | ICD-10-CM | POA: Diagnosis not present

## 2017-04-03 DIAGNOSIS — M109 Gout, unspecified: Secondary | ICD-10-CM | POA: Diagnosis not present

## 2017-04-03 DIAGNOSIS — L309 Dermatitis, unspecified: Secondary | ICD-10-CM | POA: Diagnosis not present

## 2017-04-11 ENCOUNTER — Encounter: Payer: Self-pay | Admitting: *Deleted

## 2017-04-12 DIAGNOSIS — C61 Malignant neoplasm of prostate: Secondary | ICD-10-CM | POA: Diagnosis not present

## 2017-04-19 DIAGNOSIS — C61 Malignant neoplasm of prostate: Secondary | ICD-10-CM | POA: Diagnosis not present

## 2017-08-25 ENCOUNTER — Inpatient Hospital Stay: Payer: Medicare Other | Attending: Oncology

## 2017-08-25 DIAGNOSIS — D649 Anemia, unspecified: Secondary | ICD-10-CM | POA: Insufficient documentation

## 2017-08-25 DIAGNOSIS — C61 Malignant neoplasm of prostate: Secondary | ICD-10-CM | POA: Diagnosis not present

## 2017-08-25 DIAGNOSIS — D472 Monoclonal gammopathy: Secondary | ICD-10-CM | POA: Diagnosis not present

## 2017-08-25 DIAGNOSIS — N289 Disorder of kidney and ureter, unspecified: Secondary | ICD-10-CM | POA: Diagnosis not present

## 2017-08-25 DIAGNOSIS — G629 Polyneuropathy, unspecified: Secondary | ICD-10-CM | POA: Diagnosis not present

## 2017-08-25 LAB — CBC WITH DIFFERENTIAL/PLATELET
Basophils Absolute: 0 10*3/uL (ref 0.0–0.1)
Basophils Relative: 1 %
EOS ABS: 0.2 10*3/uL (ref 0.0–0.5)
Eosinophils Relative: 5 %
HCT: 31 % — ABNORMAL LOW (ref 38.4–49.9)
HEMOGLOBIN: 10.2 g/dL — AB (ref 13.0–17.1)
LYMPHS ABS: 0.8 10*3/uL — AB (ref 0.9–3.3)
LYMPHS PCT: 18 %
MCH: 32.9 pg (ref 27.2–33.4)
MCHC: 33 g/dL (ref 32.0–36.0)
MCV: 99.9 fL — ABNORMAL HIGH (ref 79.3–98.0)
Monocytes Absolute: 0.3 10*3/uL (ref 0.1–0.9)
Monocytes Relative: 6 %
NEUTROS PCT: 70 %
Neutro Abs: 3.3 10*3/uL (ref 1.5–6.5)
Platelets: 139 10*3/uL — ABNORMAL LOW (ref 140–400)
RBC: 3.1 MIL/uL — AB (ref 4.20–5.82)
RDW: 13.6 % (ref 11.0–15.6)
WBC: 4.7 10*3/uL (ref 4.0–10.3)

## 2017-08-25 LAB — COMPREHENSIVE METABOLIC PANEL
ALT: 18 U/L (ref 0–55)
AST: 21 U/L (ref 5–34)
Albumin: 3.9 g/dL (ref 3.5–5.0)
Alkaline Phosphatase: 74 U/L (ref 40–150)
Anion gap: 10 (ref 3–11)
BUN: 37 mg/dL — ABNORMAL HIGH (ref 7–26)
CHLORIDE: 108 mmol/L (ref 98–109)
CO2: 22 mmol/L (ref 22–29)
CREATININE: 2.92 mg/dL — AB (ref 0.70–1.30)
Calcium: 10.8 mg/dL — ABNORMAL HIGH (ref 8.4–10.4)
GFR, EST AFRICAN AMERICAN: 23 mL/min — AB (ref >60)
GFR, EST NON AFRICAN AMERICAN: 20 mL/min — AB (ref >60)
Glucose, Bld: 112 mg/dL (ref 70–140)
POTASSIUM: 4.5 mmol/L (ref 3.5–5.1)
Sodium: 140 mmol/L (ref 136–145)
Total Bilirubin: 0.5 mg/dL (ref 0.2–1.2)
Total Protein: 7.1 g/dL (ref 6.4–8.3)

## 2017-08-28 LAB — KAPPA/LAMBDA LIGHT CHAINS
KAPPA FREE LGHT CHN: 31.9 mg/L — AB (ref 3.3–19.4)
Kappa, lambda light chain ratio: 1.14 (ref 0.26–1.65)
LAMDA FREE LIGHT CHAINS: 28.1 mg/L — AB (ref 5.7–26.3)

## 2017-08-29 LAB — MULTIPLE MYELOMA PANEL, SERUM
ALBUMIN/GLOB SERPL: 1.4 (ref 0.7–1.7)
Albumin SerPl Elph-Mcnc: 3.8 g/dL (ref 2.9–4.4)
Alpha 1: 0.2 g/dL (ref 0.0–0.4)
Alpha2 Glob SerPl Elph-Mcnc: 0.7 g/dL (ref 0.4–1.0)
B-Globulin SerPl Elph-Mcnc: 1.2 g/dL (ref 0.7–1.3)
GAMMA GLOB SERPL ELPH-MCNC: 0.7 g/dL (ref 0.4–1.8)
Globulin, Total: 2.9 g/dL (ref 2.2–3.9)
IgA: 142 mg/dL (ref 61–437)
IgG (Immunoglobin G), Serum: 875 mg/dL (ref 700–1600)
IgM (Immunoglobulin M), Srm: 40 mg/dL (ref 15–143)
TOTAL PROTEIN ELP: 6.7 g/dL (ref 6.0–8.5)

## 2017-09-01 ENCOUNTER — Telehealth: Payer: Self-pay | Admitting: Oncology

## 2017-09-01 ENCOUNTER — Inpatient Hospital Stay (HOSPITAL_BASED_OUTPATIENT_CLINIC_OR_DEPARTMENT_OTHER): Payer: Medicare Other | Admitting: Oncology

## 2017-09-01 VITALS — BP 167/76 | HR 85 | Temp 98.2°F | Resp 17 | Ht 69.0 in | Wt 196.2 lb

## 2017-09-01 DIAGNOSIS — D472 Monoclonal gammopathy: Secondary | ICD-10-CM

## 2017-09-01 DIAGNOSIS — C61 Malignant neoplasm of prostate: Secondary | ICD-10-CM

## 2017-09-01 DIAGNOSIS — G629 Polyneuropathy, unspecified: Secondary | ICD-10-CM

## 2017-09-01 DIAGNOSIS — N289 Disorder of kidney and ureter, unspecified: Secondary | ICD-10-CM | POA: Diagnosis not present

## 2017-09-01 DIAGNOSIS — D649 Anemia, unspecified: Secondary | ICD-10-CM | POA: Diagnosis not present

## 2017-09-01 NOTE — Telephone Encounter (Signed)
No 1/25 los.

## 2017-09-01 NOTE — Progress Notes (Signed)
Hematology and Oncology Follow Up Visit  Thomas Guerrero [id number] 11-02-1945 72 y.o. 09/01/2017 8:31 AM Thomas Guerrero, MDGriffin, Thomas Sheffield, MD   Principle Diagnosis: 72 year old gentleman with:  1.Prostate cancer diagnosed in January 2017. He has a Gleason score 3+4 = 7 and a PSA of 33.83.  He completed definitive treatment without recurrent disease.  2. 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  This has resolved at this time without any evidence of a plasma cell disorder.   Prior Therapy:  He completed the radiation therapy for his prostate cancer and currently receiving androgen deprivation.  Current therapy: Observation and surveillance.  Interim History: [name] here for a follow-up visit.  Since the last visit, he reports no changes in his health.  He completed androgen deprivation therapy without any delayed complications.  He does report decrease in his muscle mass but no excessive fatigue or tiredness.  He did report some occasional hot flashes.  He is ambulating without any major difficulties although he does have neuropathy that has been chronic.  He denies any bone pain, pathological fractures or recurrent infections.  He denied any worsening neurological complaints.  He denies any urinary symptoms including hematuria or dysuria.  He does not report any headaches, blurry vision, syncope or seizures. He does not report any fevers or chills or sweats. He does not report any cough, wheezing or hemoptysis. He does not report any nausea, vomiting or abdominal pain. He does not report any frequency urgency or hesitancy. He does not report any skeletal complaints.  He does not report any skin rashes or lesions.  No lymphadenopathy or petechiae.  Remaining review of systems is negative.   Medications: I have reviewed the patient's current medications.  Current Outpatient Medications  Medication Sig Dispense Refill  . allopurinol  (ZYLOPRIM) 300 MG tablet Take 1 tablet by mouth daily.    Marland Kitchen atorvastatin (LIPITOR) 10 MG tablet Take 10 mg by mouth daily.  11  . benazepril (LOTENSIN) 20 MG tablet   1  . cetirizine (ZYRTEC) 10 MG tablet Take 10 mg by mouth daily.    . cholecalciferol (VITAMIN D) 1000 units tablet Take 1,000 Units by mouth daily.    . clotrimazole-betamethasone (LOTRISONE) cream APPLY A THIN LAYER TO THE AFFECTED AREA 2 TIMES DAILY FOR 2 WEEKS  0  . colchicine 0.6 MG tablet TAKE 1 TABLET BY MOUTH EVERY 6 HOURS AS NEEDED FOR PAIN UNTIL GI SIDE EFFECTS.  1  . famotidine (PEPCID) 10 MG tablet Take 10 mg by mouth 2 (two) times daily. Reported on 09/18/2015    . gabapentin (NEURONTIN) 100 MG capsule Take 1 capsule (100 mg total) by mouth 3 (three) times daily. 90 capsule 11  . HYDROcodone-acetaminophen (NORCO/VICODIN) 5-325 MG tablet Take 1 tablet by mouth 2 (two) times daily.    . Misc Natural Products (TART CHERRY ADVANCED PO) Take by mouth. Reported on 01/08/2016    . traMADol (ULTRAM) 50 MG tablet TAKE 1 TABLET BY MOUTH AT BEDTIME AS NEEDED FOR PAIN IN FEET  0  . triamcinolone cream (KENALOG) 0.1 % Apply 1 application topically 2 (two) times daily as needed.     No current facility-administered medications for this visit.      Allergies: No Known Allergies  Past Medical History, Surgical history, Social history, and Family History were reviewed and updated.   Physical Exam: Blood pressure (!) 167/76, pulse 85, temperature 98.2 F (36.8 C), temperature source Oral, resp. rate  17, height '5\' 9"'  (1.753 m), weight 196 lb 3.2 oz (89 kg), SpO2 97 %. ECOG: 0 General appearance: alert and cooperative appeared without distress. Head: Normocephalic, without obvious abnormality  Oral mucosa: Without oral thrush or ulcers. Eyes: No scleral icterus. Lymph nodes: Cervical, supraclavicular, and axillary nodes normal. Heart:regular rate and rhythm, S1, S2 normal, no murmur, click, rub or gallop Lung:chest to  auscultation in all lung fields.  No wheezing, rales Abdomin: soft, non-tender, without masses or organomegaly Musculoskeletal: No joint deformity or effusion. Skin: No rashes or lesions.  No petechiae.  Results for Thomas Guerrero, Thomas Guerrero (MRN [id number]) as of 09/01/2017 08:07  Ref. Range 08/25/2017 09:24 08/25/2017 09:25  IFE 1 Unknown Comment   Globulin, Total Latest Ref Range: 2.2 - 3.9 g/dL 2.9   B-Globulin SerPl Elph-Mcnc Latest Ref Range: 0.7 - 1.3 g/dL 1.2   IgG (Immunoglobin G), Serum Latest Ref Range: 700 - 1,600 mg/dL 875   IgA Latest Ref Range: 61 - 437 mg/dL 142   IgM (Immunoglobulin M), Srm Latest Ref Range: 15 - 143 mg/dL 40   Kappa free light chain Latest Ref Range: 3.3 - 19.4 mg/L  31.9 (H)  Lamda free light chains Latest Ref Range: 5.7 - 26.3 mg/L  28.1 (H)  Kappa, lamda light chain ratio Latest Ref Range: 0.26 - 1.65   1.14  M Protein SerPl Elph-Mcnc Latest Ref Range: Not Observed g/dL Not Observed       Impression and Plan:  72 year old gentleman with:  1. Prostate cancer diagnosed in January 2017. He presented with a PSA of 33.83 and found to have a Gleason score of 3+4 = 7.  He has organ confined disease.  He completed radiation therapy and androgen depravation therapy in September 2018.   He will continue to follow-up with [facility] in the future.  No clinical signs or symptoms of relapse at this time.  Natural course of this disease was reviewed today and he understands he is at risk of developing relapse in the future and will continue to follow with periodic PSA check at [facility].     2. Monoclonal gammopathy: He presented with an M spike of 0.2 g/dL and immunofixation showed an IgG lambda without any evidence of end organ damage.   Protein studies obtained in January 2019 was reviewed today and showed no evidence of a plasma cell disorder.  His immunofixation is no longer detecting and a monoclonal protein.  His kapa and lambda light chains are  elevated with normal ratio.  The differential diagnosis was discussed today with the patient.  These findings today suggest reactive polyclonal elevation and not a plasma cell disorder.  I see no evidence to suggest multiple myeloma, MGUS or amyloidosis.  No further follow-up is needed regarding this issue.  3. Neuropathy: Not related to a plasma cell disorder.  He follows with neurology regarding this issue.  4.  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  His hemoglobin is 10.2 and does not require growth factor support.  Aranesp can be considered in the future if his hemoglobin drops further.  5.  Renal insufficiency: I recommend nephrology evaluation moving forward.  He follows with his primary care physician regarding this issue.  6.  Follow-up: I am happy to see him in the future as needed.  15  minutes was spent with the patient face-to-face today.  More than 50% of time was dedicated to patient counseling, education and coordination of the patient's multifaceted  care.    Zola Button, MD 1/25/20198:31 AM

## 2017-09-21 DIAGNOSIS — L309 Dermatitis, unspecified: Secondary | ICD-10-CM | POA: Diagnosis not present

## 2017-09-21 DIAGNOSIS — Z23 Encounter for immunization: Secondary | ICD-10-CM | POA: Diagnosis not present

## 2017-09-21 DIAGNOSIS — D1801 Hemangioma of skin and subcutaneous tissue: Secondary | ICD-10-CM | POA: Diagnosis not present

## 2017-09-21 DIAGNOSIS — L570 Actinic keratosis: Secondary | ICD-10-CM | POA: Diagnosis not present

## 2017-09-21 DIAGNOSIS — D225 Melanocytic nevi of trunk: Secondary | ICD-10-CM | POA: Diagnosis not present

## 2017-09-21 DIAGNOSIS — L814 Other melanin hyperpigmentation: Secondary | ICD-10-CM | POA: Diagnosis not present

## 2017-09-21 DIAGNOSIS — L821 Other seborrheic keratosis: Secondary | ICD-10-CM | POA: Diagnosis not present

## 2017-09-21 DIAGNOSIS — D2261 Melanocytic nevi of right upper limb, including shoulder: Secondary | ICD-10-CM | POA: Diagnosis not present

## 2017-10-04 DIAGNOSIS — Z0000 Encounter for general adult medical examination without abnormal findings: Secondary | ICD-10-CM | POA: Diagnosis not present

## 2017-10-04 DIAGNOSIS — I129 Hypertensive chronic kidney disease with stage 1 through stage 4 chronic kidney disease, or unspecified chronic kidney disease: Secondary | ICD-10-CM | POA: Diagnosis not present

## 2017-10-04 DIAGNOSIS — M4800 Spinal stenosis, site unspecified: Secondary | ICD-10-CM | POA: Diagnosis not present

## 2017-10-04 DIAGNOSIS — M109 Gout, unspecified: Secondary | ICD-10-CM | POA: Diagnosis not present

## 2017-10-04 DIAGNOSIS — E7800 Pure hypercholesterolemia, unspecified: Secondary | ICD-10-CM | POA: Diagnosis not present

## 2017-10-04 DIAGNOSIS — C61 Malignant neoplasm of prostate: Secondary | ICD-10-CM | POA: Diagnosis not present

## 2017-10-04 DIAGNOSIS — N183 Chronic kidney disease, stage 3 (moderate): Secondary | ICD-10-CM | POA: Diagnosis not present

## 2017-10-04 DIAGNOSIS — Z1159 Encounter for screening for other viral diseases: Secondary | ICD-10-CM | POA: Diagnosis not present

## 2017-10-04 DIAGNOSIS — Z23 Encounter for immunization: Secondary | ICD-10-CM | POA: Diagnosis not present

## 2017-10-18 DIAGNOSIS — C61 Malignant neoplasm of prostate: Secondary | ICD-10-CM | POA: Diagnosis not present

## 2017-10-25 DIAGNOSIS — C61 Malignant neoplasm of prostate: Secondary | ICD-10-CM | POA: Diagnosis not present

## 2017-10-30 ENCOUNTER — Other Ambulatory Visit: Payer: Self-pay | Admitting: Neurology

## 2018-04-05 DIAGNOSIS — C61 Malignant neoplasm of prostate: Secondary | ICD-10-CM | POA: Diagnosis not present

## 2018-04-05 DIAGNOSIS — M549 Dorsalgia, unspecified: Secondary | ICD-10-CM | POA: Diagnosis not present

## 2018-04-05 DIAGNOSIS — I129 Hypertensive chronic kidney disease with stage 1 through stage 4 chronic kidney disease, or unspecified chronic kidney disease: Secondary | ICD-10-CM | POA: Diagnosis not present

## 2018-04-05 DIAGNOSIS — G629 Polyneuropathy, unspecified: Secondary | ICD-10-CM | POA: Diagnosis not present

## 2018-04-05 DIAGNOSIS — M109 Gout, unspecified: Secondary | ICD-10-CM | POA: Diagnosis not present

## 2018-04-05 DIAGNOSIS — N184 Chronic kidney disease, stage 4 (severe): Secondary | ICD-10-CM | POA: Diagnosis not present

## 2018-04-05 DIAGNOSIS — E7800 Pure hypercholesterolemia, unspecified: Secondary | ICD-10-CM | POA: Diagnosis not present

## 2018-05-09 DIAGNOSIS — C61 Malignant neoplasm of prostate: Secondary | ICD-10-CM | POA: Diagnosis not present

## 2018-05-16 DIAGNOSIS — N5201 Erectile dysfunction due to arterial insufficiency: Secondary | ICD-10-CM | POA: Diagnosis not present

## 2018-05-16 DIAGNOSIS — C61 Malignant neoplasm of prostate: Secondary | ICD-10-CM | POA: Diagnosis not present

## 2018-06-18 ENCOUNTER — Other Ambulatory Visit: Payer: Self-pay | Admitting: Neurology

## 2018-07-13 DIAGNOSIS — N189 Chronic kidney disease, unspecified: Secondary | ICD-10-CM | POA: Diagnosis not present

## 2018-07-13 DIAGNOSIS — I129 Hypertensive chronic kidney disease with stage 1 through stage 4 chronic kidney disease, or unspecified chronic kidney disease: Secondary | ICD-10-CM | POA: Diagnosis not present

## 2018-07-13 DIAGNOSIS — D472 Monoclonal gammopathy: Secondary | ICD-10-CM | POA: Diagnosis not present

## 2018-07-13 DIAGNOSIS — D631 Anemia in chronic kidney disease: Secondary | ICD-10-CM | POA: Diagnosis not present

## 2018-07-13 DIAGNOSIS — N184 Chronic kidney disease, stage 4 (severe): Secondary | ICD-10-CM | POA: Diagnosis not present

## 2018-07-13 DIAGNOSIS — M109 Gout, unspecified: Secondary | ICD-10-CM | POA: Diagnosis not present

## 2018-07-13 DIAGNOSIS — E669 Obesity, unspecified: Secondary | ICD-10-CM | POA: Diagnosis not present

## 2018-07-17 ENCOUNTER — Other Ambulatory Visit: Payer: Self-pay | Admitting: Nephrology

## 2018-07-17 DIAGNOSIS — N184 Chronic kidney disease, stage 4 (severe): Secondary | ICD-10-CM

## 2018-07-19 ENCOUNTER — Other Ambulatory Visit: Payer: Self-pay | Admitting: Neurology

## 2018-08-07 ENCOUNTER — Ambulatory Visit
Admission: RE | Admit: 2018-08-07 | Discharge: 2018-08-07 | Disposition: A | Discharge status: Home / Self Care | Payer: Medicare Other | Source: Ambulatory Visit | Attending: Nephrology | Admitting: Nephrology

## 2018-08-07 DIAGNOSIS — N184 Chronic kidney disease, stage 4 (severe): Secondary | ICD-10-CM

## 2018-08-15 ENCOUNTER — Other Ambulatory Visit: Payer: Self-pay | Admitting: Nephrology

## 2018-08-15 DIAGNOSIS — R93429 Abnormal radiologic findings on diagnostic imaging of unspecified kidney: Secondary | ICD-10-CM

## 2018-08-27 ENCOUNTER — Ambulatory Visit
Admission: RE | Admit: 2018-08-27 | Discharge: 2018-08-27 | Disposition: A | Discharge status: Home / Self Care | Payer: Medicare Other | Source: Ambulatory Visit | Attending: Nephrology | Admitting: Nephrology

## 2018-08-27 DIAGNOSIS — K760 Fatty (change of) liver, not elsewhere classified: Secondary | ICD-10-CM | POA: Diagnosis not present

## 2018-08-27 DIAGNOSIS — N289 Disorder of kidney and ureter, unspecified: Secondary | ICD-10-CM | POA: Diagnosis not present

## 2018-08-27 DIAGNOSIS — R93429 Abnormal radiologic findings on diagnostic imaging of unspecified kidney: Secondary | ICD-10-CM

## 2018-08-31 ENCOUNTER — Other Ambulatory Visit: Payer: Self-pay | Admitting: Neurology

## 2018-09-03 ENCOUNTER — Telehealth: Payer: Self-pay | Admitting: Neurology

## 2018-09-03 NOTE — Telephone Encounter (Signed)
Pts wife states that he is needing a refill on his gabapentin (NEURONTIN) 100 MG capsule but the pharmacy is needing a PA for it. Please advise.

## 2018-09-03 NOTE — Telephone Encounter (Signed)
Spoke with Dr.Ahern. Pt last seen April 2018. Will advise pt can be seen by PCP and get his gabapentin through them. If pt prefers to be seen by our office, can schedule f/u with Amy and pt can use goodrx for the Gabapentin.

## 2018-09-04 MED ORDER — GABAPENTIN 100 MG PO CAPS: ORAL_CAPSULE | ORAL | 0 refills | Status: DC

## 2018-09-04 NOTE — Telephone Encounter (Signed)
Called pt's wife and LVM asking for call back. Left office number in message.

## 2018-09-04 NOTE — Addendum Note (Signed)
Addended by: Gildardo Griffes on: 09/04/2018 09:01 AM   Modules accepted: Orders

## 2018-09-04 NOTE — Telephone Encounter (Addendum)
Caren Griffins, pt's wife, returned my call. We discussed that appt is needed d/t pt not being seen in over a year. Alternatively, he can see pcp for refills. She spoke with pt and decided to be seen here. She reported no new issues. I scheduled pt with Amy NP on 09/20/2018 @ 11:00 arrival 10:30. I asked her about the PA. She said the refill had been rejected likely because he had not been seen. I told her that we could provide a refill with enough to get him through his appt and the pharmacy would contact us if PA needed. I also suggested good rx as an additional discount. She verbalized appreciation.

## 2018-09-18 DIAGNOSIS — L570 Actinic keratosis: Secondary | ICD-10-CM | POA: Diagnosis not present

## 2018-09-18 DIAGNOSIS — D225 Melanocytic nevi of trunk: Secondary | ICD-10-CM | POA: Diagnosis not present

## 2018-09-18 DIAGNOSIS — Z23 Encounter for immunization: Secondary | ICD-10-CM | POA: Diagnosis not present

## 2018-09-18 DIAGNOSIS — D1801 Hemangioma of skin and subcutaneous tissue: Secondary | ICD-10-CM | POA: Diagnosis not present

## 2018-09-18 DIAGNOSIS — L821 Other seborrheic keratosis: Secondary | ICD-10-CM | POA: Diagnosis not present

## 2018-09-20 ENCOUNTER — Encounter: Payer: Self-pay | Admitting: Family Medicine

## 2018-09-20 ENCOUNTER — Ambulatory Visit (INDEPENDENT_AMBULATORY_CARE_PROVIDER_SITE_OTHER): Payer: Medicare Other | Admitting: Family Medicine

## 2018-09-20 VITALS — BP 166/81 | HR 77 | Ht 69.0 in | Wt 208.8 lb

## 2018-09-20 DIAGNOSIS — G629 Polyneuropathy, unspecified: Secondary | ICD-10-CM | POA: Diagnosis not present

## 2018-09-20 MED ORDER — GABAPENTIN 300 MG PO CAPS: 300.0000 mg | ORAL_CAPSULE | Freq: Three times a day (TID) | ORAL | 11 refills | Status: DC

## 2018-09-20 NOTE — Patient Instructions (Addendum)
We will change gabapentin to $RemoveBefor'300mg'daqTryOZdlZb$  twice daily (may take up to three times a day if needed) Consider PT for worsening Follow up in 1 year, sooner if needed   Peripheral Neuropathy Peripheral neuropathy is a type of nerve damage. It affects nerves that carry signals between the spinal cord and the arms, legs, and the rest of the body (peripheral nerves). It does not affect nerves in the spinal cord or brain. In peripheral neuropathy, one nerve or a group of nerves may be damaged. Peripheral neuropathy is a broad category that includes many specific nerve disorders, like diabetic neuropathy, hereditary neuropathy, and carpal tunnel syndrome. What are the causes? This condition may be caused by:  Diabetes. This is the most common cause of peripheral neuropathy.  Nerve injury.  Pressure or stress on a nerve that lasts a long time.  Lack (deficiency) of B vitamins. This can result from alcoholism, poor diet, or a restricted diet.  Infections.  Autoimmune diseases, such as rheumatoid arthritis and systemic lupus erythematosus.  Nerve diseases that are passed from parent to child (inherited).  Some medicines, such as cancer medicines (chemotherapy).  Poisonous (toxic) substances, such as lead and mercury.  Too little blood flowing to the legs.  Kidney disease.  Thyroid disease. In some cases, the cause of this condition is not known. What are the signs or symptoms? Symptoms of this condition depend on which of your nerves is damaged. Common symptoms include:  Loss of feeling (numbness) in the feet, hands, or both.  Tingling in the feet, hands, or both.  Burning pain.  Very sensitive skin.  Weakness.  Not being able to move a part of the body (paralysis).  Muscle twitching.  Clumsiness or poor coordination.  Loss of balance.  Not being able to control your bladder.  Feeling dizzy.  Sexual problems. How is this diagnosed? Diagnosing and finding the cause of  peripheral neuropathy can be difficult. Your health care provider will take your medical history and do a physical exam. A neurological exam will also be done. This involves checking things that are affected by your brain, spinal cord, and nerves (nervous system). For example, your health care provider will check your reflexes, how you move, and what you can feel. You may have other tests, such as:  Blood tests.  Electromyogram (EMG) and nerve conduction tests. These tests check nerve function and how well the nerves are controlling the muscles.  Imaging tests, such as CT scans or MRI to rule out other causes of your symptoms.  Removing a small piece of nerve to be examined in a lab (nerve biopsy). This is rare.  Removing and examining a small amount of the fluid that surrounds the brain and spinal cord (lumbar puncture). This is rare. How is this treated? Treatment for this condition may involve:  Treating the underlying cause of the neuropathy, such as diabetes, kidney disease, or vitamin deficiencies.  Stopping medicines that can cause neuropathy, such as chemotherapy.  Medicine to relieve pain. Medicines may include: ? Prescription or over-the-counter pain medicine. ? Antiseizure medicine. ? Antidepressants. ? Pain-relieving patches that are applied to painful areas of skin.  Surgery to relieve pressure on a nerve or to destroy a nerve that is causing pain.  Physical therapy to help improve movement and balance.  Devices to help you move around (assistive devices). Follow these instructions at home: Medicines  Take over-the-counter and prescription medicines only as told by your health care provider. Do not take any other  medicines without first asking your health care provider.  Do not drive or use heavy machinery while taking prescription pain medicine. Lifestyle   Do not use any products that contain nicotine or tobacco, such as cigarettes and e-cigarettes. Smoking keeps  blood from reaching damaged nerves. If you need help quitting, ask your health care provider.  Avoid or limit alcohol. Too much alcohol can cause a vitamin B deficiency, and vitamin B is needed for healthy nerves.  Eat a healthy diet. This includes: ? Eating foods that are high in fiber, such as fresh fruits and vegetables, whole grains, and beans. ? Limiting foods that are high in fat and processed sugars, such as fried or sweet foods. General instructions   If you have diabetes, work closely with your health care provider to keep your blood sugar under control.  If you have numbness in your feet: ? Check every day for signs of injury or infection. Watch for redness, warmth, and swelling. ? Wear padded socks and comfortable shoes. These help protect your feet.  Develop a good support system. Living with peripheral neuropathy can be stressful. Consider talking with a mental health specialist or joining a support group.  Use assistive devices and attend physical therapy as told by your health care provider. This may include using a walker or a cane.  Keep all follow-up visits as told by your health care provider. This is important. Contact a health care provider if:  You have new signs or symptoms of peripheral neuropathy.  You are struggling emotionally from dealing with peripheral neuropathy.  Your pain is not well-controlled. Get help right away if:  You have an injury or infection that is not healing normally.  You develop new weakness in an arm or leg.  You fall frequently. Summary  Peripheral neuropathy is when the nerves in the arms, or legs are damaged, resulting in numbness, weakness, or pain.  There are many causes of peripheral neuropathy, including diabetes, pinched nerves, vitamin deficiencies, autoimmune disease, and hereditary conditions.  Diagnosing and finding the cause of peripheral neuropathy can be difficult. Your health care provider will take your medical  history, do a physical exam, and do tests, including blood tests and nerve function tests.  Treatment involves treating the underlying cause of the neuropathy and taking medicines to help control pain. Physical therapy and assistive devices may also help. This information is not intended to replace advice given to you by your health care provider. Make sure you discuss any questions you have with your health care provider. Document Released: 07/15/2002 Document Revised: 10/03/2016 Document Reviewed: 10/03/2016 Elsevier Interactive Patient Education  2019 [employer].

## 2018-09-20 NOTE — Progress Notes (Signed)
Made any corrections needed, and agree with history, physical, neuro exam,assessment and plan as stated.     Avyn Coate, MD [facility]  

## 2018-09-20 NOTE — Progress Notes (Signed)
PATIENT: Thomas Guerrero DOB: 02-20-1946  REASON FOR VISIT: follow up HISTORY FROM: patient  Chief Complaint  Patient presents with  . Follow-up    Yearly follow up. Alone. New room. He stated that he is more prone to dropping stuff since he is having some numbness in hands and fingers.      HISTORY OF PRESENT ILLNESS: Today 09/20/18 Thomas Guerrero is a 73 y.o. male here today for follow up.  He reports that he is doing very well.  He was initially started on gabapentin 300 mg 3 times a day but could not tolerate this dose.  Gabapentin was changed to 100 mg 3 times a day.  He reports that he has been on this for quite some time.  He had ran out of his medication recently and went back to taking 300 mg twice a day.  He did feel noted benefit with the increase in dose.  He does continue to have numbness of both fingers and toes.  He is having a little bit more difficulty holding onto things.  He denies any falls.  He is not interested in physical therapy at this time.  He denies weakness.  HISTORY: (copied from Dr Cathren Laine note on 10/05/2016) HPI:  Thomas Guerrero is a 73 y.o. male here as a referral from Dr. Laurann Montana for foot pain. PMHx gout, radiation for prostate cancer, CKD.  He had radiation for his prostate cancer. That's when he started with problems in the feet. He was being treated for gout. Gout helped the joint but not the feet. He saw Dr. Trudie Reed in rheumatology. He was having pain in the back. She sent him to dr. Nelva Bush for his back pain. He was losing his sense of balance. He fell one time but the shots in the back have significantly helped. The inside of his lower left leg feels wet. The pain in the feet are burning. Especially across the toes on the left foot and in the arch. It hurts on the dorsum of the left foot.The whole foot feels bad in the toes and up to the arch. He can;t wear tight shoes because it hurts. Pain in the feet ois worse at night. His feet get very cold. Hands get  cold and fingers cramp. He puts a neuropathy cream on at night. Worse with standing. Better with walking. The cream on the feet help a lot.  The whole foot just started hurting, but when he thinks about it he has had pain on the top of his feet for even longer maybe years. He wore boots for a long time. He worse his shoes tight.  REVIEW OF SYSTEMS: Out of a complete 14 system review of symptoms, the patient complains only of the following symptoms, fatigue, unexpected weight change, cold intolerance, frequent waking, snoring, joint pain, back pain, muscle cramps, walking difficulty, moles, itching, dizziness, numbness and all other reviewed systems are negative.  ALLERGIES: No Known Allergies  HOME MEDICATIONS: Outpatient Medications Prior to Visit  Medication Sig Dispense Refill  . allopurinol (ZYLOPRIM) 300 MG tablet Take 1 tablet by mouth daily.    Marland Kitchen atorvastatin (LIPITOR) 10 MG tablet Take 10 mg by mouth daily.  11  . cetirizine (ZYRTEC) 10 MG tablet Take 10 mg by mouth daily.    . cholecalciferol (VITAMIN D) 1000 units tablet Take 1,000 Units by mouth daily.    . clotrimazole-betamethasone (LOTRISONE) cream APPLY A THIN LAYER TO THE AFFECTED AREA 2 TIMES DAILY FOR 2 WEEKS  0  . famotidine (PEPCID) 10 MG tablet Take 10 mg by mouth 2 (two) times daily. Reported on 09/18/2015    . Misc Natural Products (TART CHERRY ADVANCED PO) Take by mouth. Reported on 01/08/2016    . triamcinolone cream (KENALOG) 0.1 % Apply 1 application topically 2 (two) times daily as needed.    . gabapentin (NEURONTIN) 100 MG capsule TAKE ONE CAPSULE BY MOUTH 3 TIMES A DAY 90 capsule 0  . benazepril (LOTENSIN) 20 MG tablet   1  . colchicine 0.6 MG tablet TAKE 1 TABLET BY MOUTH EVERY 6 HOURS AS NEEDED FOR PAIN UNTIL GI SIDE EFFECTS.  1  . HYDROcodone-acetaminophen (NORCO/VICODIN) 5-325 MG tablet Take 1 tablet by mouth 2 (two) times daily.    . traMADol (ULTRAM) 50 MG tablet TAKE 1 TABLET BY MOUTH AT BEDTIME AS NEEDED FOR  PAIN IN FEET  0   No facility-administered medications prior to visit.     PAST MEDICAL HISTORY: Past Medical History:  Diagnosis Date  . Chronic kidney disease   . GERD (gastroesophageal reflux disease)   . High cholesterol   . Hx of gout   . Hypertension   . Prostate cancer ([place])     PAST SURGICAL HISTORY: Past Surgical History:  Procedure Laterality Date  . Biopsy of the Prostate      FAMILY HISTORY: Family History  Problem Relation Age of Onset  . Cancer Son        leukemia  . Neuropathy Neg Hx     SOCIAL HISTORY: Social History   Socioeconomic History  . Marital status: Married    Spouse name: Not on file  . Number of children: 2  . Years of education: Some college  . Highest education level: Not on file  Occupational History  . Occupation: Retired  Scientific laboratory technician  . Financial resource strain: Not on file  . Food insecurity:    Worry: Not on file    Inability: Not on file  . Transportation needs:    Medical: Not on file    Non-medical: Not on file  Tobacco Use  . Smoking status: Former Smoker    Years: 25.00    Types: Cigarettes    Start date: 08/08/1996  . Smokeless tobacco: Never Used  Substance and Sexual Activity  . Alcohol use: Yes    Alcohol/week: 0.0 standard drinks  . Drug use: No  . Sexual activity: Yes  Lifestyle  . Physical activity:    Days per week: Not on file    Minutes per session: Not on file  . Stress: Not on file  Relationships  . Social connections:    Talks on phone: Not on file    Gets together: Not on file    Attends religious service: Not on file    Active member of club or organization: Not on file    Attends meetings of clubs or organizations: Not on file    Relationship status: Not on file  . Intimate partner violence:    Fear of current or ex partner: Not on file    Emotionally abused: Not on file    Physically abused: Not on file    Forced sexual activity: Not on file  Other Topics Concern  . Not on file    Social History Narrative   Lives at home w/ his wife and daughter   Right-handed   Caffeine: 1 soda per day      PHYSICAL EXAM  Vitals:   09/20/18 1057  BP: (!) 166/81  Pulse: 77  Weight: 208 lb 12.8 oz (94.7 kg)  Height: $Remove'5\' 9"'FciyuEN$  (1.753 m)   Body mass index is 30.83 kg/m.  Generalized: Well developed, in no acute distress  Cardiology: normal rate and rhythm, no murmur noted Neurological examination  Mentation: Alert oriented to time, place, history taking. Follows all commands speech and language fluent Cranial nerve II-XII: Pupils were equal round reactive to light. Extraocular movements were full, visual field were full on confrontational test. Facial sensation and strength were normal. Uvula tongue midline. Head turning and shoulder shrug  were normal and symmetric. Motor: The motor testing reveals 5 over 5 strength of all 4 extremities. Good symmetric motor tone is noted throughout.  Sensory: Sensory testing is intact to soft touch on all 4 extremities. No evidence of extinction is noted.  Coordination: Cerebellar testing reveals good finger-nose-finger and heel-to-shin bilaterally.  Gait and station: Gait is normal. Tandem gait is normal. Romberg is negative. No drift is seen.  Reflexes: Deep tendon reflexes are symmetric and normal bilaterally.   DIAGNOSTIC DATA (LABS, IMAGING, TESTING) - I reviewed patient records, labs, notes, testing and imaging myself where available.  No flowsheet data found.   Lab Results  Component Value Date   WBC 4.7 08/25/2017   HGB 10.2 (L) 08/25/2017   HCT 31.0 (L) 08/25/2017   MCV 99.9 (H) 08/25/2017   PLT 139 (L) 08/25/2017      Component Value Date/Time   NA 140 08/25/2017 0925   K 4.5 08/25/2017 0925   CL 108 08/25/2017 0925   CO2 22 08/25/2017 0925   GLUCOSE 112 08/25/2017 0925   BUN 37 (H) 08/25/2017 0925   CREATININE 2.92 (H) 08/25/2017 0925   CALCIUM 10.8 (H) 08/25/2017 0925   PROT 7.1 08/25/2017 0925   PROT 6.4  11/24/2016 1304   ALBUMIN 3.9 08/25/2017 0925   AST 21 08/25/2017 0925   ALT 18 08/25/2017 0925   ALKPHOS 74 08/25/2017 0925   BILITOT 0.5 08/25/2017 0925   GFRNONAA 20 (L) 08/25/2017 0925   GFRAA 23 (L) 08/25/2017 0925   No results found for: CHOL, HDL, LDLCALC, LDLDIRECT, TRIG, CHOLHDL No results found for: HGBA1C Lab Results  Component Value Date   VITAMINB12 823 11/24/2016   No results found for: TSH   ASSESSMENT AND PLAN 73 y.o. year old male  has a past medical history of Chronic kidney disease, GERD (gastroesophageal reflux disease), High cholesterol, gout, Hypertension, and Prostate cancer ([place]). here with     ICD-10-CM   1. 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 Levesque is doing well overall.  I do feel that he would benefit from an increased dose and gabapentin.  He seems to have tolerated this well recently.  We will prescribe gabapentin 300 mg 3 times a day however he is aware that he can take this twice daily as needed.  I have advised that he stay active and manage other chronic conditions.  I have suggested that he consider physical therapy as needed in the future.  He is aware of this recommendation and will give it some thought.  He will follow-up with me in 1 year.   No orders of the defined types were placed in this encounter.    Meds ordered this encounter  Medications  . gabapentin (NEURONTIN) 300 MG capsule    Sig: Take 1 capsule (300 mg total) by mouth 3 (three) times daily.    Dispense:  90 capsule    Refill:  11    Order Specific Question:   Supervising Provider    Answer:   Melvenia Beam [id number]      I spent 15 minutes with the patient. 50% of this time was spent counseling and educating patient on plan of care and medications.    Debbora Presto, FNP-C 09/20/2018, 2:21 PM [facility] [address], [place], [place] [address] [phone]

## 2018-09-27 ENCOUNTER — Other Ambulatory Visit: Payer: Self-pay | Admitting: Neurology

## 2018-10-17 DIAGNOSIS — N184 Chronic kidney disease, stage 4 (severe): Secondary | ICD-10-CM | POA: Diagnosis not present

## 2018-10-18 DIAGNOSIS — N184 Chronic kidney disease, stage 4 (severe): Secondary | ICD-10-CM | POA: Diagnosis not present

## 2018-10-18 DIAGNOSIS — G629 Polyneuropathy, unspecified: Secondary | ICD-10-CM | POA: Diagnosis not present

## 2018-10-18 DIAGNOSIS — Z1389 Encounter for screening for other disorder: Secondary | ICD-10-CM | POA: Diagnosis not present

## 2018-10-18 DIAGNOSIS — C61 Malignant neoplasm of prostate: Secondary | ICD-10-CM | POA: Diagnosis not present

## 2018-10-18 DIAGNOSIS — M109 Gout, unspecified: Secondary | ICD-10-CM | POA: Diagnosis not present

## 2018-10-18 DIAGNOSIS — I129 Hypertensive chronic kidney disease with stage 1 through stage 4 chronic kidney disease, or unspecified chronic kidney disease: Secondary | ICD-10-CM | POA: Diagnosis not present

## 2018-10-18 DIAGNOSIS — E7800 Pure hypercholesterolemia, unspecified: Secondary | ICD-10-CM | POA: Diagnosis not present

## 2018-10-18 DIAGNOSIS — Z0000 Encounter for general adult medical examination without abnormal findings: Secondary | ICD-10-CM | POA: Diagnosis not present

## 2018-10-23 DIAGNOSIS — E669 Obesity, unspecified: Secondary | ICD-10-CM | POA: Diagnosis not present

## 2018-10-23 DIAGNOSIS — I129 Hypertensive chronic kidney disease with stage 1 through stage 4 chronic kidney disease, or unspecified chronic kidney disease: Secondary | ICD-10-CM | POA: Diagnosis not present

## 2018-10-23 DIAGNOSIS — D631 Anemia in chronic kidney disease: Secondary | ICD-10-CM | POA: Diagnosis not present

## 2018-10-23 DIAGNOSIS — N184 Chronic kidney disease, stage 4 (severe): Secondary | ICD-10-CM | POA: Diagnosis not present

## 2018-10-23 DIAGNOSIS — D472 Monoclonal gammopathy: Secondary | ICD-10-CM | POA: Diagnosis not present

## 2018-11-13 DIAGNOSIS — C61 Malignant neoplasm of prostate: Secondary | ICD-10-CM | POA: Diagnosis not present

## 2018-11-23 DIAGNOSIS — C61 Malignant neoplasm of prostate: Secondary | ICD-10-CM | POA: Diagnosis not present

## 2018-11-23 DIAGNOSIS — N5201 Erectile dysfunction due to arterial insufficiency: Secondary | ICD-10-CM | POA: Diagnosis not present

## 2018-11-23 DIAGNOSIS — N481 Balanitis: Secondary | ICD-10-CM | POA: Diagnosis not present

## 2018-12-18 ENCOUNTER — Telehealth: Payer: Self-pay

## 2018-12-18 MED ORDER — GABAPENTIN 300 MG PO CAPS: 300.0000 mg | ORAL_CAPSULE | Freq: Three times a day (TID) | ORAL | 1 refills | Status: DC

## 2018-12-18 NOTE — Telephone Encounter (Signed)
Received paperwork from [facility] that the patient is requesting a 90 day supply on his gabapentin (NEURONTIN) 300 MG capsule. Please advise.

## 2018-12-18 NOTE — Telephone Encounter (Signed)
Medication has been sent to the patient's pharmacy on file.

## 2018-12-18 NOTE — Addendum Note (Signed)
Addended by: Thomes Cake on: 12/18/2018 09:29 AM   Modules accepted: Orders

## 2018-12-18 NOTE — Telephone Encounter (Signed)
Ok to fill 

## 2019-01-22 DIAGNOSIS — C4491 Basal cell carcinoma of skin, unspecified: Secondary | ICD-10-CM | POA: Diagnosis not present

## 2019-01-22 DIAGNOSIS — C44519 Basal cell carcinoma of skin of other part of trunk: Secondary | ICD-10-CM | POA: Diagnosis not present

## 2019-01-22 DIAGNOSIS — D485 Neoplasm of uncertain behavior of skin: Secondary | ICD-10-CM | POA: Diagnosis not present

## 2019-01-22 DIAGNOSIS — L570 Actinic keratosis: Secondary | ICD-10-CM | POA: Diagnosis not present

## 2019-03-14 DIAGNOSIS — C44319 Basal cell carcinoma of skin of other parts of face: Secondary | ICD-10-CM | POA: Diagnosis not present

## 2019-04-09 HISTORY — PX: MOHS SURGERY: SUR867

## 2019-04-22 DIAGNOSIS — N184 Chronic kidney disease, stage 4 (severe): Secondary | ICD-10-CM | POA: Diagnosis not present

## 2019-04-24 DIAGNOSIS — L570 Actinic keratosis: Secondary | ICD-10-CM | POA: Diagnosis not present

## 2019-04-24 DIAGNOSIS — L820 Inflamed seborrheic keratosis: Secondary | ICD-10-CM | POA: Diagnosis not present

## 2019-04-25 DIAGNOSIS — E7800 Pure hypercholesterolemia, unspecified: Secondary | ICD-10-CM | POA: Diagnosis not present

## 2019-04-25 DIAGNOSIS — N184 Chronic kidney disease, stage 4 (severe): Secondary | ICD-10-CM | POA: Diagnosis not present

## 2019-04-25 DIAGNOSIS — C61 Malignant neoplasm of prostate: Secondary | ICD-10-CM | POA: Diagnosis not present

## 2019-04-25 DIAGNOSIS — I129 Hypertensive chronic kidney disease with stage 1 through stage 4 chronic kidney disease, or unspecified chronic kidney disease: Secondary | ICD-10-CM | POA: Diagnosis not present

## 2019-05-01 DIAGNOSIS — E7800 Pure hypercholesterolemia, unspecified: Secondary | ICD-10-CM | POA: Diagnosis not present

## 2019-05-01 DIAGNOSIS — Z23 Encounter for immunization: Secondary | ICD-10-CM | POA: Diagnosis not present

## 2019-05-07 DIAGNOSIS — E559 Vitamin D deficiency, unspecified: Secondary | ICD-10-CM | POA: Diagnosis not present

## 2019-05-07 DIAGNOSIS — Z1159 Encounter for screening for other viral diseases: Secondary | ICD-10-CM | POA: Diagnosis not present

## 2019-05-07 DIAGNOSIS — N184 Chronic kidney disease, stage 4 (severe): Secondary | ICD-10-CM | POA: Diagnosis not present

## 2019-05-07 DIAGNOSIS — C61 Malignant neoplasm of prostate: Secondary | ICD-10-CM | POA: Diagnosis not present

## 2019-05-07 DIAGNOSIS — M109 Gout, unspecified: Secondary | ICD-10-CM | POA: Diagnosis not present

## 2019-05-07 DIAGNOSIS — I129 Hypertensive chronic kidney disease with stage 1 through stage 4 chronic kidney disease, or unspecified chronic kidney disease: Secondary | ICD-10-CM | POA: Diagnosis not present

## 2019-05-07 DIAGNOSIS — D472 Monoclonal gammopathy: Secondary | ICD-10-CM | POA: Diagnosis not present

## 2019-05-22 DIAGNOSIS — N481 Balanitis: Secondary | ICD-10-CM | POA: Diagnosis not present

## 2019-05-22 DIAGNOSIS — C61 Malignant neoplasm of prostate: Secondary | ICD-10-CM | POA: Diagnosis not present

## 2019-06-23 ENCOUNTER — Other Ambulatory Visit: Payer: Self-pay | Admitting: Neurology

## 2019-09-23 ENCOUNTER — Ambulatory Visit: Payer: Medicare Other | Admitting: Family Medicine

## 2019-09-23 DIAGNOSIS — L821 Other seborrheic keratosis: Secondary | ICD-10-CM | POA: Diagnosis not present

## 2019-09-23 DIAGNOSIS — L578 Other skin changes due to chronic exposure to nonionizing radiation: Secondary | ICD-10-CM | POA: Diagnosis not present

## 2019-09-23 DIAGNOSIS — L570 Actinic keratosis: Secondary | ICD-10-CM | POA: Diagnosis not present

## 2019-09-23 DIAGNOSIS — Z23 Encounter for immunization: Secondary | ICD-10-CM | POA: Diagnosis not present

## 2019-09-23 DIAGNOSIS — D1801 Hemangioma of skin and subcutaneous tissue: Secondary | ICD-10-CM | POA: Diagnosis not present

## 2019-09-23 DIAGNOSIS — D225 Melanocytic nevi of trunk: Secondary | ICD-10-CM | POA: Diagnosis not present

## 2019-09-23 DIAGNOSIS — D485 Neoplasm of uncertain behavior of skin: Secondary | ICD-10-CM | POA: Diagnosis not present

## 2019-09-23 DIAGNOSIS — Z85828 Personal history of other malignant neoplasm of skin: Secondary | ICD-10-CM | POA: Diagnosis not present

## 2019-09-30 ENCOUNTER — Ambulatory Visit (INDEPENDENT_AMBULATORY_CARE_PROVIDER_SITE_OTHER): Payer: Medicare Other | Admitting: Family Medicine

## 2019-09-30 ENCOUNTER — Encounter: Payer: Self-pay | Admitting: Family Medicine

## 2019-09-30 ENCOUNTER — Other Ambulatory Visit: Payer: Self-pay

## 2019-09-30 VITALS — BP 132/74 | HR 84 | Temp 97.2°F | Ht 69.0 in | Wt 211.6 lb

## 2019-09-30 DIAGNOSIS — G629 Polyneuropathy, unspecified: Secondary | ICD-10-CM

## 2019-09-30 MED ORDER — GABAPENTIN 300 MG PO CAPS: 300.0000 mg | ORAL_CAPSULE | Freq: Two times a day (BID) | ORAL | 3 refills | Status: DC

## 2019-09-30 NOTE — Progress Notes (Addendum)
PATIENT: Thomas Guerrero DOB: 1945-09-25  REASON FOR VISIT: follow up HISTORY FROM: patient  Chief Complaint  Patient presents with  . Follow-up    Yearly f/u. Alone. New room. No new concerns at thist time.      HISTORY OF PRESENT ILLNESS: Today 10/01/19 [name] is a 74 y.o. male here today for follow up for polyneuropathy. He continues gabapentin 300mg  twice daily. He feels that neuropathy pain is about the same. No alleviating or aggravating factors. He has started using Megalife Diabetic Cream as well that seems to provide relief. He continues to work part time and tries to stay very active. No falls or changes in gait.   HISTORY: (copied from my note on 09/20/2018)  [name] is a 74 y.o. male here today for follow up.  He reports that he is doing very well.  He was initially started on gabapentin 300 mg 3 times a day but could not tolerate this dose.  Gabapentin was changed to 100 mg 3 times a day.  He reports that he has been on this for quite some time.  He had ran out of his medication recently and went back to taking 300 mg twice a day.  He did feel noted benefit with the increase in dose.  He does continue to have numbness of both fingers and toes.  He is having a little bit more difficulty holding onto things.  He denies any falls.  He is not interested in physical therapy at this time.  He denies weakness.  HISTORY: (copied from Dr Cathren Laine note on 10/05/2016)  JSE:GBTDVVO Stanleyis a 74 y.o.malehere as a referral from Dr. Arnetha Gula foot pain.PMHx gout, radiation for prostate cancer, CKD.He had radiation for his prostate cancer. That's when he started with problems in the feet. He was being treated for gout. Gout helped the joint but not the feet. He saw Dr. Trudie Reed in rheumatology. He was having pain in the back. She sent him to dr. Nelva Bush for his back pain. He was losing his sense of balance. He fell one time but the shots in the back have significantly  helped. The inside of his lower left leg feels wet. The pain in the feet are burning. Especially across the toes on the left foot and in the arch. It hurts on the dorsum of the left foot.The whole foot feels bad in the toes and up to the arch. He can;t wear tight shoes because it hurts. Pain in the feet ois worse at night. His feet get very cold. Hands get cold and fingers cramp. He puts a neuropathy cream on at night. Worse with standing. Better with walking. The cream on the feet help a lot. The whole foot just started hurting, but when he thinks about it he has had pain on the top of his feet for even longer maybe years. He wore boots for a long time. He worse his shoes tight.   REVIEW OF SYSTEMS: Out of a complete 14 system review of symptoms, the patient complains only of the following symptoms, numbness, tingling and all other reviewed systems are negative.  ALLERGIES: No Known Allergies  HOME MEDICATIONS: Outpatient Medications Prior to Visit  Medication Sig Dispense Refill  . allopurinol (ZYLOPRIM) 100 MG tablet Take 100 mg by mouth daily.    Marland Kitchen atorvastatin (LIPITOR) 10 MG tablet Take 10 mg by mouth daily.  11  . cholecalciferol (VITAMIN D) 1000 units tablet Take 1,000 Units by mouth daily.    Marland Kitchen  colchicine 0.6 MG tablet TAKE 1 TABLET BY MOUTH EVERY 6 HOURS AS NEEDED FOR PAIN UNTIL GI SIDE EFFECTS.  1  . famotidine (PEPCID) 10 MG tablet Take 10 mg by mouth 2 (two) times daily. Reported on 09/18/2015    . ferrous sulfate 325 (65 FE) MG tablet Take 325 mg by mouth daily with breakfast.    . lisinopril (ZESTRIL) 10 MG tablet Take 10 mg by mouth daily.    . Misc Natural Products (TART CHERRY ADVANCED PO) Take by mouth. Reported on 01/08/2016    . triamcinolone cream (KENALOG) 0.1 % Apply 1 application topically 2 (two) times daily as needed.    . gabapentin (NEURONTIN) 300 MG capsule TAKE 1 CAPSULE BY MOUTH THREE TIMES A DAY 270 capsule 1  . allopurinol (ZYLOPRIM) 300 MG tablet Take 1 tablet by  mouth daily.    . benazepril (LOTENSIN) 20 MG tablet   1  . cetirizine (ZYRTEC) 10 MG tablet Take 10 mg by mouth daily.    . clotrimazole-betamethasone (LOTRISONE) cream APPLY A THIN LAYER TO THE AFFECTED AREA 2 TIMES DAILY FOR 2 WEEKS  0  . HYDROcodone-acetaminophen (NORCO/VICODIN) 5-325 MG tablet Take 1 tablet by mouth 2 (two) times daily.    . traMADol (ULTRAM) 50 MG tablet TAKE 1 TABLET BY MOUTH AT BEDTIME AS NEEDED FOR PAIN IN FEET  0   No facility-administered medications prior to visit.    PAST MEDICAL HISTORY: Past Medical History:  Diagnosis Date  . Chronic kidney disease   . GERD (gastroesophageal reflux disease)   . High cholesterol   . Hx of gout   . Hypertension   . Prostate cancer ([place])     PAST SURGICAL HISTORY: Past Surgical History:  Procedure Laterality Date  . Biopsy of the Prostate      FAMILY HISTORY: Family History  Problem Relation Age of Onset  . Cancer Son        leukemia  . Neuropathy Neg Hx     SOCIAL HISTORY: Social History   Socioeconomic History  . Marital status: Married    Spouse name: Not on file  . Number of children: 2  . Years of education: Some college  . Highest education level: Not on file  Occupational History  . Occupation: Retired  Tobacco Use  . Smoking status: Former Smoker    Years: 25.00    Types: Cigarettes    Start date: 08/08/1996  . Smokeless tobacco: Never Used  Substance and Sexual Activity  . Alcohol use: Yes    Alcohol/week: 0.0 standard drinks  . Drug use: No  . Sexual activity: Yes  Other Topics Concern  . Not on file  Social History Narrative   Lives at home w/ his wife and daughter   Right-handed   Caffeine: 1 soda per day   Social Determinants of Health   Financial Resource Strain:   . Difficulty of Paying Living Expenses: Not on file  Food Insecurity:   . Worried About Charity fundraiser in the Last Year: Not on file  . Ran Out of Food in the Last Year: Not on file  Transportation Needs:    . Lack of Transportation (Medical): Not on file  . Lack of Transportation (Non-Medical): Not on file  Physical Activity:   . Days of Exercise per Week: Not on file  . Minutes of Exercise per Session: Not on file  Stress:   . Feeling of Stress : Not on file  Social Connections:   .  Frequency of Communication with Friends and Family: Not on file  . Frequency of Social Gatherings with Friends and Family: Not on file  . Attends Religious Services: Not on file  . Active Member of Clubs or Organizations: Not on file  . Attends Archivist Meetings: Not on file  . Marital Status: Not on file  Intimate Partner Violence:   . Fear of Current or Ex-Partner: Not on file  . Emotionally Abused: Not on file  . Physically Abused: Not on file  . Sexually Abused: Not on file      PHYSICAL EXAM  Vitals:   09/30/19 1443  BP: 132/74  Pulse: 84  Temp: (!) 97.2 F (36.2 C)  TempSrc: Oral  Weight: 211 lb 9.6 oz (96 kg)  Height: $Remove'5\' 9"'ZHmgONY$  (1.753 m)   Body mass index is 31.25 kg/m.  Generalized: Well developed, in no acute distress  Cardiology: normal rate and rhythm, no murmur noted Respiratory: clear to auscultation bilaterally  Neurological examination  Mentation: Alert oriented to time, place, history taking. Follows all commands speech and language fluent Cranial nerve II-XII: Pupils were equal round reactive to light. Extraocular movements were full, visual field were full  Motor: The motor testing reveals 5 over 5 strength of all 4 extremities. Good symmetric motor tone is noted throughout.  Sensory: Sensory testing is intact to soft touch on all 4 extremities with exception of mildly decreased sensation of left dorsal foot. No evidence of extinction is noted.   Gait and station: Gait is normal.   DIAGNOSTIC DATA (LABS, IMAGING, TESTING) - I reviewed patient records, labs, notes, testing and imaging myself where available.  No flowsheet data found.   Lab Results  Component  Value Date   WBC 4.7 08/25/2017   HGB 10.2 (L) 08/25/2017   HCT 31.0 (L) 08/25/2017   MCV 99.9 (H) 08/25/2017   PLT 139 (L) 08/25/2017      Component Value Date/Time   NA 140 08/25/2017 0925   K 4.5 08/25/2017 0925   CL 108 08/25/2017 0925   CO2 22 08/25/2017 0925   GLUCOSE 112 08/25/2017 0925   BUN 37 (H) 08/25/2017 0925   CREATININE 2.92 (H) 08/25/2017 0925   CALCIUM 10.8 (H) 08/25/2017 0925   PROT 7.1 08/25/2017 0925   PROT 6.4 11/24/2016 1304   ALBUMIN 3.9 08/25/2017 0925   AST 21 08/25/2017 0925   ALT 18 08/25/2017 0925   ALKPHOS 74 08/25/2017 0925   BILITOT 0.5 08/25/2017 0925   GFRNONAA 20 (L) 08/25/2017 0925   GFRAA 23 (L) 08/25/2017 0925   No results found for: CHOL, HDL, LDLCALC, LDLDIRECT, TRIG, CHOLHDL No results found for: HGBA1C Lab Results  Component Value Date   VITAMINB12 823 11/24/2016   No results found for: TSH     ASSESSMENT AND PLAN 74 y.o. year old male  has a past medical history of Chronic kidney disease, GERD (gastroesophageal reflux disease), High cholesterol, gout, Hypertension, and Prostate cancer ([place]). here with     ICD-10-CM   1. Polyneuropathy  G62.9     Mr Bostwick is doing well on gabapentin $RemoveBefor'300mg'zJSojcCNbWjv$  twice daily. We will continue current therapy. He also noted benefit with Megalife Diabetic Foot Cream. He was encouraged to stay active. Eat a healthy, well balanced diet and drink plenty of water. He will follow up with me in 1 year, sooner if needed. He verbalizes understanding and agreement with this plan.    No orders of the defined types were placed in  this encounter.    Meds ordered this encounter  Medications  . gabapentin (NEURONTIN) 300 MG capsule    Sig: Take 1 capsule (300 mg total) by mouth 2 (two) times daily.    Dispense:  180 capsule    Refill:  3    Order Specific Question:   Supervising Provider    Answer:   Melvenia Beam [id number]      I spent 15 minutes with the patient. 50% of this time was spent  counseling and educating patient on plan of care and medications.    Debbora Presto, FNP-C 10/01/2019, 9:46 AM [facility] [address] [place], [place] [address] [phone]  Made any corrections needed, and agree with history, physical, neuro exam,assessment and plan as stated.     Sarina Ill, MD [facility]

## 2019-09-30 NOTE — Patient Instructions (Signed)
Continue gabapentin 300mg  twice daily as prescribed.  Continue [place] Diabetic Cream   Stay well hydrated and be as active as possible   Follow up in 1 year, sooner if needed   Peripheral Neuropathy Peripheral neuropathy is a type of nerve damage. It affects nerves that carry signals between the spinal cord and the arms, legs, and the rest of the body (peripheral nerves). It does not affect nerves in the spinal cord or brain. In peripheral neuropathy, one nerve or a group of nerves may be damaged. Peripheral neuropathy is a broad category that includes many specific nerve disorders, like diabetic neuropathy, hereditary neuropathy, and carpal tunnel syndrome. What are the causes? This condition may be caused by:  Diabetes. This is the most common cause of peripheral neuropathy.  Nerve injury.  Pressure or stress on a nerve that lasts a long time.  Lack (deficiency) of B vitamins. This can result from alcoholism, poor diet, or a restricted diet.  Infections.  Autoimmune diseases, such as rheumatoid arthritis and systemic lupus erythematosus.  Nerve diseases that are passed from parent to child (inherited).  Some medicines, such as cancer medicines (chemotherapy).  Poisonous (toxic) substances, such as lead and mercury.  Too little blood flowing to the legs.  Kidney disease.  Thyroid disease. In some cases, the cause of this condition is not known. What are the signs or symptoms? Symptoms of this condition depend on which of your nerves is damaged. Common symptoms include:  Loss of feeling (numbness) in the feet, hands, or both.  Tingling in the feet, hands, or both.  Burning pain.  Very sensitive skin.  Weakness.  Not being able to move a part of the body (paralysis).  Muscle twitching.  Clumsiness or poor coordination.  Loss of balance.  Not being able to control your bladder.  Feeling dizzy.  Sexual problems. How is this diagnosed? Diagnosing and  finding the cause of peripheral neuropathy can be difficult. Your health care provider will take your medical history and do a physical exam. A neurological exam will also be done. This involves checking things that are affected by your brain, spinal cord, and nerves (nervous system). For example, your health care provider will check your reflexes, how you move, and what you can feel. You may have other tests, such as:  Blood tests.  Electromyogram (EMG) and nerve conduction tests. These tests check nerve function and how well the nerves are controlling the muscles.  Imaging tests, such as CT scans or MRI to rule out other causes of your symptoms.  Removing a small piece of nerve to be examined in a lab (nerve biopsy). This is rare.  Removing and examining a small amount of the fluid that surrounds the brain and spinal cord (lumbar puncture). This is rare. How is this treated? Treatment for this condition may involve:  Treating the underlying cause of the neuropathy, such as diabetes, kidney disease, or vitamin deficiencies.  Stopping medicines that can cause neuropathy, such as chemotherapy.  Medicine to relieve pain. Medicines may include: ? Prescription or over-the-counter pain medicine. ? Antiseizure medicine. ? Antidepressants. ? Pain-relieving patches that are applied to painful areas of skin.  Surgery to relieve pressure on a nerve or to destroy a nerve that is causing pain.  Physical therapy to help improve movement and balance.  Devices to help you move around (assistive devices). Follow these instructions at home: Medicines  Take over-the-counter and prescription medicines only as told by your health care provider. Do not  take any other medicines without first asking your health care provider.  Do not drive or use heavy machinery while taking prescription pain medicine. Lifestyle   Do not use any products that contain nicotine or tobacco, such as cigarettes and  e-cigarettes. Smoking keeps blood from reaching damaged nerves. If you need help quitting, ask your health care provider.  Avoid or limit alcohol. Too much alcohol can cause a vitamin B deficiency, and vitamin B is needed for healthy nerves.  Eat a healthy diet. This includes: ? Eating foods that are high in fiber, such as fresh fruits and vegetables, whole grains, and beans. ? Limiting foods that are high in fat and processed sugars, such as fried or sweet foods. General instructions   If you have diabetes, work closely with your health care provider to keep your blood sugar under control.  If you have numbness in your feet: ? Check every day for signs of injury or infection. Watch for redness, warmth, and swelling. ? Wear padded socks and comfortable shoes. These help protect your feet.  Develop a good support system. Living with peripheral neuropathy can be stressful. Consider talking with a mental health specialist or joining a support group.  Use assistive devices and attend physical therapy as told by your health care provider. This may include using a walker or a cane.  Keep all follow-up visits as told by your health care provider. This is important. Contact a health care provider if:  You have new signs or symptoms of peripheral neuropathy.  You are struggling emotionally from dealing with peripheral neuropathy.  Your pain is not well-controlled. Get help right away if:  You have an injury or infection that is not healing normally.  You develop new weakness in an arm or leg.  You fall frequently. Summary  Peripheral neuropathy is when the nerves in the arms, or legs are damaged, resulting in numbness, weakness, or pain.  There are many causes of peripheral neuropathy, including diabetes, pinched nerves, vitamin deficiencies, autoimmune disease, and hereditary conditions.  Diagnosing and finding the cause of peripheral neuropathy can be difficult. Your health care  provider will take your medical history, do a physical exam, and do tests, including blood tests and nerve function tests.  Treatment involves treating the underlying cause of the neuropathy and taking medicines to help control pain. Physical therapy and assistive devices may also help. This information is not intended to replace advice given to you by your health care provider. Make sure you discuss any questions you have with your health care provider. Document Revised: 07/07/2017 Document Reviewed: 10/03/2016 Elsevier Patient Education  2020 [employer].

## 2019-10-01 ENCOUNTER — Encounter: Payer: Self-pay | Admitting: Family Medicine

## 2019-10-16 DIAGNOSIS — Z23 Encounter for immunization: Secondary | ICD-10-CM | POA: Diagnosis not present

## 2019-10-28 DIAGNOSIS — G629 Polyneuropathy, unspecified: Secondary | ICD-10-CM | POA: Diagnosis not present

## 2019-10-28 DIAGNOSIS — I129 Hypertensive chronic kidney disease with stage 1 through stage 4 chronic kidney disease, or unspecified chronic kidney disease: Secondary | ICD-10-CM | POA: Diagnosis not present

## 2019-10-28 DIAGNOSIS — E7800 Pure hypercholesterolemia, unspecified: Secondary | ICD-10-CM | POA: Diagnosis not present

## 2019-10-28 DIAGNOSIS — Z0000 Encounter for general adult medical examination without abnormal findings: Secondary | ICD-10-CM | POA: Diagnosis not present

## 2019-10-28 DIAGNOSIS — M109 Gout, unspecified: Secondary | ICD-10-CM | POA: Diagnosis not present

## 2019-10-28 DIAGNOSIS — C61 Malignant neoplasm of prostate: Secondary | ICD-10-CM | POA: Diagnosis not present

## 2019-10-28 DIAGNOSIS — Z1389 Encounter for screening for other disorder: Secondary | ICD-10-CM | POA: Diagnosis not present

## 2019-10-28 DIAGNOSIS — N184 Chronic kidney disease, stage 4 (severe): Secondary | ICD-10-CM | POA: Diagnosis not present

## 2019-11-11 DIAGNOSIS — Z23 Encounter for immunization: Secondary | ICD-10-CM | POA: Diagnosis not present

## 2019-11-20 DIAGNOSIS — C61 Malignant neoplasm of prostate: Secondary | ICD-10-CM | POA: Diagnosis not present

## 2019-11-20 DIAGNOSIS — N481 Balanitis: Secondary | ICD-10-CM | POA: Diagnosis not present

## 2019-12-05 IMAGING — MR MR ABDOMEN W/O CM
7 of 9 series · 35 of 48 positions shown · non-contrast
Comparison: No priors.

CLINICAL DATA: 72-year-old male with history of prostate cancer
diagnosed in 7473. Indeterminate lesion in the lateral aspect of the
left kidney. Followup study.

EXAM:
MRI ABDOMEN WITHOUT CONTRAST
TECHNIQUE: Multiplanar multisequence MR imaging was performed without the
administration of intravenous contrast.

[Series 9: T2 · axial · 5.0mm · 0.78mm/px · z∈[-196,+32]mm · 4 of 39 slices shown (1 of 3)]
[im 1/39]
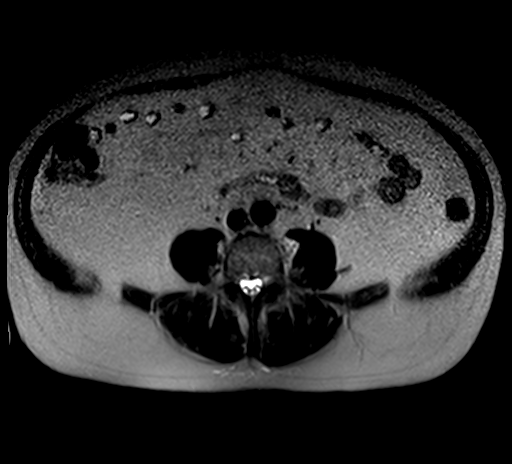
[im 13/39]
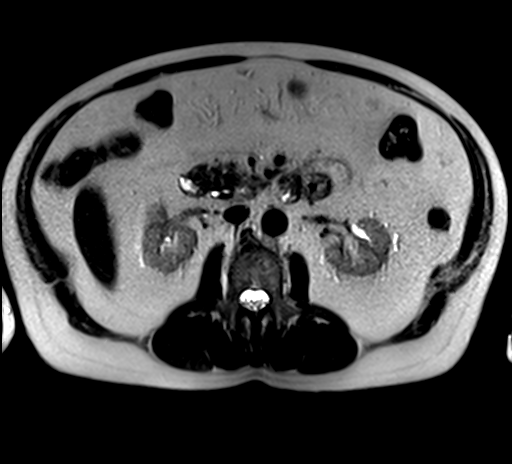
[im 26/39]
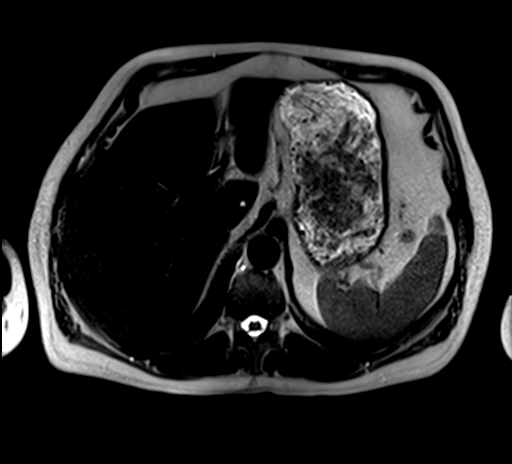
[im 39/39]
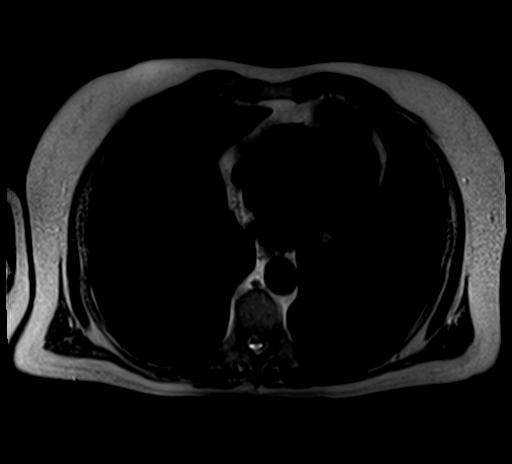

[Series 10: T2 · axial · 5.0mm · 1.56mm/px · z∈[-192,+42]mm · 5 of 40 slices shown (2 of 3)]
[im 1/40]
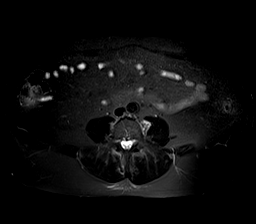
[im 10/40]
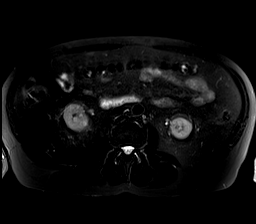
[im 20/40]
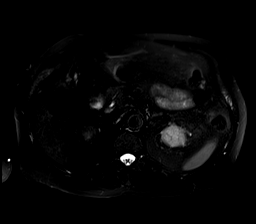
[im 30/40]
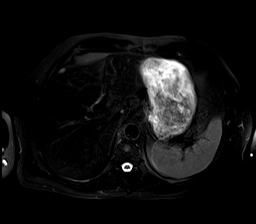
[im 40/40]
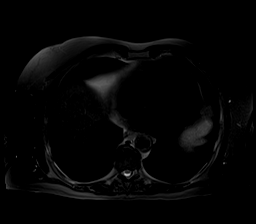

[Series 11: [id number] · axial · 6.0mm · 2.08mm/px · z∈[-184,+81]mm · 9 of 103 slices shown]
[im 1/103]
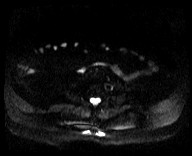
[im 19/103]
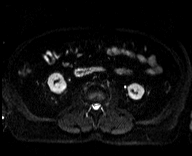
[im 28/103]
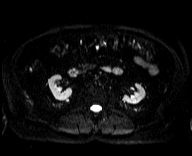
[im 47/103]
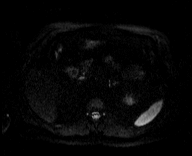
[im 56/103]
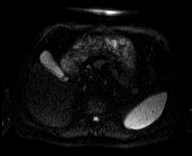
[im 75/103]
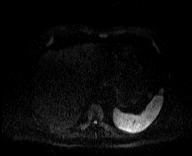
[im 84/103]
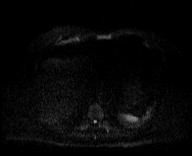
[im 93/103]
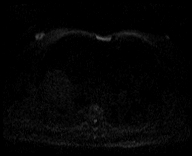
[im 103/103]
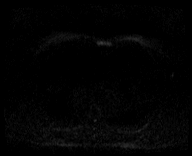

[Series 12: [id number] · axial · 6.0mm · 2.08mm/px · z∈[-184,+81]mm · 4 of 35 slices shown]
[im 1/35]
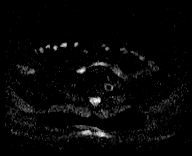
[im 12/35]
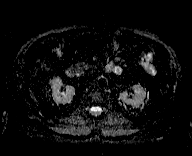
[im 23/35]
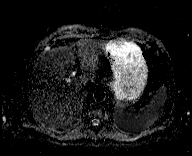
[im 35/35]
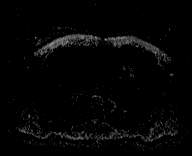

[Series 13: T2 · coronal · 5.0mm · 0.78mm/px · 5 of 40 slices shown (3 of 3)]
[im 1/40]
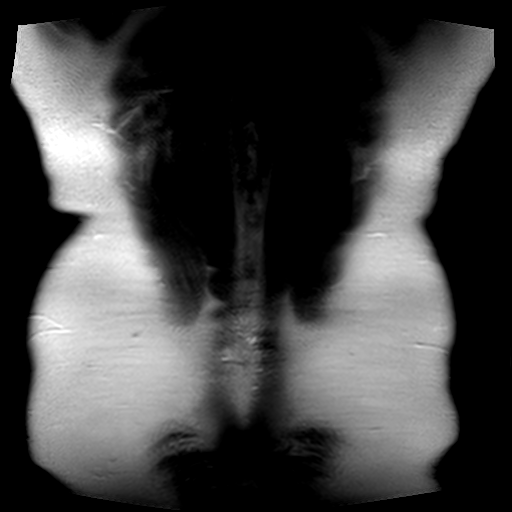
[im 10/40]
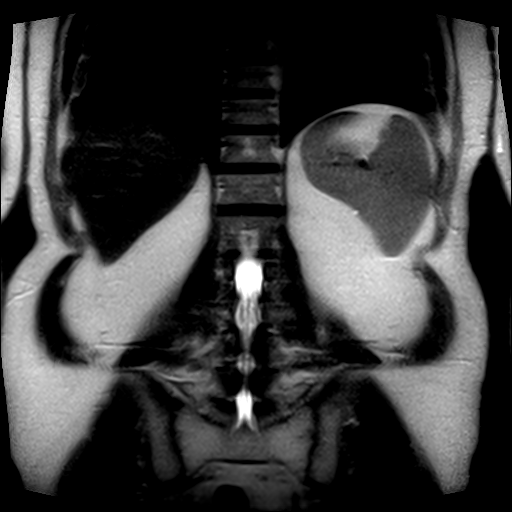
[im 20/40]
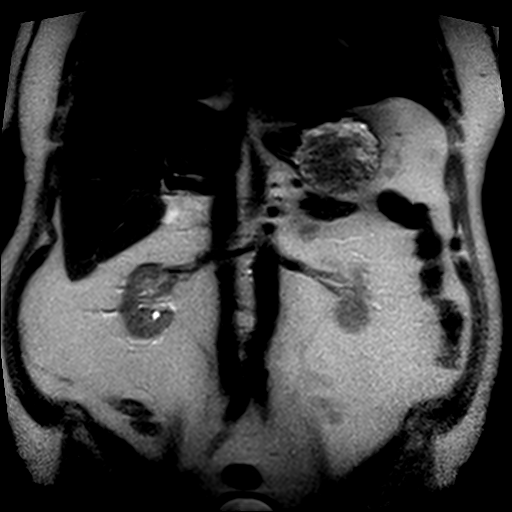
[im 30/40]
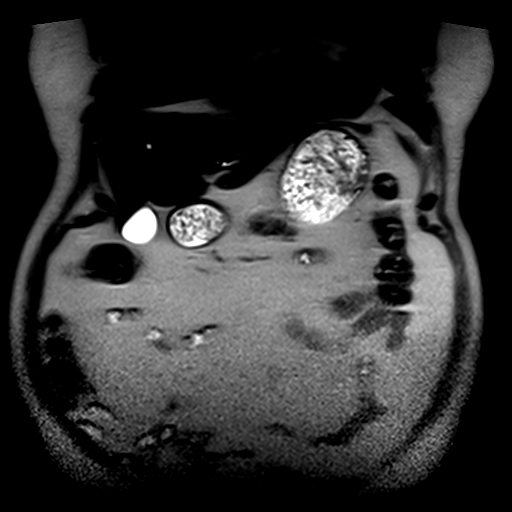
[im 40/40]
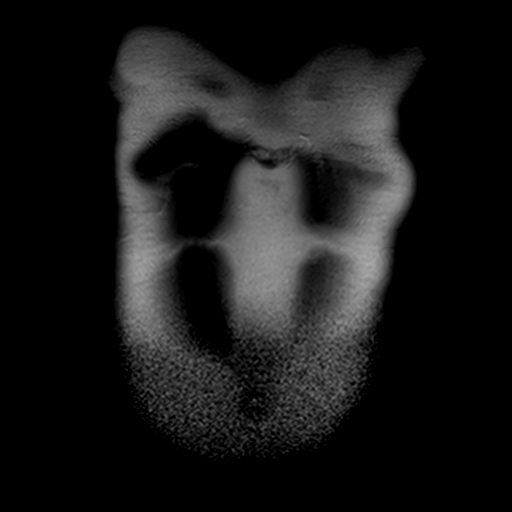

[Series 14: bSSFP · axial · 4.0mm · 0.78mm/px · z∈[-195,-55]mm · 4 of 36 slices shown]
[im 1/36]
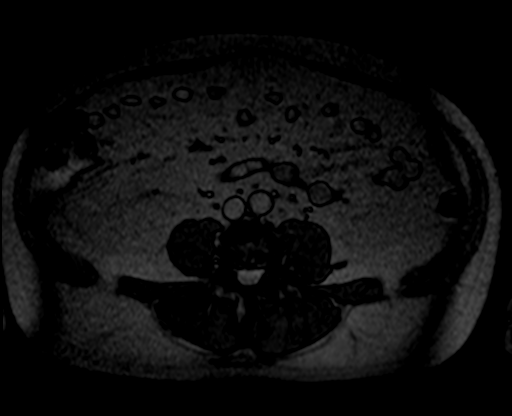
[im 12/36]
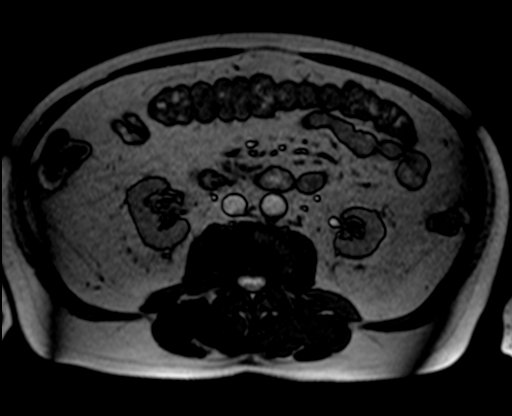
[im 24/36]
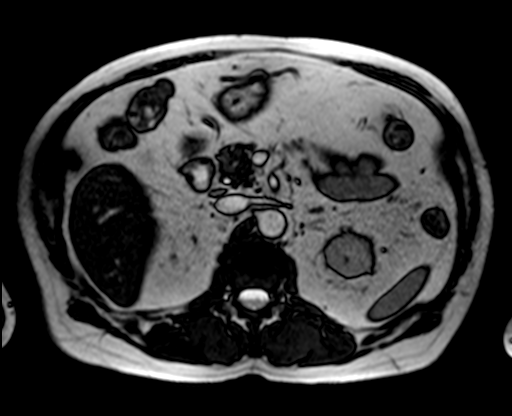
[im 36/36]
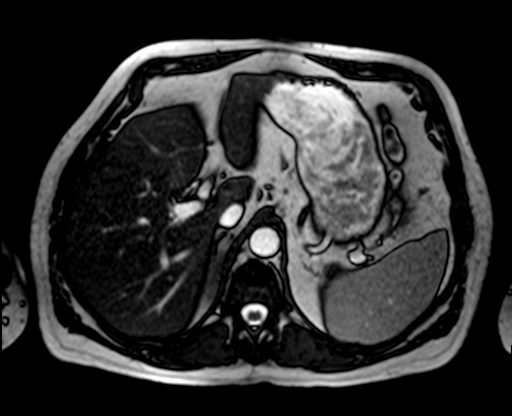

[Series 15: T1 · axial · 5.0mm · 0.78mm/px · z∈[-202,-106]mm · 4 of 46 slices shown]
[im 1/46]
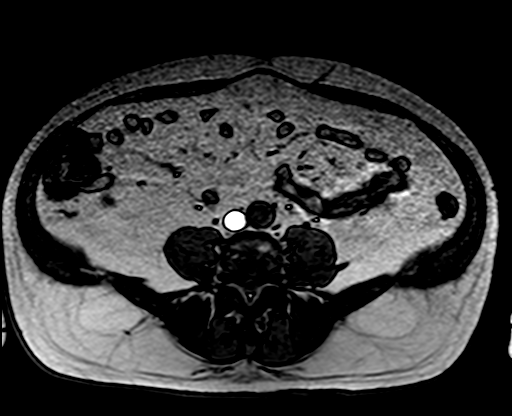
[im 12/46]
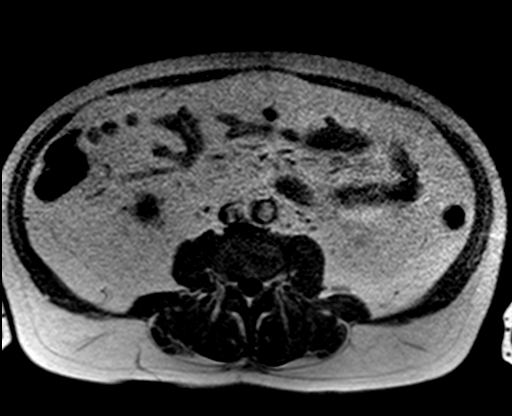
[im 23/46]
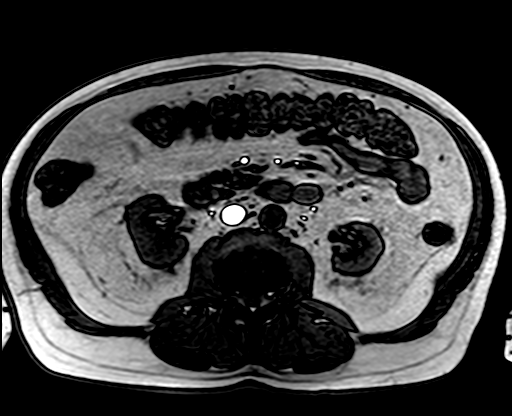
[im 34/46]
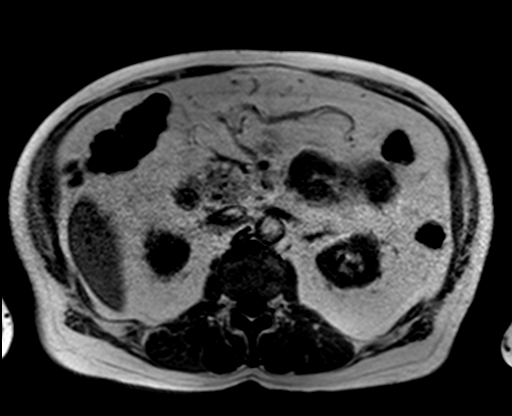

[35 of 48 positions shown; findings below may reference images not displayed]

FINDINGS: Lower chest: Unremarkable.

Hepatobiliary: Loss of signal intensity throughout the visualized
portions of the hepatic parenchyma on out of phase dual echo images,
indicative of hepatic steatosis. Very limited evaluation of the
liver. Subcentimeter T2 hyperintense lesion in segment 4A of the
liver, incompletely characterized, but likely to represent a tiny
cyst. No other discrete cystic or solid hepatic lesions on today's
noncontrast examination. No intra or extrahepatic biliary ductal
dilatation noted in the visualized portions of the abdomen.
Gallbladder is normal in appearance.

Pancreas: No definite pancreatic mass noted on today's noncontrast
examination. No pancreatic ductal dilatation. No pancreatic or
peripancreatic fluid or inflammatory changes.

Spleen:  Unremarkable.

Adrenals/Urinary Tract: Bilateral kidneys and adrenal glands are
normal in appearance on today's noncontrast examination. No
hydroureteronephrosis in the visualized portions of the abdomen.

Stomach/Bowel: Visualized portions are unremarkable.

Vascular/Lymphatic: No aneurysm identified in the visualized
abdominal vasculature. No lymphadenopathy noted in the abdomen.

Other: No significant volume of ascites noted in the visualized
portions of the abdomen.

Musculoskeletal: No aggressive appearing osseous lesions are noted
in the visualized portions of the skeleton.
IMPRESSION: 1. No definite findings to suggest metastatic disease in the abdomen
on today's noncontrast examination.
2. Specifically, no renal lesions are noted.
3. Hepatic steatosis.

## 2019-12-11 DIAGNOSIS — N184 Chronic kidney disease, stage 4 (severe): Secondary | ICD-10-CM | POA: Diagnosis not present

## 2019-12-18 DIAGNOSIS — M109 Gout, unspecified: Secondary | ICD-10-CM | POA: Diagnosis not present

## 2019-12-18 DIAGNOSIS — D631 Anemia in chronic kidney disease: Secondary | ICD-10-CM | POA: Diagnosis not present

## 2019-12-18 DIAGNOSIS — N184 Chronic kidney disease, stage 4 (severe): Secondary | ICD-10-CM | POA: Diagnosis not present

## 2019-12-18 DIAGNOSIS — D472 Monoclonal gammopathy: Secondary | ICD-10-CM | POA: Diagnosis not present

## 2019-12-18 DIAGNOSIS — N2581 Secondary hyperparathyroidism of renal origin: Secondary | ICD-10-CM | POA: Diagnosis not present

## 2020-05-01 DIAGNOSIS — E7800 Pure hypercholesterolemia, unspecified: Secondary | ICD-10-CM | POA: Diagnosis not present

## 2020-05-01 DIAGNOSIS — Z23 Encounter for immunization: Secondary | ICD-10-CM | POA: Diagnosis not present

## 2020-05-01 DIAGNOSIS — I129 Hypertensive chronic kidney disease with stage 1 through stage 4 chronic kidney disease, or unspecified chronic kidney disease: Secondary | ICD-10-CM | POA: Diagnosis not present

## 2020-05-01 DIAGNOSIS — N184 Chronic kidney disease, stage 4 (severe): Secondary | ICD-10-CM | POA: Diagnosis not present

## 2020-05-01 DIAGNOSIS — M109 Gout, unspecified: Secondary | ICD-10-CM | POA: Diagnosis not present

## 2020-06-12 DIAGNOSIS — C61 Malignant neoplasm of prostate: Secondary | ICD-10-CM | POA: Diagnosis not present

## 2020-06-19 DIAGNOSIS — N481 Balanitis: Secondary | ICD-10-CM | POA: Diagnosis not present

## 2020-06-19 DIAGNOSIS — C61 Malignant neoplasm of prostate: Secondary | ICD-10-CM | POA: Diagnosis not present

## 2020-06-29 DIAGNOSIS — D631 Anemia in chronic kidney disease: Secondary | ICD-10-CM | POA: Diagnosis not present

## 2020-06-29 DIAGNOSIS — N189 Chronic kidney disease, unspecified: Secondary | ICD-10-CM | POA: Diagnosis not present

## 2020-06-29 DIAGNOSIS — N184 Chronic kidney disease, stage 4 (severe): Secondary | ICD-10-CM | POA: Diagnosis not present

## 2020-06-29 DIAGNOSIS — M109 Gout, unspecified: Secondary | ICD-10-CM | POA: Diagnosis not present

## 2020-06-29 DIAGNOSIS — I129 Hypertensive chronic kidney disease with stage 1 through stage 4 chronic kidney disease, or unspecified chronic kidney disease: Secondary | ICD-10-CM | POA: Diagnosis not present

## 2020-06-29 DIAGNOSIS — N2581 Secondary hyperparathyroidism of renal origin: Secondary | ICD-10-CM | POA: Diagnosis not present

## 2020-07-13 ENCOUNTER — Telehealth: Payer: Self-pay | Admitting: Family Medicine

## 2020-07-13 MED ORDER — GABAPENTIN 300 MG PO CAPS
300.0000 mg | ORAL_CAPSULE | Freq: Two times a day (BID) | ORAL | 0 refills | Status: DC
Start: 2020-07-13 — End: 2020-12-23

## 2020-07-13 NOTE — Telephone Encounter (Signed)
Pt is needing a refill on his gabapentin (NEURONTIN) 300 MG capsule sent in to the [facility] on [address]

## 2020-07-13 NOTE — Telephone Encounter (Signed)
Called pharmacy no refills available.  Escribed for 3 months. Has appt 09-2020.

## 2020-09-23 DIAGNOSIS — L820 Inflamed seborrheic keratosis: Secondary | ICD-10-CM | POA: Diagnosis not present

## 2020-09-23 DIAGNOSIS — L578 Other skin changes due to chronic exposure to nonionizing radiation: Secondary | ICD-10-CM | POA: Diagnosis not present

## 2020-09-23 DIAGNOSIS — L570 Actinic keratosis: Secondary | ICD-10-CM | POA: Diagnosis not present

## 2020-09-23 DIAGNOSIS — D2272 Melanocytic nevi of left lower limb, including hip: Secondary | ICD-10-CM | POA: Diagnosis not present

## 2020-09-23 DIAGNOSIS — L821 Other seborrheic keratosis: Secondary | ICD-10-CM | POA: Diagnosis not present

## 2020-09-23 DIAGNOSIS — D225 Melanocytic nevi of trunk: Secondary | ICD-10-CM | POA: Diagnosis not present

## 2020-09-23 DIAGNOSIS — Z85828 Personal history of other malignant neoplasm of skin: Secondary | ICD-10-CM | POA: Diagnosis not present

## 2020-09-30 ENCOUNTER — Encounter: Payer: Self-pay | Admitting: Family Medicine

## 2020-09-30 ENCOUNTER — Ambulatory Visit: Payer: Medicare Other | Admitting: Family Medicine

## 2020-11-18 DIAGNOSIS — M109 Gout, unspecified: Secondary | ICD-10-CM | POA: Diagnosis not present

## 2020-11-18 DIAGNOSIS — D472 Monoclonal gammopathy: Secondary | ICD-10-CM | POA: Diagnosis not present

## 2020-11-18 DIAGNOSIS — E7800 Pure hypercholesterolemia, unspecified: Secondary | ICD-10-CM | POA: Diagnosis not present

## 2020-11-18 DIAGNOSIS — Z0000 Encounter for general adult medical examination without abnormal findings: Secondary | ICD-10-CM | POA: Diagnosis not present

## 2020-11-18 DIAGNOSIS — G629 Polyneuropathy, unspecified: Secondary | ICD-10-CM | POA: Diagnosis not present

## 2020-11-18 DIAGNOSIS — N184 Chronic kidney disease, stage 4 (severe): Secondary | ICD-10-CM | POA: Diagnosis not present

## 2020-11-18 DIAGNOSIS — Z1389 Encounter for screening for other disorder: Secondary | ICD-10-CM | POA: Diagnosis not present

## 2020-11-18 DIAGNOSIS — I129 Hypertensive chronic kidney disease with stage 1 through stage 4 chronic kidney disease, or unspecified chronic kidney disease: Secondary | ICD-10-CM | POA: Diagnosis not present

## 2020-11-18 DIAGNOSIS — C61 Malignant neoplasm of prostate: Secondary | ICD-10-CM | POA: Diagnosis not present

## 2020-11-18 DIAGNOSIS — R55 Syncope and collapse: Secondary | ICD-10-CM | POA: Diagnosis not present

## 2020-12-09 DIAGNOSIS — N184 Chronic kidney disease, stage 4 (severe): Secondary | ICD-10-CM | POA: Diagnosis not present

## 2020-12-15 DIAGNOSIS — M109 Gout, unspecified: Secondary | ICD-10-CM | POA: Diagnosis not present

## 2020-12-15 DIAGNOSIS — N184 Chronic kidney disease, stage 4 (severe): Secondary | ICD-10-CM | POA: Diagnosis not present

## 2020-12-15 DIAGNOSIS — D631 Anemia in chronic kidney disease: Secondary | ICD-10-CM | POA: Diagnosis not present

## 2020-12-15 DIAGNOSIS — I129 Hypertensive chronic kidney disease with stage 1 through stage 4 chronic kidney disease, or unspecified chronic kidney disease: Secondary | ICD-10-CM | POA: Diagnosis not present

## 2020-12-15 DIAGNOSIS — N2581 Secondary hyperparathyroidism of renal origin: Secondary | ICD-10-CM | POA: Diagnosis not present

## 2020-12-23 ENCOUNTER — Encounter: Payer: Self-pay | Admitting: Neurology

## 2020-12-23 ENCOUNTER — Other Ambulatory Visit: Payer: Self-pay

## 2020-12-23 ENCOUNTER — Ambulatory Visit (INDEPENDENT_AMBULATORY_CARE_PROVIDER_SITE_OTHER): Payer: Medicare Other | Admitting: Neurology

## 2020-12-23 VITALS — BP 152/70 | HR 65 | Ht 68.0 in | Wt 205.0 lb

## 2020-12-23 DIAGNOSIS — R42 Dizziness and giddiness: Secondary | ICD-10-CM | POA: Insufficient documentation

## 2020-12-23 DIAGNOSIS — G459 Transient cerebral ischemic attack, unspecified: Secondary | ICD-10-CM

## 2020-12-23 DIAGNOSIS — R519 Headache, unspecified: Secondary | ICD-10-CM | POA: Diagnosis not present

## 2020-12-23 DIAGNOSIS — R55 Syncope and collapse: Secondary | ICD-10-CM | POA: Diagnosis not present

## 2020-12-23 MED ORDER — GABAPENTIN 300 MG PO CAPS
300.0000 mg | ORAL_CAPSULE | Freq: Three times a day (TID) | ORAL | 3 refills | Status: DC
Start: 2020-12-23 — End: 2021-07-05

## 2020-12-23 NOTE — Progress Notes (Signed)
BP supine (x 5 minutes)   152/70  HR supine (x 5 minutes) --  65  BP sitting -- -- 154/74  HR sitting -- -- 69  BP standing (after 1 minute) -- -- 157/82  HR standing (after 1 minute) -- -- 71  BP standing (after 3 minutes) -- -- 179/93  HR standing (after 3 minutes) -- -- 80  Orthostatics Comment -- -- little bit of dizziness while standing

## 2020-12-23 NOTE — Patient Instructions (Addendum)
MRI of the brain and blood vessels Blood work Increase Gabapentin to $RemoveBefor'300mg'[id number]$  3x a day   Dizziness Dizziness is a common problem. It makes you feel unsteady or light-headed. You may feel like you are about to pass out (faint). Dizziness can lead to getting hurt if you stumble or fall. Dizziness can be caused by many things, including:  Medicines.  Not having enough water in your body (dehydration).  Illness. Follow these instructions at home: Eating and drinking  Drink enough fluid to keep your pee (urine) clear or pale yellow. This helps to keep you from getting dehydrated. Try to drink more clear fluids, such as water.  Do not drink alcohol.  Limit how much caffeine you drink or eat, if your doctor tells you to do that.  Limit how much salt (sodium) you drink or eat, if your doctor tells you to do that.   Activity  Avoid making quick movements. ? When you stand up from sitting in a chair, steady yourself until you feel okay. ? In the morning, first sit up on the side of the bed. When you feel okay, stand slowly while you hold onto something. Do this until you know that your balance is fine.  If you need to stand in one place for a long time, move your legs often. Tighten and relax the muscles in your legs while you are standing.  Do not drive or use heavy machinery if you feel dizzy.  Avoid bending down if you feel dizzy. Place items in your home so you can reach them easily without leaning over.   Lifestyle  Do not use any products that contain nicotine or tobacco, such as cigarettes and e-cigarettes. If you need help quitting, ask your doctor.  Try to lower your stress level. You can do this by using methods such as yoga or meditation. Talk with your doctor if you need help. General instructions  Watch your dizziness for any changes.  Take over-the-counter and prescription medicines only as told by your doctor. Talk with your doctor if you think that you are dizzy because  of a medicine that you are taking.  Tell a friend or a family member that you are feeling dizzy. If he or she notices any changes in your behavior, have this person call your doctor.  Keep all follow-up visits as told by your doctor. This is important. Contact a doctor if:  Your dizziness does not go away.  Your dizziness or light-headedness gets worse.  You feel sick to your stomach (nauseous).  You have trouble hearing.  You have new symptoms.  You are unsteady on your feet.  You feel like the room is spinning. Get help right away if:  You throw up (vomit) or have watery poop (diarrhea), and you cannot eat or drink anything.  You have trouble: ? Talking. ? Walking. ? Swallowing. ? Using your arms, hands, or legs.  You feel generally weak.  You are not thinking clearly, or you have trouble forming sentences. A friend or family member may notice this.  You have: ? Chest pain. ? Pain in your belly (abdomen). ? Shortness of breath. ? Sweating.  Your vision changes.  You are bleeding.  You have a very bad headache.  You have neck pain or a stiff neck.  You have a fever. These symptoms may be an emergency. Do not wait to see if the symptoms will go away. Get medical help right away. Call your local emergency services (911  in the [place] Do not drive yourself to the hospital. Summary  Dizziness makes you feel unsteady or light-headed. You may feel like you are about to pass out (faint).  Drink enough fluid to keep your pee (urine) clear or pale yellow. Do not drink alcohol.  Avoid making quick movements if you feel dizzy.  Watch your dizziness for any changes. This information is not intended to replace advice given to you by your health care provider. Make sure you discuss any questions you have with your health care provider. Document Revised: 04/15/2020 Document Reviewed: 08/11/2016 Elsevier Patient Education  [place].

## 2020-12-23 NOTE — Progress Notes (Signed)
[id number]    Provider:  Dr Jaynee Eagles Referring Provider: [name], MD Primary Care Physician:  [name], MD  CC: foot pain  Interval history 12/23/2020:  His instep on the left is bothering him, burning and shooting pains, and across the toes on both of the feet and the bottoms of the feet. He uses epsom salt and cream on it. Cream helps. He is on the Gabapentin 2x a day will try it 3x a day.   Dizziness: last 4-5 weeks he has dizziness. He has some vertigo with room spinning and dry heaves. 3 weeks ago. Much improved. Always when moving the head, room was spinning. Doesn't happen in bed, first onset was the most severe and lasted 45 minutes, improving since then, was acute in onset after he was sleeping in a recliner, not only when standing, can be happening when sitting as well. He hs complained of dizziness in the past to [place], and his blood pressure has been running high. Headache the last 4-5 days with some decreased vision in the left eye, he has not seen an eye doctor.  Patient complains of symptoms per HPI as well as the following symptoms: dizziness . Pertinent negatives and positives per HPI. All others negative   Interval history 12/23/2020: Initially seen in April 2018 for neuropathy, EMG nerve conduction study showed electrodiagnostic evidence of moderately-severe, length-dependent, axonal polyneuropathy. No acute or ongoing denervation was seen on EMG.  Extensive work-up found no other reason other than radiation, symptoms appeared after initiation of radiation improved after he stopped radiation, normal labs for angiotensin-converting enzyme, Sjogren's, B12 and folate, heavy metals, B6, Lyme, B1, he was also extensively tested by primary care and rheumatology.  He last had follow-up in February 2020 with Debbora Presto, NP.  He was started on gabapentin 300 mg 3 times a day but could not tolerate the dose, he was changed to 100 mg 3 times a day and then  titrated back again to 300 mg twice a day and he did feel benefit with the increased dose, continuing to have numbness of both fingers and toes, he was not interested in physical therapy.  Today he is back for follow-up of polyneuropathy and new symptoms of dizziness.  I read notes from his primary care, I do not see any notes about his dizziness, he did have some recent blood CMP showed a BUN of 27 and creatinine 2.15 November 18, 2020 otherwise unremarkable.  HPI:  Thomas Guerrero is a 75 y.o. male here as a referral from Dr. Laurann Montana for foot pain. PMHx gout, radiation for prostate cancer, CKD.  He had radiation for his prostate cancer. That's when he started with problems in the feet. He was being treated for gout. Gout helped the joint but not the feet. He saw Dr. Trudie Reed in rheumatology. He was having pain in the back. She sent him to dr. Nelva Bush for his back pain. He was losing his sense of balance. He fell one time but the shots in the back have significantly helped. The inside of his lower left leg feels wet. The pain in the feet are burning. Especially across the toes on the left foot and in the arch. It hurts on the dorsum of the left foot.The whole foot feels bad in the toes and up to the arch. He can;t wear tight shoes because it hurts. Pain in the feet ois worse at night. His feet get very cold. Hands get cold and fingers cramp. He puts  a neuropathy cream on at night. Worse with standing. Better with walking. The cream on the feet help a lot.  The whole foot just started hurting, but when he thinks about it he has had pain on the top of his feet for even longer maybe years. He wore boots for a long time. He worse his shoes tight.   Reviewed notes, labs and imaging from outside physicians, which showed: personally reviewed images and agree with the following:  MRI THORACIC SPINE FINDINGS  Limited sagittal imaging the cervical spine remarkable for a fairly advanced and widespread disc and endplate  degeneration. Degenerative spinal stenosis evident at C3-C4.  Alignment: Mild dextro convex thoracic scoliosis, mildly exaggerated upper thoracic kyphosis.  Thoracic segmentation appears normal.  Vertebrae: Benign vertebral body hemangioma in T11. Elsewhere thoracic spine bone marrow signal is within normal limits. No marrow edema or evidence of acute osseous abnormality.  Cord: Spinal cord signal is within normal limits at all visualized levels. Conus medullaris is normal at T12-L1.  Paraspinal and other soft tissues: Negative visualized thoracic and upper abdominal viscera. Negative visualized posterior paraspinal soft tissues.  Disc levels:  Thoracic spine degeneration with intermittent small thoracic disc herniations. The most pronounced disc disease at T7-T8 where there is a right paracentral disc extrusion as seen on series 15, image 24. This results in spinal stenosis with mild spinal cord mass effect. No cord signal abnormality. No significant thoracic spinal stenosis elsewhere.  MRI LUMBAR SPINE FINDINGS  Segmentation: Normal lumbar segmentation, concordant with the thoracic spine numbering.  Alignment:  Normal.  Vertebrae: Mildly heterogeneous bone marrow signal in the upper lumbar spine. Increased T1 bone marrow signal in the lower lumbar spine and visible pelvis suggesting previous radiation therapy. No suspicious marrow lesion. No marrow edema or evidence of acute osseous abnormality.  Conus medullaris: Extends to the T12-L1 level and appears normal. Cauda equina nerve roots are within normal limits.  Paraspinal and other soft tissues: Negative visualized abdominal viscera. Negative visualized posterior paraspinal soft tissues.  Disc levels:  Lumbar spine degeneration with significant stenosis at:  L2-L3: Right eccentric circumferential disc bulge with endplate spurring and facet hypertrophy. Mild to moderate right lateral recess and  right L2 foraminal stenosis.  L4-L5: Circumferential disc bulge with broad-based posterior component. Moderate facet and ligament flavum hypertrophy. Mild spinal and lateral recess stenosis. Mild bilateral L4 foraminal stenosis.  IMPRESSION: MR THORACIC SPINE IMPRESSION  1. No osseous metastatic disease in the thoracic spine. 2. Thoracic spine degeneration with degenerative spinal stenosis at T7-T8 resulting in spinal cord mass effect but no thoracic spinal cord signal abnormality. 3. Partially visible advanced cervical spine degeneration with degenerative cervical spinal stenosis.  MR LUMBAR SPINE IMPRESSION  1. No osseous metastatic disease in the lumbar spine. Evidence of previous lower lumbar and pelvic radiation. 2. Degenerative mild lumbar spinal stenosis and other neural impingement at L4-L5, and L2-L3.   Electronically Signed   By: Genevie Ann M.D.   On: 04/26/2016 18:44  Review of Systems: Patient complains of symptoms per HPI as well as the following symptoms: No CP, no SOB. Pertinent negatives per HPI. All others negative.   Social History   Socioeconomic History  . Marital status: Married    Spouse name: Not on file  . Number of children: 2  . Years of education: Some college  . Highest education level: Not on file  Occupational History  . Occupation: Retired  Tobacco Use  . Smoking status: Former Smoker    Years:  25.00    Types: Cigarettes    Start date: 08/08/1996  . Smokeless tobacco: Never Used  Vaping Use  . Vaping Use: Never used  Substance and Sexual Activity  . Alcohol use: Not Currently    Alcohol/week: 0.0 standard drinks  . Drug use: No  . Sexual activity: Yes  Other Topics Concern  . Not on file  Social History Narrative   Lives at home w/ his wife and daughter   Right-handed   Caffeine: 1 soda per day   Social Determinants of Health   Financial Resource Strain: Not on file  Food Insecurity: Not on file  Transportation Needs:  Not on file  Physical Activity: Not on file  Stress: Not on file  Social Connections: Not on file  Intimate Partner Violence: Not on file    Family History  Problem Relation Age of Onset  . Cancer Son        leukemia  . Neuropathy Neg Hx     Past Medical History:  Diagnosis Date  . Chronic kidney disease   . GERD (gastroesophageal reflux disease)   . High cholesterol   . Hx of gout   . Hypertension   . MGUS (monoclonal gammopathy of unknown significance)   . Prostate cancer [facility])     Past Surgical History:  Procedure Laterality Date  . Biopsy of the Prostate    . COLONOSCOPY  10/17/2016  . MOHS SURGERY  04/2019   forehead    Current Outpatient Medications  Medication Sig Dispense Refill  . allopurinol (ZYLOPRIM) 100 MG tablet Take 100 mg by mouth daily.    Marland Kitchen atorvastatin (LIPITOR) 10 MG tablet Take 10 mg by mouth daily.  11  . cholecalciferol (VITAMIN D) 1000 units tablet Take 1,000 Units by mouth daily.    . Cyanocobalamin (VITAMIN B-12 PO)     . famotidine (PEPCID) 10 MG tablet Take 10 mg by mouth daily as needed. Reported on 09/18/2015    . ferrous sulfate 325 (65 FE) MG tablet Take 325 mg by mouth daily with breakfast.    . lisinopril (ZESTRIL) 10 MG tablet Take 20 mg by mouth daily.    Marland Kitchen triamcinolone cream (KENALOG) 0.1 % Apply 1 application topically 2 (two) times daily as needed.    . gabapentin (NEURONTIN) 300 MG capsule Take 1 capsule (300 mg total) by mouth 3 (three) times daily. 270 capsule 3   No current facility-administered medications for this visit.    Allergies as of 12/23/2020  . (No Known Allergies)    Vitals: BP (!) 152/70 (BP Location: Left Arm, Patient Position: Supine)   Pulse 65   Ht '5\' 8"'  (1.727 m)   Wt 205 lb (93 kg)   BMI 31.17 kg/m  Last Weight:  Wt Readings from Last 1 Encounters:  12/23/20 205 lb (93 kg)   Last Height:   Ht Readings from Last 1 Encounters:  12/23/20 '5\' 8"'  (1.727 m)   Exam: NAD, pleasant                   Speech:    Speech is normal; fluent and spontaneous with normal comprehension.  Cognition:    The patient is oriented to person, place, and time;     recent and remote memory intact;     language fluent;    Cranial Nerves:    The pupils are equal, round, and reactive to light.Trigeminal sensation is intact and the muscles of mastication are normal. The face is  symmetric. The palate elevates in the midline. Hearing intact. Voice is normal. Shoulder shrug is normal. The tongue has normal motion without fasciculations.   Coordination:  No dysmetria  Motor Observation:    No asymmetry, no atrophy, and no involuntary movements noted. Tone:    Normal muscle tone.     Strength:    Strength is V/V in the upper and lower limbs.      Sensation: intact to LT    Sensation(stable): Dec pp and temp up to the knees and elbows Absent vibration great toe, a few secs mcp, 8 seconds ankle Absent proprioception in toes, intact in fingers Neg Romberg     Reflex Exam:  DTR's:    Absent AJs     Assessment/Plan:   75 y.o. male here as a referral from Dr. Laurann Montana for neuropathy and new onset dizziness (although I do see he has mentioned dizziness in past appointments with me)..Exam shows significant decrease in all sensory modalities to above the knees and elbows. Started with radiation and gout treatment (colchicine related neuropathy?)  - dizziness: MRI brain and MRA head to evaluate for stroke or vascular causes Headache and left eye vision changes: See eye doctor, check esr/crp although may be elevated dur to multiple medical conditions and if that is the case would send for biopsy before starting any steroids due to low likelihood of GCA based on symptoms.  - Polyneuropathy: Initially seen in April 2018 for neuropathy, EMG nerve conduction study showed electrodiagnostic evidence of moderately-severe, length-dependent, axonal polyneuropathy. No acute or ongoing denervation was seen on EMG.   Extensive work-up found no other reason other than radiation vs colchicine, symptoms appeared after initiation of radiation improved after he stopped radiation, normal labs for angiotensin-converting enzyme, Sjogren's, B12 and folate, heavy metals, B6, Lyme, B1, he was also extensively tested by primary care and rheumatology. Increase Gabapentin to 37m tid. Can consider the new Qutenza in the future.   Orders Placed This Encounter  Procedures  . MR BRAIN WO CONTRAST  . MR ANGIO HEAD WO CONTRAST  . Sedimentation rate  . C-reactive protein   Meds ordered this encounter  Medications  . gabapentin (NEURONTIN) 300 MG capsule    Sig: Take 1 capsule (300 mg total) by mouth 3 (three) times daily.    Dispense:  270 capsule    Refill:  3     Reviewed labs from Dr. HLenna Gilfordaddendum 11/24/2016: Uric acid normal, CK 62, CBC showed anemia hemoglobin 9.7 and hematocrit 99.5 with MCV of 99, low platelets 147, ESR 33, rheumatoid factor and CCP antibodies negative, CRP normal, CMP elevated calcium 10.4 otherwise EGFR reduced due to creatinine 1.78, B12 435, TSH normal.  ASarina Ill MD  [facility] [address] [address] Phone [phone] [phone] I spent over 30 minutes of face-to-face and non-face-to-face time with patient on the  1. Dizziness   2. Vertigo   3. Pre-syncope   4. Left temporal headache   5. Acute nonintractable headache, unspecified headache type   6. TIA (transient ischemic attack)    diagnosis.  This included previsit chart review, lab review, study review, order entry, electronic health record documentation, patient education on the different diagnostic and therapeutic options, counseling and coordination of care, risks and benefits of management, compliance, or risk factor reduction

## 2020-12-24 ENCOUNTER — Telehealth: Payer: Self-pay | Admitting: Neurology

## 2020-12-24 LAB — SEDIMENTATION RATE: Sed Rate: 22 mm/hr (ref 0–30)

## 2020-12-24 LAB — C-REACTIVE PROTEIN: CRP: 1 mg/L (ref 0–10)

## 2020-12-24 NOTE — Telephone Encounter (Signed)
MRA & MRI sent to [facility]. NPR for [employer]/Medicare A & B.

## 2020-12-28 DIAGNOSIS — I129 Hypertensive chronic kidney disease with stage 1 through stage 4 chronic kidney disease, or unspecified chronic kidney disease: Secondary | ICD-10-CM | POA: Diagnosis not present

## 2020-12-28 DIAGNOSIS — N184 Chronic kidney disease, stage 4 (severe): Secondary | ICD-10-CM | POA: Diagnosis not present

## 2021-01-02 ENCOUNTER — Ambulatory Visit
Admission: RE | Admit: 2021-01-02 | Discharge: 2021-01-02 | Disposition: A | Discharge status: Home / Self Care | Payer: Medicare Other | Source: Ambulatory Visit | Attending: Neurology | Admitting: Neurology

## 2021-01-02 ENCOUNTER — Other Ambulatory Visit: Payer: Self-pay

## 2021-01-02 DIAGNOSIS — G459 Transient cerebral ischemic attack, unspecified: Secondary | ICD-10-CM | POA: Diagnosis not present

## 2021-01-02 DIAGNOSIS — R42 Dizziness and giddiness: Secondary | ICD-10-CM | POA: Diagnosis not present

## 2021-01-02 DIAGNOSIS — R55 Syncope and collapse: Secondary | ICD-10-CM

## 2021-01-02 DIAGNOSIS — R519 Headache, unspecified: Secondary | ICD-10-CM

## 2021-01-29 DIAGNOSIS — N481 Balanitis: Secondary | ICD-10-CM | POA: Diagnosis not present

## 2021-01-29 DIAGNOSIS — C61 Malignant neoplasm of prostate: Secondary | ICD-10-CM | POA: Diagnosis not present

## 2021-04-27 DIAGNOSIS — N184 Chronic kidney disease, stage 4 (severe): Secondary | ICD-10-CM | POA: Diagnosis not present

## 2021-05-05 DIAGNOSIS — D631 Anemia in chronic kidney disease: Secondary | ICD-10-CM | POA: Diagnosis not present

## 2021-05-05 DIAGNOSIS — I129 Hypertensive chronic kidney disease with stage 1 through stage 4 chronic kidney disease, or unspecified chronic kidney disease: Secondary | ICD-10-CM | POA: Diagnosis not present

## 2021-05-05 DIAGNOSIS — Z23 Encounter for immunization: Secondary | ICD-10-CM | POA: Diagnosis not present

## 2021-05-05 DIAGNOSIS — N2581 Secondary hyperparathyroidism of renal origin: Secondary | ICD-10-CM | POA: Diagnosis not present

## 2021-05-05 DIAGNOSIS — M109 Gout, unspecified: Secondary | ICD-10-CM | POA: Diagnosis not present

## 2021-05-05 DIAGNOSIS — N184 Chronic kidney disease, stage 4 (severe): Secondary | ICD-10-CM | POA: Diagnosis not present

## 2021-05-27 DIAGNOSIS — G629 Polyneuropathy, unspecified: Secondary | ICD-10-CM | POA: Diagnosis not present

## 2021-05-27 DIAGNOSIS — I129 Hypertensive chronic kidney disease with stage 1 through stage 4 chronic kidney disease, or unspecified chronic kidney disease: Secondary | ICD-10-CM | POA: Diagnosis not present

## 2021-05-27 DIAGNOSIS — E7800 Pure hypercholesterolemia, unspecified: Secondary | ICD-10-CM | POA: Diagnosis not present

## 2021-06-24 DIAGNOSIS — I129 Hypertensive chronic kidney disease with stage 1 through stage 4 chronic kidney disease, or unspecified chronic kidney disease: Secondary | ICD-10-CM | POA: Diagnosis not present

## 2021-06-24 DIAGNOSIS — N184 Chronic kidney disease, stage 4 (severe): Secondary | ICD-10-CM | POA: Diagnosis not present

## 2021-06-29 NOTE — Progress Notes (Signed)
PATIENT: Thomas Guerrero DOB: 1945-08-13  REASON FOR VISIT: follow up HISTORY FROM: patient  Chief Complaint  Patient presents with   Follow-up    Pt alone, rm 11. Pt overall states things are stable. No new complaints.      HISTORY OF PRESENT ILLNESS: 07/05/21 ALL: Thomas Guerrero returns for follow up for neuropathy, dizziness and headaches. He was seen by Dr Jaynee Eagles for 4-5 week history of dizziness 12/2020. MRI/MRA unremarkable. SED and CRP normal. He was encouraged to get updated eye exam. Gabapentin was increased to 300mg  TID as shooting pains in feel were worse. Since, He reports doing better. Dizziness has improved. He occasionally has an episode where he feels off balance, usually when standing. He denies falls. Neuropathy is fairly stable. He is using a cooper fit arch wrap that has helped significantly. He continues gabapentin BID every day. He will take an extra dose as needed. He did update eye exam and received new bifocal glasses. He has had a hard time getting used to them. He denies headaches since last being seen.   12/23/2020 AA: His instep on the left is bothering him, burning and shooting pains, and across the toes on both of the feet and the bottoms of the feet. He uses epsom salt and cream on it. Cream helps. He is on the Gabapentin 2x a day will try it 3x a day.    Dizziness: last 4-5 weeks he has dizziness. He has some vertigo with room spinning and dry heaves. 3 weeks ago. Much improved. Always when moving the head, room was spinning. Doesn't happen in bed, first onset was the most severe and lasted 45 minutes, improving since then, was acute in onset after he was sleeping in a recliner, not only when standing, can be happening when sitting as well. He hs complained of dizziness in the past to [place], and his blood pressure has been running high. Headache the last 4-5 days with some decreased vision in the left eye, he has not seen an eye doctor.  09/30/2019 ALL: Thomas Guerrero  is a 75 y.o. male here today for follow up for polyneuropathy. He continues gabapentin 300mg  twice daily. He feels that neuropathy pain is about the same. No alleviating or aggravating factors. He has started using Megalife Diabetic Cream as well that seems to provide relief. He continues to work part time and tries to stay very active. No falls or changes in gait.   HISTORY: (copied from my note on 09/20/2018)  Thomas Guerrero is a 75 y.o. male here today for follow up.  He reports that he is doing very well.  He was initially started on gabapentin 300 mg 3 times a day but could not tolerate this dose.  Gabapentin was changed to 100 mg 3 times a day.  He reports that he has been on this for quite some time.  He had ran out of his medication recently and went back to taking 300 mg twice a day.  He did feel noted benefit with the increase in dose.  He does continue to have numbness of both fingers and toes.  He is having a little bit more difficulty holding onto things.  He denies any falls.  He is not interested in physical therapy at this time.  He denies weakness.   HISTORY: (copied from Dr Cathren Laine note on 10/05/2016)  HPI:  Thomas Guerrero is a 75 y.o. male here as a referral from Dr. Laurann Montana for foot pain. PMHx gout, radiation  for prostate cancer, CKD.  He had radiation for his prostate cancer. That's when he started with problems in the feet. He was being treated for gout. Gout helped the joint but not the feet. He saw Dr. Trudie Thomas in rheumatology. He was having pain in the back. She sent him to dr. Nelva Bush for his back pain. He was losing his sense of balance. He fell one time but the shots in the back have significantly helped. The inside of his lower left leg feels wet. The pain in the feet are burning. Especially across the toes on the left foot and in the arch. It hurts on the dorsum of the left foot.The whole foot feels bad in the toes and up to the arch. He can;t wear tight shoes because it hurts. Pain  in the feet ois worse at night. His feet get very cold. Hands get cold and fingers cramp. He puts a neuropathy cream on at night. Worse with standing. Better with walking. The cream on the feet help a lot.  The whole foot just started hurting, but when he thinks about it he has had pain on the top of his feet for even longer maybe years. He wore boots for a long time. He worse his shoes tight.   REVIEW OF SYSTEMS: Out of a complete 14 system review of symptoms, the patient complains only of the following symptoms, numbness, tingling, dizziness, and all other reviewed systems are negative.  ALLERGIES: No Known Allergies  HOME MEDICATIONS: Outpatient Medications Prior to Visit  Medication Sig Dispense Refill   allopurinol (ZYLOPRIM) 100 MG tablet Take 100 mg by mouth daily.     amLODipine (NORVASC) 2.5 MG tablet Take 5 mg by mouth daily.     atorvastatin (LIPITOR) 10 MG tablet Take 10 mg by mouth daily.  11   cholecalciferol (VITAMIN D) 1000 units tablet Take 1,000 Units by mouth daily.     Cyanocobalamin (VITAMIN B-12 PO)      famotidine (PEPCID) 10 MG tablet Take 10 mg by mouth daily as needed. Reported on 09/18/2015     ferrous sulfate 325 (65 FE) MG tablet Take 325 mg by mouth daily with breakfast.     gabapentin (NEURONTIN) 300 MG capsule Take 1 capsule (300 mg total) by mouth 3 (three) times daily. 270 capsule 3   lisinopril (ZESTRIL) 20 MG tablet Take 20 mg by mouth daily.     triamcinolone cream (KENALOG) 0.1 % Apply 1 application topically 2 (two) times daily as needed.     lisinopril (ZESTRIL) 10 MG tablet Take 20 mg by mouth daily.     No facility-administered medications prior to visit.    PAST MEDICAL HISTORY: Past Medical History:  Diagnosis Date   Chronic kidney disease    GERD (gastroesophageal reflux disease)    High cholesterol    Hx of gout    Hypertension    MGUS (monoclonal gammopathy of unknown significance)    Prostate cancer ([place])     PAST SURGICAL  HISTORY: Past Surgical History:  Procedure Laterality Date   Biopsy of the Prostate     COLONOSCOPY  10/17/2016   MOHS SURGERY  04/2019   forehead    FAMILY HISTORY: Family History  Problem Relation Age of Onset   Cancer Son        leukemia   Neuropathy Neg Hx     SOCIAL HISTORY: Social History   Socioeconomic History   Marital status: Married    Spouse name: Not on  file   Number of children: 2   Years of education: Some college   Highest education level: Not on file  Occupational History   Occupation: Retired  Tobacco Use   Smoking status: Former    Years: 25.00    Types: Cigarettes    Start date: 08/08/1996   Smokeless tobacco: Never  Vaping Use   Vaping Use: Never used  Substance and Sexual Activity   Alcohol use: Not Currently    Alcohol/week: 0.0 standard drinks   Drug use: No   Sexual activity: Yes  Other Topics Concern   Not on file  Social History Narrative   Lives at home w/ his wife and daughter   Right-handed   Caffeine: 1 soda per day   Social Determinants of Health   Financial Resource Strain: Not on file  Food Insecurity: Not on file  Transportation Needs: Not on file  Physical Activity: Not on file  Stress: Not on file  Social Connections: Not on file  Intimate Partner Violence: Not on file      PHYSICAL EXAM  Vitals:   07/05/21 0735  BP: (!) 147/68  Pulse: 70  Weight: 201 lb (91.2 kg)  Height: $Remove'5\' 8"'BWhBLxg$  (1.727 m)    Body mass index is 30.56 kg/m.  Generalized: Well developed, in no acute distress  Cardiology: normal rate and rhythm, no murmur noted Respiratory: clear to auscultation bilaterally  Neurological examination  Mentation: Alert oriented to time, place, history taking. Follows all commands speech and language fluent Cranial nerve II-XII: Pupils were equal round reactive to light. Extraocular movements were full, visual field were full  Motor: The motor testing reveals 5 over 5 strength of all 4 extremities. Good  symmetric motor tone is noted throughout.  Sensory: Sensory testing is intact to soft touch on all 4 extremities with exception of mildly decreased sensation bilaterally from mid calf to feet. No evidence of extinction is noted.   Gait and station: Gait is stable without assistive device.   DIAGNOSTIC DATA (LABS, IMAGING, TESTING) - I reviewed patient records, labs, notes, testing and imaging myself where available.  No flowsheet data found.   Lab Results  Component Value Date   WBC 4.7 08/25/2017   HGB 10.2 (L) 08/25/2017   HCT 31.0 (L) 08/25/2017   MCV 99.9 (H) 08/25/2017   PLT 139 (L) 08/25/2017      Component Value Date/Time   NA 140 08/25/2017 0925   K 4.5 08/25/2017 0925   CL 108 08/25/2017 0925   CO2 22 08/25/2017 0925   GLUCOSE 112 08/25/2017 0925   BUN 37 (H) 08/25/2017 0925   CREATININE 2.92 (H) 08/25/2017 0925   CALCIUM 10.8 (H) 08/25/2017 0925   PROT 7.1 08/25/2017 0925   PROT 6.4 11/24/2016 1304   ALBUMIN 3.9 08/25/2017 0925   AST 21 08/25/2017 0925   ALT 18 08/25/2017 0925   ALKPHOS 74 08/25/2017 0925   BILITOT 0.5 08/25/2017 0925   GFRNONAA 20 (L) 08/25/2017 0925   GFRAA 23 (L) 08/25/2017 0925   No results found for: CHOL, HDL, LDLCALC, LDLDIRECT, TRIG, CHOLHDL No results found for: HGBA1C Lab Results  Component Value Date   VITAMINB12 823 11/24/2016   No results found for: TSH     ASSESSMENT AND PLAN 75 y.o. year old male  has a past medical history of Chronic kidney disease, GERD (gastroesophageal reflux disease), High cholesterol, gout, Hypertension, MGUS (monoclonal gammopathy of unknown significance), and Prostate cancer ([place]). here with  ICD-10-CM   1. Polyneuropathy  G62.9     2. Dizziness  R42        Mr Schrecengost is doing better. Dizziness has improved and headaches have resolved. He will continue gabapentin 300mg  2-3 times daily. We have discussed using a walking stick or cane for stability. Fall prevention discussed. He will keep an  eye on his blood pressure. He was encouraged to stay active. Eat a healthy, well balanced diet and drink plenty of water. He will follow up with me in 1 year, sooner if needed. He verbalizes understanding and agreement with this plan.    No orders of the defined types were placed in this encounter.    No orders of the defined types were placed in this encounter.    Debbora Presto, FNP-C 07/05/2021, 8:18 AM [facility] [address] [place], [place] [address] [phone]

## 2021-06-29 NOTE — Patient Instructions (Addendum)
Below is our plan:  We will continue gabapentin 2-3 times a day. Consider using a walking stick or cane for stability.   Please make sure you are staying well hydrated. I recommend 50-60 ounces daily. Well balanced diet and regular exercise encouraged. Consistent sleep schedule with 6-8 hours recommended.   Please continue follow up with care team as directed.   Follow up with me in 1 year   You may receive a survey regarding today's visit. I encourage you to leave honest feed back as I do use this information to improve patient care. Thank you for seeing me today!

## 2021-07-05 ENCOUNTER — Encounter: Payer: Self-pay | Admitting: Family Medicine

## 2021-07-05 ENCOUNTER — Ambulatory Visit (INDEPENDENT_AMBULATORY_CARE_PROVIDER_SITE_OTHER): Payer: Medicare Other | Admitting: Family Medicine

## 2021-07-05 VITALS — BP 147/68 | HR 70 | Ht 68.0 in | Wt 201.0 lb

## 2021-07-05 DIAGNOSIS — R42 Dizziness and giddiness: Secondary | ICD-10-CM | POA: Diagnosis not present

## 2021-07-05 DIAGNOSIS — G629 Polyneuropathy, unspecified: Secondary | ICD-10-CM

## 2021-07-05 MED ORDER — GABAPENTIN 300 MG PO CAPS: 300.0000 mg | ORAL_CAPSULE | Freq: Three times a day (TID) | ORAL | 3 refills | Status: AC

## 2021-08-08 HISTORY — PX: EYE SURGERY: SHX253

## 2021-09-08 DIAGNOSIS — C61 Malignant neoplasm of prostate: Secondary | ICD-10-CM | POA: Diagnosis not present

## 2021-09-15 DIAGNOSIS — N5201 Erectile dysfunction due to arterial insufficiency: Secondary | ICD-10-CM | POA: Diagnosis not present

## 2021-09-15 DIAGNOSIS — C61 Malignant neoplasm of prostate: Secondary | ICD-10-CM | POA: Diagnosis not present

## 2021-09-23 DIAGNOSIS — L578 Other skin changes due to chronic exposure to nonionizing radiation: Secondary | ICD-10-CM | POA: Diagnosis not present

## 2021-09-23 DIAGNOSIS — D225 Melanocytic nevi of trunk: Secondary | ICD-10-CM | POA: Diagnosis not present

## 2021-09-23 DIAGNOSIS — Z85828 Personal history of other malignant neoplasm of skin: Secondary | ICD-10-CM | POA: Diagnosis not present

## 2021-09-23 DIAGNOSIS — D2261 Melanocytic nevi of right upper limb, including shoulder: Secondary | ICD-10-CM | POA: Diagnosis not present

## 2021-09-23 DIAGNOSIS — Z23 Encounter for immunization: Secondary | ICD-10-CM | POA: Diagnosis not present

## 2021-09-23 DIAGNOSIS — D2272 Melanocytic nevi of left lower limb, including hip: Secondary | ICD-10-CM | POA: Diagnosis not present

## 2021-09-23 DIAGNOSIS — L570 Actinic keratosis: Secondary | ICD-10-CM | POA: Diagnosis not present

## 2021-09-23 DIAGNOSIS — L821 Other seborrheic keratosis: Secondary | ICD-10-CM | POA: Diagnosis not present

## 2021-10-28 DIAGNOSIS — N184 Chronic kidney disease, stage 4 (severe): Secondary | ICD-10-CM | POA: Diagnosis not present

## 2021-11-03 DIAGNOSIS — D631 Anemia in chronic kidney disease: Secondary | ICD-10-CM | POA: Diagnosis not present

## 2021-11-03 DIAGNOSIS — D472 Monoclonal gammopathy: Secondary | ICD-10-CM | POA: Diagnosis not present

## 2021-11-03 DIAGNOSIS — N184 Chronic kidney disease, stage 4 (severe): Secondary | ICD-10-CM | POA: Diagnosis not present

## 2021-11-03 DIAGNOSIS — N2581 Secondary hyperparathyroidism of renal origin: Secondary | ICD-10-CM | POA: Diagnosis not present

## 2021-11-03 DIAGNOSIS — I129 Hypertensive chronic kidney disease with stage 1 through stage 4 chronic kidney disease, or unspecified chronic kidney disease: Secondary | ICD-10-CM | POA: Diagnosis not present

## 2021-11-03 DIAGNOSIS — M109 Gout, unspecified: Secondary | ICD-10-CM | POA: Diagnosis not present

## 2021-11-22 DIAGNOSIS — L570 Actinic keratosis: Secondary | ICD-10-CM | POA: Diagnosis not present

## 2022-04-25 DIAGNOSIS — N184 Chronic kidney disease, stage 4 (severe): Secondary | ICD-10-CM | POA: Diagnosis not present

## 2022-05-12 DIAGNOSIS — Z23 Encounter for immunization: Secondary | ICD-10-CM | POA: Diagnosis not present

## 2022-05-12 DIAGNOSIS — M109 Gout, unspecified: Secondary | ICD-10-CM | POA: Diagnosis not present

## 2022-05-12 DIAGNOSIS — D631 Anemia in chronic kidney disease: Secondary | ICD-10-CM | POA: Diagnosis not present

## 2022-05-12 DIAGNOSIS — I129 Hypertensive chronic kidney disease with stage 1 through stage 4 chronic kidney disease, or unspecified chronic kidney disease: Secondary | ICD-10-CM | POA: Diagnosis not present

## 2022-05-12 DIAGNOSIS — D472 Monoclonal gammopathy: Secondary | ICD-10-CM | POA: Diagnosis not present

## 2022-05-12 DIAGNOSIS — N1832 Chronic kidney disease, stage 3b: Secondary | ICD-10-CM | POA: Diagnosis not present

## 2022-05-12 DIAGNOSIS — N2581 Secondary hyperparathyroidism of renal origin: Secondary | ICD-10-CM | POA: Diagnosis not present

## 2022-05-13 DIAGNOSIS — Z0000 Encounter for general adult medical examination without abnormal findings: Secondary | ICD-10-CM | POA: Diagnosis not present

## 2022-05-13 DIAGNOSIS — M109 Gout, unspecified: Secondary | ICD-10-CM | POA: Diagnosis not present

## 2022-05-13 DIAGNOSIS — E7800 Pure hypercholesterolemia, unspecified: Secondary | ICD-10-CM | POA: Diagnosis not present

## 2022-05-13 DIAGNOSIS — N184 Chronic kidney disease, stage 4 (severe): Secondary | ICD-10-CM | POA: Diagnosis not present

## 2022-05-13 DIAGNOSIS — I129 Hypertensive chronic kidney disease with stage 1 through stage 4 chronic kidney disease, or unspecified chronic kidney disease: Secondary | ICD-10-CM | POA: Diagnosis not present

## 2022-05-13 DIAGNOSIS — C61 Malignant neoplasm of prostate: Secondary | ICD-10-CM | POA: Diagnosis not present

## 2022-05-13 DIAGNOSIS — G629 Polyneuropathy, unspecified: Secondary | ICD-10-CM | POA: Diagnosis not present

## 2022-06-02 DIAGNOSIS — H35373 Puckering of macula, bilateral: Secondary | ICD-10-CM | POA: Diagnosis not present

## 2022-06-02 DIAGNOSIS — H353131 Nonexudative age-related macular degeneration, bilateral, early dry stage: Secondary | ICD-10-CM | POA: Diagnosis not present

## 2022-06-02 DIAGNOSIS — H2513 Age-related nuclear cataract, bilateral: Secondary | ICD-10-CM | POA: Diagnosis not present

## 2022-06-02 DIAGNOSIS — H3581 Retinal edema: Secondary | ICD-10-CM | POA: Diagnosis not present

## 2022-06-17 DIAGNOSIS — H35372 Puckering of macula, left eye: Secondary | ICD-10-CM | POA: Diagnosis not present

## 2022-06-24 DIAGNOSIS — H35373 Puckering of macula, bilateral: Secondary | ICD-10-CM | POA: Diagnosis not present

## 2022-06-24 DIAGNOSIS — H3581 Retinal edema: Secondary | ICD-10-CM | POA: Diagnosis not present

## 2022-07-07 NOTE — Patient Instructions (Incomplete)

## 2022-07-07 NOTE — Progress Notes (Deleted)
PATIENT: Thomas Guerrero DOB: 21-Jun-1946  REASON FOR VISIT: follow up HISTORY FROM: patient  No chief complaint on file.   HISTORY OF PRESENT ILLNESS:  07/07/22 ALL: Thomas Guerrero returns for follow up for neuropathy and headaches. He was last seen 06/2021 and doing well. He continues gabapentin 300mg  2-3 times daily??  07/05/2021 ALL: Thomas Guerrero returns for follow up for neuropathy, dizziness and headaches. He was seen by Dr Jaynee Eagles for 4-5 week history of dizziness 12/2020. MRI/MRA unremarkable. SED and CRP normal. He was encouraged to get updated eye exam. Gabapentin was increased to 300mg  TID as shooting pains in feel were worse. Since, He reports doing better. Dizziness has improved. He occasionally has an episode where he feels off balance, usually when standing. He denies falls. Neuropathy is fairly stable. He is using a cooper fit arch wrap that has helped significantly. He continues gabapentin BID every day. He will take an extra dose as needed. He did update eye exam and received new bifocal glasses. He has had a hard time getting used to them. He denies headaches since last being seen.   12/23/2020 AA: His instep on the left is bothering him, burning and shooting pains, and across the toes on both of the feet and the bottoms of the feet. He uses epsom salt and cream on it. Cream helps. He is on the Gabapentin 2x a day will try it 3x a day.    Dizziness: last 4-5 weeks he has dizziness. He has some vertigo with room spinning and dry heaves. 3 weeks ago. Much improved. Always when moving the head, room was spinning. Doesn't happen in bed, first onset was the most severe and lasted 45 minutes, improving since then, was acute in onset after he was sleeping in a recliner, not only when standing, can be happening when sitting as well. He hs complained of dizziness in the past to [place], and his blood pressure has been running high. Headache the last 4-5 days with some decreased vision in the left eye,  he has not seen an eye doctor.  09/30/2019 ALL: Thomas Guerrero is a 76 y.o. male here today for follow up for polyneuropathy. He continues gabapentin 300mg  twice daily. He feels that neuropathy pain is about the same. No alleviating or aggravating factors. He has started using [employer] Diabetic Cream as well that seems to provide relief. He continues to work part time and tries to stay very active. No falls or changes in gait.   HISTORY: (copied from my note on 09/20/2018)  Thomas Guerrero is a 77 y.o. male here today for follow up.  He reports that he is doing very well.  He was initially started on gabapentin 300 mg 3 times a day but could not tolerate this dose.  Gabapentin was changed to 100 mg 3 times a day.  He reports that he has been on this for quite some time.  He had ran out of his medication recently and went back to taking 300 mg twice a day.  He did feel noted benefit with the increase in dose.  He does continue to have numbness of both fingers and toes.  He is having a little bit more difficulty holding onto things.  He denies any falls.  He is not interested in physical therapy at this time.  He denies weakness.   HISTORY: (copied from Dr Cathren Laine note on 10/05/2016)  HPI:  Thomas Guerrero is a 76 y.o. male here as a referral from Dr. Laurann Montana for  foot pain. PMHx gout, radiation for prostate cancer, CKD.  He had radiation for his prostate cancer. That's when he started with problems in the feet. He was being treated for gout. Gout helped the joint but not the feet. He saw Dr. Trudie Reed in rheumatology. He was having pain in the back. She sent him to dr. Nelva Bush for his back pain. He was losing his sense of balance. He fell one time but the shots in the back have significantly helped. The inside of his lower left leg feels wet. The pain in the feet are burning. Especially across the toes on the left foot and in the arch. It hurts on the dorsum of the left foot.The whole foot feels bad in the toes  and up to the arch. He can;t wear tight shoes because it hurts. Pain in the feet ois worse at night. His feet get very cold. Hands get cold and fingers cramp. He puts a neuropathy cream on at night. Worse with standing. Better with walking. The cream on the feet help a lot.  The whole foot just started hurting, but when he thinks about it he has had pain on the top of his feet for even longer maybe years. He wore boots for a long time. He worse his shoes tight.   REVIEW OF SYSTEMS: Out of a complete 14 system review of symptoms, the patient complains only of the following symptoms, numbness, tingling, dizziness, and all other reviewed systems are negative.  ALLERGIES: No Known Allergies  HOME MEDICATIONS: Outpatient Medications Prior to Visit  Medication Sig Dispense Refill   allopurinol (ZYLOPRIM) 100 MG tablet Take 100 mg by mouth daily.     amLODipine (NORVASC) 2.5 MG tablet Take 5 mg by mouth daily.     atorvastatin (LIPITOR) 10 MG tablet Take 10 mg by mouth daily.  11   cholecalciferol (VITAMIN D) 1000 units tablet Take 1,000 Units by mouth daily.     Cyanocobalamin (VITAMIN B-12 PO)      famotidine (PEPCID) 10 MG tablet Take 10 mg by mouth daily as needed. Reported on 09/18/2015     ferrous sulfate 325 (65 FE) MG tablet Take 325 mg by mouth daily with breakfast.     gabapentin (NEURONTIN) 300 MG capsule Take 1 capsule (300 mg total) by mouth 3 (three) times daily. 270 capsule 3   lisinopril (ZESTRIL) 20 MG tablet Take 20 mg by mouth daily.     triamcinolone cream (KENALOG) 0.1 % Apply 1 application topically 2 (two) times daily as needed.     No facility-administered medications prior to visit.    PAST MEDICAL HISTORY: Past Medical History:  Diagnosis Date   Chronic kidney disease    GERD (gastroesophageal reflux disease)    High cholesterol    Hx of gout    Hypertension    MGUS (monoclonal gammopathy of unknown significance)    Prostate cancer ([place])     PAST SURGICAL  HISTORY: Past Surgical History:  Procedure Laterality Date   Biopsy of the Prostate     COLONOSCOPY  10/17/2016   MOHS SURGERY  04/2019   forehead    FAMILY HISTORY: Family History  Problem Relation Age of Onset   Cancer Son        leukemia   Neuropathy Neg Hx     SOCIAL HISTORY: Social History   Socioeconomic History   Marital status: Married    Spouse name: Not on file   Number of children: 2   Years  of education: Some college   Highest education level: Not on file  Occupational History   Occupation: Retired  Tobacco Use   Smoking status: Former    Years: 25.00    Types: Cigarettes    Start date: 08/08/1996   Smokeless tobacco: Never  Vaping Use   Vaping Use: Never used  Substance and Sexual Activity   Alcohol use: Not Currently    Alcohol/week: 0.0 standard drinks of alcohol   Drug use: No   Sexual activity: Yes  Other Topics Concern   Not on file  Social History Narrative   Lives at home w/ his wife and daughter   Right-handed   Caffeine: 1 soda per day   Social Determinants of Health   Financial Resource Strain: Not on file  Food Insecurity: Not on file  Transportation Needs: Not on file  Physical Activity: Not on file  Stress: Not on file  Social Connections: Not on file  Intimate Partner Violence: Not on file      PHYSICAL EXAM  There were no vitals filed for this visit.   There is no height or weight on file to calculate BMI.  Generalized: Well developed, in no acute distress  Cardiology: normal rate and rhythm, no murmur noted Respiratory: clear to auscultation bilaterally  Neurological examination  Mentation: Alert oriented to time, place, history taking. Follows all commands speech and language fluent Cranial nerve II-XII: Pupils were equal round reactive to light. Extraocular movements were full, visual field were full  Motor: The motor testing reveals 5 over 5 strength of all 4 extremities. Good symmetric motor tone is noted  throughout.  Sensory: Sensory testing is intact to soft touch on all 4 extremities with exception of mildly decreased sensation bilaterally from mid calf to feet. No evidence of extinction is noted.   Gait and station: Gait is stable without assistive device.   DIAGNOSTIC DATA (LABS, IMAGING, TESTING) - I reviewed patient records, labs, notes, testing and imaging myself where available.      No data to display           Lab Results  Component Value Date   WBC 4.7 08/25/2017   HGB 10.2 (L) 08/25/2017   HCT 31.0 (L) 08/25/2017   MCV 99.9 (H) 08/25/2017   PLT 139 (L) 08/25/2017      Component Value Date/Time   NA 140 08/25/2017 0925   K 4.5 08/25/2017 0925   CL 108 08/25/2017 0925   CO2 22 08/25/2017 0925   GLUCOSE 112 08/25/2017 0925   BUN 37 (H) 08/25/2017 0925   CREATININE 2.92 (H) 08/25/2017 0925   CALCIUM 10.8 (H) 08/25/2017 0925   PROT 7.1 08/25/2017 0925   PROT 6.4 11/24/2016 1304   ALBUMIN 3.9 08/25/2017 0925   AST 21 08/25/2017 0925   ALT 18 08/25/2017 0925   ALKPHOS 74 08/25/2017 0925   BILITOT 0.5 08/25/2017 0925   GFRNONAA 20 (L) 08/25/2017 0925   GFRAA 23 (L) 08/25/2017 0925   No results found for: "CHOL", "HDL", "LDLCALC", "LDLDIRECT", "TRIG", "CHOLHDL" No results found for: "HGBA1C" Lab Results  Component Value Date   VITAMINB12 823 11/24/2016   No results found for: "TSH"     ASSESSMENT AND PLAN 76 y.o. year old male  has a past medical history of Chronic kidney disease, GERD (gastroesophageal reflux disease), High cholesterol, gout, Hypertension, MGUS (monoclonal gammopathy of unknown significance), and Prostate cancer ([place]). here with   No diagnosis found.    Mr Croghan is doing  better. Dizziness has improved and headaches have resolved. He will continue gabapentin 300mg  2-3 times daily. We have discussed using a walking stick or cane for stability. Fall prevention discussed. He will keep an eye on his blood pressure. He was encouraged to stay  active. Eat a healthy, well balanced diet and drink plenty of water. He will follow up with me in 1 year, sooner if needed. He verbalizes understanding and agreement with this plan.    No orders of the defined types were placed in this encounter.     No orders of the defined types were placed in this encounter.     Debbora Presto, FNP-C 07/07/2022, 8:50 AM [facility] [address], [place], [place] [address] [phone]

## 2022-07-11 ENCOUNTER — Ambulatory Visit: Payer: Medicare Other | Admitting: Family Medicine

## 2022-07-11 DIAGNOSIS — G629 Polyneuropathy, unspecified: Secondary | ICD-10-CM

## 2022-07-11 DIAGNOSIS — R519 Headache, unspecified: Secondary | ICD-10-CM

## 2022-07-22 DIAGNOSIS — H2512 Age-related nuclear cataract, left eye: Secondary | ICD-10-CM | POA: Diagnosis not present

## 2022-07-22 DIAGNOSIS — Z9889 Other specified postprocedural states: Secondary | ICD-10-CM | POA: Diagnosis not present

## 2022-07-22 DIAGNOSIS — H353131 Nonexudative age-related macular degeneration, bilateral, early dry stage: Secondary | ICD-10-CM | POA: Diagnosis not present

## 2022-07-22 DIAGNOSIS — H35372 Puckering of macula, left eye: Secondary | ICD-10-CM | POA: Diagnosis not present

## 2022-08-09 ENCOUNTER — Other Ambulatory Visit: Payer: Self-pay | Admitting: Family Medicine

## 2022-08-09 ENCOUNTER — Ambulatory Visit
Admission: RE | Admit: 2022-08-09 | Discharge: 2022-08-09 | Disposition: A | Discharge status: Home / Self Care | Payer: Medicare Other | Source: Ambulatory Visit | Attending: Family Medicine | Admitting: Family Medicine

## 2022-08-09 DIAGNOSIS — R5383 Other fatigue: Secondary | ICD-10-CM

## 2022-08-09 DIAGNOSIS — D472 Monoclonal gammopathy: Secondary | ICD-10-CM | POA: Diagnosis not present

## 2022-08-09 DIAGNOSIS — R059 Cough, unspecified: Secondary | ICD-10-CM | POA: Diagnosis not present

## 2022-08-09 DIAGNOSIS — R0689 Other abnormalities of breathing: Secondary | ICD-10-CM

## 2022-08-09 DIAGNOSIS — I129 Hypertensive chronic kidney disease with stage 1 through stage 4 chronic kidney disease, or unspecified chronic kidney disease: Secondary | ICD-10-CM | POA: Diagnosis not present

## 2022-08-09 DIAGNOSIS — N184 Chronic kidney disease, stage 4 (severe): Secondary | ICD-10-CM | POA: Diagnosis not present

## 2022-08-09 DIAGNOSIS — B349 Viral infection, unspecified: Secondary | ICD-10-CM | POA: Diagnosis not present

## 2022-08-09 DIAGNOSIS — R110 Nausea: Secondary | ICD-10-CM | POA: Diagnosis not present

## 2022-08-13 ENCOUNTER — Other Ambulatory Visit: Payer: Self-pay

## 2022-08-13 ENCOUNTER — Emergency Department (HOSPITAL_COMMUNITY): Payer: Medicare Other

## 2022-08-13 ENCOUNTER — Encounter (HOSPITAL_COMMUNITY): Payer: Self-pay

## 2022-08-13 ENCOUNTER — Inpatient Hospital Stay (HOSPITAL_COMMUNITY)
Admission: EM | Admit: 2022-08-13 | Discharge: 2022-08-18 | DRG: 439 | Disposition: A | Discharge status: Home / Self Care | Payer: Medicare Other | Attending: Internal Medicine | Admitting: Internal Medicine

## 2022-08-13 DIAGNOSIS — Z1152 Encounter for screening for COVID-19: Secondary | ICD-10-CM

## 2022-08-13 DIAGNOSIS — Z79899 Other long term (current) drug therapy: Secondary | ICD-10-CM | POA: Diagnosis not present

## 2022-08-13 DIAGNOSIS — E7800 Pure hypercholesterolemia, unspecified: Secondary | ICD-10-CM | POA: Diagnosis present

## 2022-08-13 DIAGNOSIS — N184 Chronic kidney disease, stage 4 (severe): Secondary | ICD-10-CM | POA: Diagnosis present

## 2022-08-13 DIAGNOSIS — K219 Gastro-esophageal reflux disease without esophagitis: Secondary | ICD-10-CM | POA: Diagnosis present

## 2022-08-13 DIAGNOSIS — Z806 Family history of leukemia: Secondary | ICD-10-CM | POA: Diagnosis not present

## 2022-08-13 DIAGNOSIS — E860 Dehydration: Secondary | ICD-10-CM | POA: Diagnosis present

## 2022-08-13 DIAGNOSIS — C61 Malignant neoplasm of prostate: Secondary | ICD-10-CM | POA: Diagnosis not present

## 2022-08-13 DIAGNOSIS — Z87891 Personal history of nicotine dependence: Secondary | ICD-10-CM | POA: Diagnosis not present

## 2022-08-13 DIAGNOSIS — R197 Diarrhea, unspecified: Secondary | ICD-10-CM | POA: Diagnosis present

## 2022-08-13 DIAGNOSIS — K6389 Other specified diseases of intestine: Secondary | ICD-10-CM | POA: Diagnosis not present

## 2022-08-13 DIAGNOSIS — R112 Nausea with vomiting, unspecified: Secondary | ICD-10-CM | POA: Diagnosis not present

## 2022-08-13 DIAGNOSIS — R519 Headache, unspecified: Secondary | ICD-10-CM | POA: Diagnosis not present

## 2022-08-13 DIAGNOSIS — K3189 Other diseases of stomach and duodenum: Secondary | ICD-10-CM | POA: Diagnosis not present

## 2022-08-13 DIAGNOSIS — N189 Chronic kidney disease, unspecified: Secondary | ICD-10-CM | POA: Diagnosis not present

## 2022-08-13 DIAGNOSIS — J069 Acute upper respiratory infection, unspecified: Secondary | ICD-10-CM | POA: Diagnosis present

## 2022-08-13 DIAGNOSIS — K2980 Duodenitis without bleeding: Secondary | ICD-10-CM | POA: Diagnosis present

## 2022-08-13 DIAGNOSIS — K859 Acute pancreatitis without necrosis or infection, unspecified: Secondary | ICD-10-CM | POA: Diagnosis not present

## 2022-08-13 DIAGNOSIS — I10 Essential (primary) hypertension: Secondary | ICD-10-CM | POA: Diagnosis present

## 2022-08-13 DIAGNOSIS — D472 Monoclonal gammopathy: Secondary | ICD-10-CM | POA: Diagnosis present

## 2022-08-13 DIAGNOSIS — Z8546 Personal history of malignant neoplasm of prostate: Secondary | ICD-10-CM | POA: Diagnosis not present

## 2022-08-13 DIAGNOSIS — I700 Atherosclerosis of aorta: Secondary | ICD-10-CM | POA: Diagnosis not present

## 2022-08-13 DIAGNOSIS — E872 Acidosis, unspecified: Secondary | ICD-10-CM | POA: Diagnosis present

## 2022-08-13 DIAGNOSIS — R109 Unspecified abdominal pain: Secondary | ICD-10-CM | POA: Diagnosis not present

## 2022-08-13 DIAGNOSIS — R93421 Abnormal radiologic findings on diagnostic imaging of right kidney: Secondary | ICD-10-CM | POA: Diagnosis present

## 2022-08-13 DIAGNOSIS — N1832 Chronic kidney disease, stage 3b: Secondary | ICD-10-CM

## 2022-08-13 DIAGNOSIS — I129 Hypertensive chronic kidney disease with stage 1 through stage 4 chronic kidney disease, or unspecified chronic kidney disease: Secondary | ICD-10-CM | POA: Diagnosis not present

## 2022-08-13 LAB — COMPREHENSIVE METABOLIC PANEL
ALT: 13 U/L (ref 0–44)
AST: 20 U/L (ref 15–41)
Albumin: 4.2 g/dL (ref 3.5–5.0)
Alkaline Phosphatase: 64 U/L (ref 38–126)
Anion gap: 11 (ref 5–15)
BUN: 48 mg/dL — ABNORMAL HIGH (ref 8–23)
CO2: 17 mmol/L — ABNORMAL LOW (ref 22–32)
Calcium: 10.1 mg/dL (ref 8.9–10.3)
Chloride: 105 mmol/L (ref 98–111)
Creatinine, Ser: 2.06 mg/dL — ABNORMAL HIGH (ref 0.61–1.24)
GFR, Estimated: 33 mL/min — ABNORMAL LOW (ref >60)
Glucose, Bld: 106 mg/dL — ABNORMAL HIGH (ref 70–99)
Potassium: 4.7 mmol/L (ref 3.5–5.1)
Sodium: 133 mmol/L — ABNORMAL LOW (ref 135–145)
Total Bilirubin: 1.3 mg/dL — ABNORMAL HIGH (ref 0.3–1.2)
Total Protein: 8.1 g/dL (ref 6.5–8.1)

## 2022-08-13 LAB — CBC
HCT: 36.4 % — ABNORMAL LOW (ref 39.0–52.0)
Hemoglobin: 12.2 g/dL — ABNORMAL LOW (ref 13.0–17.0)
MCH: 33.8 pg (ref 26.0–34.0)
MCHC: 33.5 g/dL (ref 30.0–36.0)
MCV: 100.8 fL — ABNORMAL HIGH (ref 80.0–100.0)
Platelets: 246 10*3/uL (ref 150–400)
RBC: 3.61 MIL/uL — ABNORMAL LOW (ref 4.22–5.81)
RDW: 12.4 % (ref 11.5–15.5)
WBC: 11.7 10*3/uL — ABNORMAL HIGH (ref 4.0–10.5)
nRBC: 0 % (ref 0.0–0.2)

## 2022-08-13 LAB — RESP PANEL BY RT-PCR (RSV, FLU A&B, COVID)  RVPGX2
Influenza A by PCR: NEGATIVE
Influenza B by PCR: NEGATIVE
Resp Syncytial Virus by PCR: NEGATIVE
SARS Coronavirus 2 by RT PCR: NEGATIVE

## 2022-08-13 LAB — LIPASE, BLOOD: Lipase: 98 U/L — ABNORMAL HIGH (ref 11–51)

## 2022-08-13 MED ORDER — LACTATED RINGERS IV BOLUS
1000.0000 mL | Freq: Once | INTRAVENOUS | Status: AC
  Administered 2022-08-13: 1000 mL via INTRAVENOUS

## 2022-08-13 NOTE — ED Triage Notes (Signed)
Pt. BIB [facility] for malaise. Pt. Was diagnosed with pneumonia 2 weeks ago per EMS pt. Has not been taking meds. Pt. Has had decreased appetite, nausea and vomiting as well. Pt. CAOx4, VSS with EMS.

## 2022-08-13 NOTE — ED Provider Notes (Signed)
[facility] DEPT Provider Note   CSN: [id number] Arrival date & time: 08/13/22  2019     History  Chief Complaint  Patient presents with   Nausea   Diarrhea    Thomas Guerrero is a 77 y.o. male.  Pt indicates has generally not felt well for past two weeks. States has developed non prod cough, congestion/runny nose, nausea, decreased po intake, and in past few days loose/diarrhea stools. Indicates went to walk-in clinic a few days ago, and was given rx zitrhomax for possible pna. Intermittent dull/diffuse headaches. No acute, abrupt or severe head pain. No neck pain/stiffness. No chest pain or discomfort. No sob. Abd crampy pain, diffuse. No constant/localized abd pain.  No dysuria. No extremity pain or swelling. No specific known ill contacts.   The history is provided by the patient, medical records and the EMS personnel.       Home Medications Prior to Admission medications   Medication Sig Start Date End Date Taking? Authorizing Provider  allopurinol (ZYLOPRIM) 100 MG tablet Take 100 mg by mouth daily. 07/24/19   [provider]  amLODipine (NORVASC) 2.5 MG tablet Take 5 mg by mouth daily. 05/27/21   [provider]  atorvastatin (LIPITOR) 10 MG tablet Take 10 mg by mouth daily. 09/19/16   [provider]  cholecalciferol (VITAMIN D) 1000 units tablet Take 1,000 Units by mouth daily.    [provider]  Cyanocobalamin (VITAMIN B-12 PO)     [provider]  famotidine (PEPCID) 10 MG tablet Take 10 mg by mouth daily as needed. Reported on 09/18/2015    [provider]  ferrous sulfate 325 (65 FE) MG tablet Take 325 mg by mouth daily with breakfast.    [provider]  gabapentin (NEURONTIN) 300 MG capsule Take 1 capsule (300 mg total) by mouth 3 (three) times daily. 07/05/21   Lomax, Amy, NP  lisinopril (ZESTRIL) 20 MG tablet Take 20 mg by mouth daily. 06/09/21   [provider]   triamcinolone cream (KENALOG) 0.1 % Apply 1 application topically 2 (two) times daily as needed. 04/18/16   [provider]      Allergies    Patient has no known allergies.    Review of Systems   Review of Systems  Constitutional:  Negative for chills and fever.  HENT:  Positive for congestion and rhinorrhea. Negative for sore throat.   Eyes:  Negative for redness.  Respiratory:  Positive for cough. Negative for shortness of breath.   Cardiovascular:  Negative for chest pain and leg swelling.  Gastrointestinal:  Positive for abdominal pain, diarrhea and nausea.  Genitourinary:  Negative for dysuria and flank pain.  Musculoskeletal:  Negative for back pain and neck pain.  Skin:  Negative for rash.  Neurological:  Positive for headaches.  Hematological:  Does not bruise/bleed easily.  Psychiatric/Behavioral:  Negative for confusion.     Physical Exam Updated Vital Signs BP (!) 156/67   Pulse 65   Temp 97.9 F (36.6 C)   Resp 19   SpO2 99%  Physical Exam Vitals and nursing note reviewed.  Constitutional:      Appearance: Normal appearance. He is well-developed.  HENT:     Head: Atraumatic.     Nose: Nose normal.     Mouth/Throat:     Mouth: Mucous membranes are moist.     Pharynx: Oropharynx is clear.  Eyes:     General: No scleral icterus.    Conjunctiva/sclera: Conjunctivae  normal.     Pupils: Pupils are equal, round, and reactive to light.  Neck:     Trachea: No tracheal deviation.     Comments: No stiffness or rigidity.  Cardiovascular:     Rate and Rhythm: Normal rate and regular rhythm.     Pulses: Normal pulses.     Heart sounds: Normal heart sounds. No murmur heard.    No friction rub. No gallop.  Pulmonary:     Effort: Pulmonary effort is normal. No accessory muscle usage or respiratory distress.     Breath sounds: Normal breath sounds.  Abdominal:     General: Bowel sounds are normal. There is no distension.     Palpations: Abdomen is soft.  There is no mass.     Tenderness: There is no abdominal tenderness. There is no guarding.  Genitourinary:    Comments: No cva tenderness. Musculoskeletal:        General: No swelling or tenderness.     Cervical back: Normal range of motion and neck supple. No rigidity.     Right lower leg: No edema.     Left lower leg: No edema.  Lymphadenopathy:     Cervical: No cervical adenopathy.  Skin:    General: Skin is warm and dry.     Findings: No rash.  Neurological:     Mental Status: He is alert.     Comments: Alert, speech clear. Motor/sens grossly intact bil.   Psychiatric:        Mood and Affect: Mood normal.     ED Results / Procedures / Treatments   Labs (all labs ordered are listed, but only abnormal results are displayed) Results for orders placed or performed during the hospital encounter of 08/13/22  Resp panel by RT-PCR (RSV, Flu A&B, Covid) Anterior Nasal Swab   Specimen: Anterior Nasal Swab  Result Value Ref Range   SARS Coronavirus 2 by RT PCR NEGATIVE NEGATIVE   Influenza A by PCR NEGATIVE NEGATIVE   Influenza B by PCR NEGATIVE NEGATIVE   Resp Syncytial Virus by PCR NEGATIVE NEGATIVE  CBC  Result Value Ref Range   WBC 11.7 (H) 4.0 - 10.5 K/uL   RBC 3.61 (L) 4.22 - 5.81 MIL/uL   Hemoglobin 12.2 (L) 13.0 - 17.0 g/dL   HCT 14.1 (L) 59.7 - 33.1 %   MCV 100.8 (H) 80.0 - 100.0 fL   MCH 33.8 26.0 - 34.0 pg   MCHC 33.5 30.0 - 36.0 g/dL   RDW 25.0 87.1 - 99.4 %   Platelets 246 150 - 400 K/uL   nRBC 0.0 0.0 - 0.2 %  Comprehensive metabolic panel  Result Value Ref Range   Sodium 133 (L) 135 - 145 mmol/L   Potassium 4.7 3.5 - 5.1 mmol/L   Chloride 105 98 - 111 mmol/L   CO2 17 (L) 22 - 32 mmol/L   Glucose, Bld 106 (H) 70 - 99 mg/dL   BUN 48 (H) 8 - 23 mg/dL   Creatinine, Ser 1.29 (H) 0.61 - 1.24 mg/dL   Calcium 04.7 8.9 - 53.3 mg/dL   Total Protein 8.1 6.5 - 8.1 g/dL   Albumin 4.2 3.5 - 5.0 g/dL   AST 20 15 - 41 U/L   ALT 13 0 - 44 U/L   Alkaline Phosphatase 64  38 - 126 U/L   Total Bilirubin 1.3 (H) 0.3 - 1.2 mg/dL   GFR, Estimated 33 (L) >60 mL/min   Anion gap 11 5 - 15  Lipase, blood  Result Value Ref Range   Lipase 98 (H) 11 - 51 U/L   DG Chest Port 1 View  Result Date: 08/13/2022 CLINICAL DATA:  Decreased appetite with nausea and vomiting. EXAM: PORTABLE CHEST 1 VIEW COMPARISON:  August 09, 2022 FINDINGS: The heart size and mediastinal contours are within normal limits. Both lungs are clear. The visualized skeletal structures are unremarkable. IMPRESSION: No active disease. Electronically Signed   By: Virgina Norfolk M.D.   On: 08/13/2022 21:00   DG Chest 2 View  Result Date: 08/09/2022 CLINICAL DATA:  Cord breast sounds on the left, fatigue EXAM: CHEST - 2 VIEW COMPARISON:  None Available. FINDINGS: Cardiac and mediastinal contours are within normal limits. Small area of heterogeneous opacity in the left lower lung. No focal pulmonary opacity in the right lung. No pleural effusion or pneumothorax. No acute osseous abnormality. IMPRESSION: Small area of heterogeneous opacity in the left lower lung, which may represent atelectasis or infection. Recommend follow-up chest radiograph in 4-6 weeks to ensure resolution. Electronically Signed   By: Merilyn Baba M.D.   On: 08/09/2022 11:34     EKG EKG Interpretation  Date/Time:  Saturday August 13 2022 20:47:17 EST Ventricular Rate:  69 PR Interval:  196 QRS Duration: 106 QT Interval:  392 QTC Calculation: 420 R Axis:   9 Text Interpretation: Sinus rhythm Baseline wander No previous tracing Confirmed by Lajean Saver [phone]) on 08/13/2022 9:00:30 PM  Radiology DG Chest Port 1 View  Result Date: 08/13/2022 CLINICAL DATA:  Decreased appetite with nausea and vomiting. EXAM: PORTABLE CHEST 1 VIEW COMPARISON:  August 09, 2022 FINDINGS: The heart size and mediastinal contours are within normal limits. Both lungs are clear. The visualized skeletal structures are unremarkable. IMPRESSION: No active  disease. Electronically Signed   By: Virgina Norfolk M.D.   On: 08/13/2022 21:00    Procedures Procedures    Medications Ordered in ED Medications  lactated ringers bolus 1,000 mL (1,000 mLs Intravenous New Bag/Given 08/13/22 2109)    ED Course/ Medical Decision Making/ A&P                           Medical Decision Making Problems Addressed: Acute diarrhea: acute illness or injury with systemic symptoms Dehydration: acute illness or injury with systemic symptoms that poses a threat to life or bodily functions Nausea and vomiting in adult: acute illness or injury with systemic symptoms Stage 3b chronic kidney disease ([place]): chronic illness or injury with exacerbation, progression, or side effects of treatment that poses a threat to life or bodily functions  Amount and/or Complexity of Data Reviewed Independent Historian: EMS    Details: hx External Data Reviewed: notes. Labs: ordered. Decision-making details documented in ED Course. Radiology: ordered and independent interpretation performed. Decision-making details documented in ED Course. ECG/medicine tests: ordered and independent interpretation performed. Decision-making details documented in ED Course.  Risk Prescription drug management. Decision regarding hospitalization.   Iv ns. Continuous pulse ox and cardiac monitoring. Labs ordered/sent. Imaging ordered.   Differential diagnosis includes  . Dispo decision including potential need for admission considered - will get labs and imaging and reassess.   Reviewed nursing notes and prior charts for additional history. External reports reviewed. Additional history from: EMS.  Cardiac monitor: sinus rhythm, rate 70.  Labs reviewed/interpreted by me - ckd, cr c/w prior. Hco3 mildly low, ?dehydration, ivf. Wbc sl elevated. Covid and flu neg. Lipase mildly elevated.   Xrays reviewed/interpreted  by me - no pna.   2305 cts pending, signed out to Dr Florina Ou to check cts,  recheck pt, and dispo appropriately.            Final Clinical Impression(s) / ED Diagnoses Final diagnoses:  Nausea and vomiting in adult  Acute diarrhea  Dehydration  Stage 3b chronic kidney disease [facility])    Rx / DC Orders ED Discharge Orders     None         Lajean Saver, MD 08/13/22 2308

## 2022-08-13 NOTE — ED Provider Notes (Signed)
Nursing notes and vitals signs, including pulse oximetry, reviewed.  Summary of this visit's results, reviewed by myself:  EKG:  EKG Interpretation  Date/Time:  Saturday August 13 2022 20:47:17 EST Ventricular Rate:  69 PR Interval:  196 QRS Duration: 106 QT Interval:  392 QTC Calculation: 420 R Axis:   9 Text Interpretation: Sinus rhythm Baseline wander No previous tracing Confirmed by Lajean Saver [phone]) on 08/13/2022 9:00:30 PM        Labs:  Results for orders placed or performed during the hospital encounter of 08/13/22 (from the past 24 hour(s))  Resp panel by RT-PCR (RSV, Flu A&B, Covid) Anterior Nasal Swab     Status: None   Collection Time: 08/13/22  8:37 PM   Specimen: Anterior Nasal Swab  Result Value Ref Range   SARS Coronavirus 2 by RT PCR NEGATIVE NEGATIVE   Influenza A by PCR NEGATIVE NEGATIVE   Influenza B by PCR NEGATIVE NEGATIVE   Resp Syncytial Virus by PCR NEGATIVE NEGATIVE  CBC     Status: Abnormal   Collection Time: 08/13/22  9:09 PM  Result Value Ref Range   WBC 11.7 (H) 4.0 - 10.5 K/uL   RBC 3.61 (L) 4.22 - 5.81 MIL/uL   Hemoglobin 12.2 (L) 13.0 - 17.0 g/dL   HCT 36.4 (L) 39.0 - 52.0 %   MCV 100.8 (H) 80.0 - 100.0 fL   MCH 33.8 26.0 - 34.0 pg   MCHC 33.5 30.0 - 36.0 g/dL   RDW 12.4 11.5 - 15.5 %   Platelets 246 150 - 400 K/uL   nRBC 0.0 0.0 - 0.2 %  Comprehensive metabolic panel     Status: Abnormal   Collection Time: 08/13/22  9:09 PM  Result Value Ref Range   Sodium 133 (L) 135 - 145 mmol/L   Potassium 4.7 3.5 - 5.1 mmol/L   Chloride 105 98 - 111 mmol/L   CO2 17 (L) 22 - 32 mmol/L   Glucose, Bld 106 (H) 70 - 99 mg/dL   BUN 48 (H) 8 - 23 mg/dL   Creatinine, Ser 2.06 (H) 0.61 - 1.24 mg/dL   Calcium 10.1 8.9 - 10.3 mg/dL   Total Protein 8.1 6.5 - 8.1 g/dL   Albumin 4.2 3.5 - 5.0 g/dL   AST 20 15 - 41 U/L   ALT 13 0 - 44 U/L   Alkaline Phosphatase 64 38 - 126 U/L   Total Bilirubin 1.3 (H) 0.3 - 1.2 mg/dL   GFR, Estimated 33 (L) >60  mL/min   Anion gap 11 5 - 15  Lipase, blood     Status: Abnormal   Collection Time: 08/13/22  9:09 PM  Result Value Ref Range   Lipase 98 (H) 11 - 51 U/L    Imaging Studies: CT ABDOMEN PELVIS WO CONTRAST  Result Date: 08/13/2022 CLINICAL DATA:  Acute abdominal pain.  Nausea and vomiting. EXAM: CT ABDOMEN AND PELVIS WITHOUT CONTRAST TECHNIQUE: Multidetector CT imaging of the abdomen and pelvis was performed following the standard protocol without IV contrast. RADIATION DOSE REDUCTION: This exam was performed according to the departmental dose-optimization program which includes automated exposure control, adjustment of the mA and/or kV according to patient size and/or use of iterative reconstruction technique. COMPARISON:  CT pelvis 09/22/2015 and MR abdomen 08/27/2018 FINDINGS: Lower chest: No acute abnormality. Hepatobiliary: No focal liver abnormality is seen. No gallstones, gallbladder wall thickening, or biliary dilatation. Pancreas: Mild stranding about the descending duodenum/pancreatic head. No ductal dilation. Spleen: Normal in size without  focal abnormality. Adrenals/Urinary Tract: Normal adrenal glands. Bilateral cortical renal scarring. No urinary calculi or hydronephrosis. Unremarkable bladder. Stomach/Bowel: Normal caliber large and small bowel. Colonic diverticulosis without diverticulitis. Normal appendix. Mild wall thickening and adjacent stranding about the descending duodenum (circa series 2/image 31). Vascular/Lymphatic: Aortic atherosclerosis. 1.0 cm precaval node (2/29). Reproductive: Metallic densities in the prostate. Other: Fat containing bilateral inguinal hernias. No free intraperitoneal air. Musculoskeletal: No acute osseous abnormality. IMPRESSION: Mild stranding about the pancreatic head and descending duodenum. 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 Recommend correlation with lipase. No ductal dilation. Follow-up is recommended to ensure resolution and  exclude underlying mass. 1.0 center meter precaval node is favored reactive. Continued attention on follow-up. Aortic Atherosclerosis (ICD10-I70.0). Electronically Signed   By: Placido Sou M.D.   On: 08/13/2022 23:21   CT Head Wo Contrast  Result Date: 08/13/2022 CLINICAL DATA:  Headache, new onset. Malaise, decreased appetite, nausea and vomiting. EXAM: CT HEAD WITHOUT CONTRAST TECHNIQUE: Contiguous axial images were obtained from the base of the skull through the vertex without intravenous contrast. RADIATION DOSE REDUCTION: This exam was performed according to the departmental dose-optimization program which includes automated exposure control, adjustment of the mA and/or kV according to patient size and/or use of iterative reconstruction technique. COMPARISON:  12/25/2020. FINDINGS: Brain: No acute intracranial hemorrhage, midline shift or mass effect. No extra-axial fluid collection. Gray-white matter differentiation is within normal limits. No hydrocephalus. Vascular: No hyperdense vessel or unexpected calcification. Skull: Normal. Negative for fracture or focal lesion. Sinuses/Orbits: Mild debris in the maxillary sinuses and ethmoid air cells. No acute orbital abnormality. Other: None. IMPRESSION: No acute intracranial process. Electronically Signed   By: Brett Fairy M.D.   On: 08/13/2022 23:20   DG Chest Port 1 View  Result Date: 08/13/2022 CLINICAL DATA:  Decreased appetite with nausea and vomiting. EXAM: PORTABLE CHEST 1 VIEW COMPARISON:  August 09, 2022 FINDINGS: The heart size and mediastinal contours are within normal limits. Both lungs are clear. The visualized skeletal structures are unremarkable. IMPRESSION: No active disease. Electronically Signed   By: Virgina Norfolk M.D.   On: 08/13/2022 21:00    Patient tells me he has not been able to eat or drink anything for the past 2 weeks.  He has had persistent diarrhea and retching.  He was given a Z-Pak for suspected pneumonia (chest  x-ray today shows no pneumonia) but was unable to hold down the Zithromax.  He is tender in the epigastrium and has had pain in the epigastrium since his illness began 2 weeks ago.  CT shows evidence of pancreatitis and his lipase is elevated.  We will have him admitted.  12:24 AM Dr. Alcario Drought to admit.     Aamori Mcmasters, Jenny Reichmann, MD 08/14/22 [phone]

## 2022-08-13 NOTE — Discharge Instructions (Addendum)
It was our pleasure to provide your ER care today - we hope that you feel better.  Drink plenty of fluids/stay well hydrated.   Follow up closely with primary care doctor in the next 2-3 days if symptoms fail to improve/resolve.  Return to ER if worse, new symptoms, fevers, new or worsening or severe abdominal pain, chest pain, trouble breathing, or other concern.

## 2022-08-14 ENCOUNTER — Inpatient Hospital Stay (HOSPITAL_COMMUNITY): Payer: Medicare Other

## 2022-08-14 DIAGNOSIS — C61 Malignant neoplasm of prostate: Secondary | ICD-10-CM

## 2022-08-14 DIAGNOSIS — N184 Chronic kidney disease, stage 4 (severe): Secondary | ICD-10-CM | POA: Diagnosis not present

## 2022-08-14 DIAGNOSIS — E860 Dehydration: Secondary | ICD-10-CM | POA: Diagnosis present

## 2022-08-14 DIAGNOSIS — I10 Essential (primary) hypertension: Secondary | ICD-10-CM | POA: Diagnosis present

## 2022-08-14 DIAGNOSIS — Z806 Family history of leukemia: Secondary | ICD-10-CM | POA: Diagnosis not present

## 2022-08-14 DIAGNOSIS — E872 Acidosis, unspecified: Secondary | ICD-10-CM | POA: Diagnosis not present

## 2022-08-14 DIAGNOSIS — K3189 Other diseases of stomach and duodenum: Secondary | ICD-10-CM | POA: Diagnosis not present

## 2022-08-14 DIAGNOSIS — K6389 Other specified diseases of intestine: Secondary | ICD-10-CM | POA: Diagnosis not present

## 2022-08-14 DIAGNOSIS — I129 Hypertensive chronic kidney disease with stage 1 through stage 4 chronic kidney disease, or unspecified chronic kidney disease: Secondary | ICD-10-CM | POA: Diagnosis present

## 2022-08-14 DIAGNOSIS — Z8546 Personal history of malignant neoplasm of prostate: Secondary | ICD-10-CM | POA: Diagnosis not present

## 2022-08-14 DIAGNOSIS — E7800 Pure hypercholesterolemia, unspecified: Secondary | ICD-10-CM | POA: Diagnosis present

## 2022-08-14 DIAGNOSIS — J069 Acute upper respiratory infection, unspecified: Secondary | ICD-10-CM | POA: Diagnosis present

## 2022-08-14 DIAGNOSIS — R93421 Abnormal radiologic findings on diagnostic imaging of right kidney: Secondary | ICD-10-CM | POA: Diagnosis present

## 2022-08-14 DIAGNOSIS — D472 Monoclonal gammopathy: Secondary | ICD-10-CM | POA: Diagnosis present

## 2022-08-14 DIAGNOSIS — Z87891 Personal history of nicotine dependence: Secondary | ICD-10-CM | POA: Diagnosis not present

## 2022-08-14 DIAGNOSIS — Z79899 Other long term (current) drug therapy: Secondary | ICD-10-CM | POA: Diagnosis not present

## 2022-08-14 DIAGNOSIS — K859 Acute pancreatitis without necrosis or infection, unspecified: Secondary | ICD-10-CM | POA: Diagnosis present

## 2022-08-14 DIAGNOSIS — K2980 Duodenitis without bleeding: Secondary | ICD-10-CM | POA: Diagnosis present

## 2022-08-14 DIAGNOSIS — R197 Diarrhea, unspecified: Secondary | ICD-10-CM | POA: Diagnosis present

## 2022-08-14 DIAGNOSIS — K219 Gastro-esophageal reflux disease without esophagitis: Secondary | ICD-10-CM | POA: Diagnosis present

## 2022-08-14 DIAGNOSIS — Z1152 Encounter for screening for COVID-19: Secondary | ICD-10-CM | POA: Diagnosis not present

## 2022-08-14 LAB — CBC
HCT: 30.8 % — ABNORMAL LOW (ref 39.0–52.0)
Hemoglobin: 10.3 g/dL — ABNORMAL LOW (ref 13.0–17.0)
MCH: 34.2 pg — ABNORMAL HIGH (ref 26.0–34.0)
MCHC: 33.4 g/dL (ref 30.0–36.0)
MCV: 102.3 fL — ABNORMAL HIGH (ref 80.0–100.0)
Platelets: 177 10*3/uL (ref 150–400)
RBC: 3.01 MIL/uL — ABNORMAL LOW (ref 4.22–5.81)
RDW: 12.8 % (ref 11.5–15.5)
WBC: 9.3 10*3/uL (ref 4.0–10.5)
nRBC: 0 % (ref 0.0–0.2)

## 2022-08-14 LAB — COMPREHENSIVE METABOLIC PANEL
ALT: 11 U/L (ref 0–44)
AST: 13 U/L — ABNORMAL LOW (ref 15–41)
Albumin: 3 g/dL — ABNORMAL LOW (ref 3.5–5.0)
Alkaline Phosphatase: 52 U/L (ref 38–126)
Anion gap: 8 (ref 5–15)
BUN: 42 mg/dL — ABNORMAL HIGH (ref 8–23)
CO2: 19 mmol/L — ABNORMAL LOW (ref 22–32)
Calcium: 9.3 mg/dL (ref 8.9–10.3)
Chloride: 107 mmol/L (ref 98–111)
Creatinine, Ser: 1.89 mg/dL — ABNORMAL HIGH (ref 0.61–1.24)
GFR, Estimated: 36 mL/min — ABNORMAL LOW (ref >60)
Glucose, Bld: 99 mg/dL (ref 70–99)
Potassium: 4.3 mmol/L (ref 3.5–5.1)
Sodium: 134 mmol/L — ABNORMAL LOW (ref 135–145)
Total Bilirubin: 0.8 mg/dL (ref 0.3–1.2)
Total Protein: 6.1 g/dL — ABNORMAL LOW (ref 6.5–8.1)

## 2022-08-14 LAB — TRIGLYCERIDES: Triglycerides: 221 mg/dL — ABNORMAL HIGH (ref <150)

## 2022-08-14 MED ORDER — FERROUS SULFATE 325 (65 FE) MG PO TABS
325.0000 mg | ORAL_TABLET | Freq: Every day | ORAL | Status: DC
  Administered 2022-08-14 – 2022-08-18 (×5): 325 mg via ORAL
  Filled 2022-08-14 (×5): qty 1

## 2022-08-14 MED ORDER — GABAPENTIN 300 MG PO CAPS
300.0000 mg | ORAL_CAPSULE | Freq: Two times a day (BID) | ORAL | Status: DC
  Administered 2022-08-14 – 2022-08-18 (×9): 300 mg via ORAL
  Filled 2022-08-14 (×9): qty 1

## 2022-08-14 MED ORDER — AMLODIPINE BESYLATE 5 MG PO TABS
5.0000 mg | ORAL_TABLET | Freq: Every day | ORAL | Status: DC
  Administered 2022-08-14 – 2022-08-18 (×5): 5 mg via ORAL
  Filled 2022-08-14 (×5): qty 1

## 2022-08-14 MED ORDER — ACETAMINOPHEN 325 MG PO TABS
650.0000 mg | ORAL_TABLET | Freq: Four times a day (QID) | ORAL | Status: DC | PRN
  Administered 2022-08-14 – 2022-08-15 (×2): 650 mg via ORAL
  Filled 2022-08-14 (×2): qty 2

## 2022-08-14 MED ORDER — ACETAMINOPHEN 650 MG RE SUPP: 650.0000 mg | Freq: Four times a day (QID) | RECTAL | Status: DC | PRN

## 2022-08-14 MED ORDER — ENOXAPARIN SODIUM 40 MG/0.4ML IJ SOSY
40.0000 mg | PREFILLED_SYRINGE | INTRAMUSCULAR | Status: DC
  Administered 2022-08-14 – 2022-08-18 (×5): 40 mg via SUBCUTANEOUS
  Filled 2022-08-14 (×5): qty 0.4

## 2022-08-14 MED ORDER — ATORVASTATIN CALCIUM 10 MG PO TABS: 10.0000 mg | ORAL_TABLET | Freq: Every day | ORAL | Status: DC

## 2022-08-14 MED ORDER — ALLOPURINOL 100 MG PO TABS
100.0000 mg | ORAL_TABLET | Freq: Every day | ORAL | Status: DC
  Administered 2022-08-14 – 2022-08-18 (×5): 100 mg via ORAL
  Filled 2022-08-14 (×5): qty 1

## 2022-08-14 MED ORDER — ONDANSETRON HCL 4 MG/2ML IJ SOLN: 4.0000 mg | Freq: Four times a day (QID) | INTRAMUSCULAR | Status: DC | PRN

## 2022-08-14 MED ORDER — LACTATED RINGERS IV SOLN: INTRAVENOUS | Status: DC

## 2022-08-14 MED ORDER — ONDANSETRON HCL 4 MG PO TABS: 4.0000 mg | ORAL_TABLET | Freq: Four times a day (QID) | ORAL | Status: DC | PRN

## 2022-08-14 MED ORDER — FAMOTIDINE 20 MG PO TABS: 10.0000 mg | ORAL_TABLET | Freq: Every day | ORAL | Status: DC | PRN

## 2022-08-14 NOTE — Assessment & Plan Note (Addendum)
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 1) strict intake and output 2) BMP daily 3) IVF 4) hold lisinopril

## 2022-08-14 NOTE — Assessment & Plan Note (Signed)
Looks like he's following with Dr. Alinda Money for this.

## 2022-08-14 NOTE — Progress Notes (Signed)
Thomas Guerrero is a 77 y.o. male with medical history significant of CKD 4, MGUS, HTN, prostate CA, HLD, HTN. Pt with onset of URI symptoms ~2 weeks ago.  Non-productive cough, runny nose, congestion, nausea, decreased PO intake. Over past few days developed diarrhea, and crampy epigastric abd pain.  Patient was admitted after midnight and seen and evaluated at bedside.  He was admitted for acute pancreatitis with ongoing GI symptoms with findings of inflammation around the head of the pancreas as well as lipase elevation.  He remains n.p.o. and on aggressive IV fluid.  Respiratory viral panel pending as it is suspected that there may be a viral involvement as noted on H&P.  MRCP ordered for further evaluation and workup which is currently pending.  Continue n.p.o. status and aggressive IV fluid for now and await further results of MRCP.  A.m. labs ordered to follow-up.  Total care time: 25 minutes.

## 2022-08-14 NOTE — Assessment & Plan Note (Addendum)
Pancreatitis given lipase of 98 and ? Of inflammation around head of pancreas, and epigastric TTP.  Though pancreatitis wouldn't explain his URI symptoms by itself. DDx includes gastroenteritis  Though viral gastroenteritis doesn't usually last 2 weeks. Check C.Diff Check GI pathogen panel IVF NPO Zofran PRN Pancreatitis work up: Check triglycerides Hold lisinopril (can cause drug induced pancreatitis) Given the way the CT reads, I think ill just order MRCP on the patient to get better look at pancreas and duct. Check RVP Check Coxsackie B virus AB panel (apparently can cause URI symptoms, GI symptoms, and acute pancreatitis). URI and pancreatitis could also be completely unrelated, but I just find it slightly odd that patient is one of 2 patients I am seeing tonight with nearly identical presentation of URI symptoms just before new year followed by GI symptoms and acute pancreatitis. No EtOH use No gallstones on CT, will see if MRCP shows anything

## 2022-08-14 NOTE — H&P (Signed)
History and Physical    Patient: Thomas Guerrero WIO:[id number] DOB: [birth year] DOA: 08/13/2022 DOS: the patient was seen and examined on 08/14/2022 PCP: [name], MD  Patient coming from: Home  Chief Complaint:  Chief Complaint  Patient presents with   Nausea   Diarrhea   HPI: Thomas Guerrero is a 77 y.o. male with medical history significant of CKD 4, MGUS, HTN, prostate CA, HLD, HTN.  Pt with onset of URI symptoms ~2 weeks ago.  Non-productive cough, runny nose, congestion, nausea, decreased PO intake.  Over past few days developed diarrhea, and crampy epigastric abd pain.  Given z pak by walk in clinic a few days ago for possible PNA.  Has intermittent dull headaches.  No neck stiffness, no CP, no SOB, no dysuria.  Review of Systems: As mentioned in the history of present illness. All other systems reviewed and are negative. Past Medical History:  Diagnosis Date   Chronic kidney disease    GERD (gastroesophageal reflux disease)    High cholesterol    Hx of gout    Hypertension    MGUS (monoclonal gammopathy of unknown significance)    Prostate cancer [facility])    Past Surgical History:  Procedure Laterality Date   Biopsy of the Prostate     COLONOSCOPY  10/17/2016   MOHS SURGERY  04/2019   forehead   Social History:  reports that he has quit smoking. His smoking use included cigarettes. He started smoking about 26 years ago. He has never used smokeless tobacco. He reports that he does not currently use alcohol. He reports that he does not use drugs.  No Known Allergies  Family History  Problem Relation Age of Onset   Cancer Son        leukemia   Neuropathy Neg Hx     Prior to Admission medications   Medication Sig Start Date End Date Taking? Authorizing Provider  allopurinol (ZYLOPRIM) 100 MG tablet Take 100 mg by mouth daily. 07/24/19  Yes [provider]  amLODipine (NORVASC) 2.5 MG tablet Take 5 mg by mouth daily. 05/27/21  Yes [provider]  atorvastatin (LIPITOR) 10 MG tablet Take 10 mg by mouth daily. 09/19/16  Yes [provider]  azithromycin (ZITHROMAX) 250 MG tablet Take 250-500 mg by mouth as directed. Take 2 tablets on Day 1 Then Take 1 tablet on Days 2-5 08/11/22  Yes [provider]  cholecalciferol (VITAMIN D) 1000 units tablet Take 1,000 Units by mouth daily.   Yes [provider]  Cyanocobalamin (VITAMIN B-12 PO)    Yes [provider]  famotidine (PEPCID) 10 MG tablet Take 10 mg by mouth daily as needed for heartburn or indigestion. Reported on 09/18/2015   Yes [provider]  ferrous sulfate 325 (65 FE) MG tablet Take 325 mg by mouth daily with breakfast.   Yes [provider]  gabapentin (NEURONTIN) 300 MG capsule Take 1 capsule (300 mg total) by mouth 3 (three) times daily. Patient taking differently: Take 300 mg by mouth 2 (two) times daily. 07/05/21  Yes Lomax, Amy, NP  lisinopril (ZESTRIL) 20 MG tablet Take 20 mg by mouth daily. 06/09/21  Yes [provider]  ondansetron (ZOFRAN) 4 MG tablet Take 4 mg by mouth every 8 (eight) hours as needed for nausea or vomiting. 08/11/22  Yes [provider]  triamcinolone cream (KENALOG) 0.1 % Apply 1 application  topically 2 (two) times daily as needed (rash). 04/18/16  Yes [provider]  Physical Exam: Vitals:   08/13/22 2045 08/13/22 2220 08/13/22 2230  BP: (!) 167/74 (!) 156/67 (!) 169/70  Pulse: 71 65 69  Resp: $Remo'16 19 20  'RYpyi$ Temp: 97.9 F (36.6 C)    SpO2: 100% 99% 97%   Constitutional: NAD, calm, comfortable Eyes: PERRL, lids and conjunctivae normal ENMT: Mucous membranes are moist. Posterior pharynx clear of any exudate or lesions.Normal dentition.  Neck: normal, supple, no masses, no thyromegaly Respiratory: clear to auscultation bilaterally, no wheezing, no crackles. Normal respiratory effort. No accessory muscle use.  Cardiovascular: Regular rate and rhythm, no murmurs  / rubs / gallops. No extremity edema. 2+ pedal pulses. No carotid bruits.  Abdomen: Epigastric TTP Musculoskeletal: no clubbing / cyanosis. No joint deformity upper and lower extremities. Good ROM, no contractures. Normal muscle tone.  Skin: no rashes, lesions, ulcers. No induration Neurologic: CN 2-12 grossly intact. Sensation intact, DTR normal. Strength 5/5 in all 4.  Psychiatric: Normal judgment and insight. Alert and oriented x 3. Normal mood.   Data Reviewed:    Lipase 98     Latest Ref Rng & Units 08/13/2022    9:09 PM 08/25/2017    9:25 AM 11/24/2016    1:04 PM  CMP  Glucose 70 - 99 mg/dL 106  112    BUN 8 - 23 mg/dL 48  37    Creatinine 0.61 - 1.24 mg/dL 2.06  2.92    Sodium 135 - 145 mmol/L 133  140    Potassium 3.5 - 5.1 mmol/L 4.7  4.5    Chloride 98 - 111 mmol/L 105  108    CO2 22 - 32 mmol/L 17  22    Calcium 8.9 - 10.3 mg/dL 10.1  10.8    Total Protein 6.5 - 8.1 g/dL 8.1  7.1  6.4   Total Bilirubin 0.3 - 1.2 mg/dL 1.3  0.5    Alkaline Phos 38 - 126 U/L 64  74    AST 15 - 41 U/L 20  21    ALT 0 - 44 U/L 13  18        Latest Ref Rng & Units 08/13/2022    9:09 PM 08/25/2017    9:25 AM  CBC  WBC 4.0 - 10.5 K/uL 11.7  4.7   Hemoglobin 13.0 - 17.0 g/dL 12.2  10.2   Hematocrit 39.0 - 52.0 % 36.4  31.0   Platelets 150 - 400 K/uL 246  139    COVID, flu, RSV all negative  CXR neg  CT AP: IMPRESSION: Mild stranding about the pancreatic head and descending duodenum. 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 Recommend correlation with lipase. No ductal dilation. Follow-up is recommended to ensure resolution and exclude underlying mass.   1.0 center meter precaval node is favored reactive. Continued attention on follow-up.  Assessment and Plan: * Acute pancreatitis Pancreatitis given lipase of 98 and ? Of inflammation around head of pancreas, and epigastric TTP.  Though pancreatitis wouldn't explain his URI symptoms by itself. DDx includes  gastroenteritis  Though viral gastroenteritis doesn't usually last 2 weeks. Check C.Diff Check GI pathogen panel IVF NPO Zofran PRN Pancreatitis work up: Check triglycerides Hold lisinopril (can cause drug induced pancreatitis) Given the way the CT reads, I think ill just order MRCP on the patient to get better look at pancreas and duct. Check RVP Check Coxsackie B virus AB panel (apparently can cause URI symptoms, GI symptoms, and acute pancreatitis). URI and pancreatitis could also be  completely unrelated, but I just find it slightly odd that patient is one of 2 patients I am seeing tonight with nearly identical presentation of URI symptoms just before new year followed by GI symptoms and acute pancreatitis. 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 1) strict intake and output 2) BMP daily 3) IVF 4) hold lisinopril  HTN (hypertension) SBP 169 currently 1) continue amlodipine 2) hold lisinopril for the moment.  Prostate cancer [facility]) Looks like he's following with Dr. Alinda Money for this.      Advance Care Planning:   Code Status: Full Code  Consults: None  Family Communication: No family in room  Severity of Illness: The appropriate patient status for this patient is OBSERVATION. Observation status is judged to be reasonable and necessary in order to provide the required intensity of  service to ensure the patient's safety. The patient's presenting symptoms, physical exam findings, and initial radiographic and laboratory data in the context of their medical condition is felt to place them at decreased risk for further clinical deterioration. Furthermore, it is anticipated that the patient will be medically stable for discharge from the hospital within 2 midnights of admission.   Author: Etta Quill., DO 08/14/2022 12:27 AM  For on call review [email or web address].

## 2022-08-14 NOTE — Assessment & Plan Note (Signed)
SBP 169 currently 1) continue amlodipine 2) hold lisinopril for the moment.

## 2022-08-14 NOTE — Assessment & Plan Note (Signed)
Likely from N/V/D in setting of CKD DDx includes RTA from CKD IVF Repeat BMP in AM If persistent may need to start bicarb replacement for possible RTA

## 2022-08-15 DIAGNOSIS — K859 Acute pancreatitis without necrosis or infection, unspecified: Secondary | ICD-10-CM | POA: Diagnosis not present

## 2022-08-15 LAB — RESPIRATORY PANEL BY PCR

## 2022-08-15 LAB — COMPREHENSIVE METABOLIC PANEL
ALT: 10 U/L (ref 0–44)
AST: 13 U/L — ABNORMAL LOW (ref 15–41)
Albumin: 2.9 g/dL — ABNORMAL LOW (ref 3.5–5.0)
Alkaline Phosphatase: 48 U/L (ref 38–126)
Anion gap: 9 (ref 5–15)
BUN: 38 mg/dL — ABNORMAL HIGH (ref 8–23)
CO2: 18 mmol/L — ABNORMAL LOW (ref 22–32)
Calcium: 8.9 mg/dL (ref 8.9–10.3)
Chloride: 106 mmol/L (ref 98–111)
Creatinine, Ser: 1.77 mg/dL — ABNORMAL HIGH (ref 0.61–1.24)
GFR, Estimated: 39 mL/min — ABNORMAL LOW (ref >60)
Glucose, Bld: 88 mg/dL (ref 70–99)
Potassium: 4.1 mmol/L (ref 3.5–5.1)
Sodium: 133 mmol/L — ABNORMAL LOW (ref 135–145)
Total Bilirubin: 0.9 mg/dL (ref 0.3–1.2)
Total Protein: 5.6 g/dL — ABNORMAL LOW (ref 6.5–8.1)

## 2022-08-15 LAB — CBC
HCT: 28.4 % — ABNORMAL LOW (ref 39.0–52.0)
Hemoglobin: 9.3 g/dL — ABNORMAL LOW (ref 13.0–17.0)
MCH: 34.1 pg — ABNORMAL HIGH (ref 26.0–34.0)
MCHC: 32.7 g/dL (ref 30.0–36.0)
MCV: 104 fL — ABNORMAL HIGH (ref 80.0–100.0)
Platelets: 154 10*3/uL (ref 150–400)
RBC: 2.73 MIL/uL — ABNORMAL LOW (ref 4.22–5.81)
RDW: 12.5 % (ref 11.5–15.5)
WBC: 7.3 10*3/uL (ref 4.0–10.5)
nRBC: 0 % (ref 0.0–0.2)

## 2022-08-15 LAB — LIPASE, BLOOD: Lipase: 82 U/L — ABNORMAL HIGH (ref 11–51)

## 2022-08-15 LAB — MAGNESIUM: Magnesium: 2 mg/dL (ref 1.7–2.4)

## 2022-08-15 MED ORDER — PIPERACILLIN-TAZOBACTAM 3.375 G IVPB
3.3750 g | Freq: Three times a day (TID) | INTRAVENOUS | Status: DC
  Administered 2022-08-15: 3.375 g via INTRAVENOUS
  Filled 2022-08-15: qty 50

## 2022-08-15 MED ORDER — PIPERACILLIN-TAZOBACTAM 3.375 G IVPB
3.3750 g | Freq: Three times a day (TID) | INTRAVENOUS | Status: DC
  Administered 2022-08-16 – 2022-08-18 (×7): 3.375 g via INTRAVENOUS
  Filled 2022-08-15 (×6): qty 50

## 2022-08-15 MED ORDER — PANTOPRAZOLE SODIUM 40 MG PO TBEC
40.0000 mg | DELAYED_RELEASE_TABLET | Freq: Two times a day (BID) | ORAL | Status: DC
  Administered 2022-08-15 – 2022-08-18 (×7): 40 mg via ORAL
  Filled 2022-08-15 (×7): qty 1

## 2022-08-15 MED ORDER — LISINOPRIL 20 MG PO TABS
20.0000 mg | ORAL_TABLET | Freq: Every day | ORAL | Status: DC
  Administered 2022-08-15 – 2022-08-18 (×4): 20 mg via ORAL
  Filled 2022-08-15 (×4): qty 1

## 2022-08-15 NOTE — Progress Notes (Signed)
PHARMACY NOTE -  [place] has been assisting with dosing of Zosyn for intra-abdominal infection. Dosage remains stable at 3.375 g IV q8 hr and further renal adjustments per institutional Pharmacy antibiotic protocol  Pharmacy will sign off, following peripherally for culture results, dose adjustments, and length of therapy. Please reconsult if a change in clinical status warrants re-evaluation of dosage.  Reuel Boom, PharmD, BCPS [phone] 08/15/2022, 2:47 PM

## 2022-08-15 NOTE — Progress Notes (Signed)
PROGRESS NOTE  Jerrel Tiberio  MCN:[id number] DOB: January 07, 1946 DOA: 08/13/2022 PCP: [name], MD   Brief Narrative: Patient is a 77 year old male with history of CKD stage IV, MGUS, hypertension, prostate cancer, hyperlipidemia who presented with complaint of upper respiratory symptoms for 2 weeks with nonproductive cough, runny nose, congestion, decreased oral intake.  Also complained of diarrhea, epigastric abdominal pain.  Imagings of  abdomen on presentation suspicious for pancreatitis, elevated lipase.  Admitted for the management of acute pancreatitis.  Further imaging showed adenitis/pancreatitis.  Started on antibiotics.  Assessment & Plan:  Principal Problem:   Acute pancreatitis Active Problems:   CKD (chronic kidney disease) stage 4, GFR 15-29 ml/min (HCC)   Metabolic acidosis, NAG, bicarbonate losses   Prostate cancer (HCC)   HTN (hypertension)  Acute pancreatitis/duodenitis: Presented with decreased oral intake, epigastric abdominal pain.MRCP showed circumferential wall thickening of the descending portion of the duodenum suspicious for  infectious or inflammatory duodenitis, including peptic ulcer disease, or alternately groove pancreatitis,minimal inflammatory fat stranding about the pancreatic head. GI pathogen panel, C. difficile ordered but he does not have diarrhea. Will start on clear liquid diet. Continue Zosyn for now because of finding of duodenitis though it could be viral.  CKD stage 3b-IV: Also has history of MGUS.  Baseline creatinine around 2.  Currently kidney function at baseline.  Hypertension: Continue amlodipine. Lisinopril on hold,but will restart  History of prostate cancer: Follows with urology.  Abnormal MRI: MRCP showed  Incidental note of a probable solid mass of the inferior pole of the right kidney measuring 2.4 x 2.1 cm concerning for renal cell carcinoma.We will get  multiphasic contrast enhanced CT or MRI to fully characterize when  appropriate. No evidence of lymphadenopathy or metastatic disease in the abdomen on noncontrast examination.  Weakness: Lives with wife.  Complains of weakness.  Will get PT consultation      DVT prophylaxis:enoxaparin (LOVENOX) injection 40 mg Start: 08/14/22 1000     Code Status: Full Code  Family Communication: None at bedside  Patient status:Inpatient  Patient is from :Home  Anticipated discharge OQ:HUTM  Estimated DC date:1-2 days   Consultants: None  Procedures:None  Antimicrobials:  Anti-infectives (From admission, onward)    None       Subjective: Patient seen and examined at bedside .  Hemodynamically stable.  Not throwing up, diarrhea has stopped.  Continues to complain of some mild epigastric discomfort.  Objective: Vitals:   08/14/22 1348 08/14/22 1740 08/14/22 2100 08/15/22 0515  BP: (!) 146/68 (!) 140/72 (!) 148/58 (!) 152/67  Pulse: 64 66 79 63  Resp: $Remo'20 14 16 18  'AAfIe$ Temp: 98.1 F (36.7 C) 97.7 F (36.5 C) 98.9 F (37.2 C) 97.6 F (36.4 C)  TempSrc:  Oral Oral Oral  SpO2: 100% 100% 98% 97%  Weight:      Height:        Intake/Output Summary (Last 24 hours) at 08/15/2022 1327 Last data filed at 08/15/2022 0500 Gross per 24 hour  Intake --  Output 2 ml  Net -2 ml   Filed Weights   08/14/22 0139  Weight: 87.3 kg    Examination:  General exam: Overall comfortable, not in distress HEENT: PERRL,dry mouth Respiratory system:  no wheezes or crackles  Cardiovascular system: S1 & S2 heard, RRR.  Gastrointestinal system: Abdomen is nondistended, soft .  Tenderness in the epigastrium Central nervous system: Alert and oriented Extremities: No edema, no clubbing ,no cyanosis Skin: No rashes, no ulcers,no icterus  Data Reviewed: I have personally reviewed following labs and imaging studies  CBC: Recent Labs  Lab 08/13/22 2109 08/14/22 0557 08/15/22 0600  WBC 11.7* 9.3 7.3  HGB 12.2* 10.3* 9.3*  HCT 36.4* 30.8* 28.4*  MCV 100.8*  102.3* 104.0*  PLT 246 177 010   Basic Metabolic Panel: Recent Labs  Lab 08/13/22 2109 08/14/22 0557 08/15/22 0600  NA 133* 134* 133*  K 4.7 4.3 4.1  CL 105 107 106  CO2 17* 19* 18*  GLUCOSE 106* 99 88  BUN 48* 42* 38*  CREATININE 2.06* 1.89* 1.77*  CALCIUM 10.1 9.3 8.9  MG  --   --  2.0     Recent Results (from the past 240 hour(s))  Resp panel by RT-PCR (RSV, Flu A&B, Covid) Anterior Nasal Swab     Status: None   Collection Time: 08/13/22  8:37 PM   Specimen: Anterior Nasal Swab  Result Value Ref Range Status   SARS Coronavirus 2 by RT PCR NEGATIVE NEGATIVE Final    Comment: (NOTE) SARS-CoV-2 target nucleic acids are NOT DETECTED.  The SARS-CoV-2 RNA is generally detectable in upper respiratory specimens during the acute phase of infection. The lowest concentration of SARS-CoV-2 viral copies this assay can detect is 138 copies/mL. A negative result does not preclude SARS-Cov-2 infection and should not be used as the sole basis for treatment or other patient management decisions. A negative result may occur with  improper specimen collection/handling, submission of specimen other than nasopharyngeal swab, presence of viral mutation(s) within the areas targeted by this assay, and inadequate number of viral copies(<138 copies/mL). A negative result must be combined with clinical observations, patient history, and epidemiological information. The expected result is Negative.  Fact Sheet for Patients:  EntrepreneurPulse.com.au  Fact Sheet for Healthcare Providers:  IncredibleEmployment.be  This test is no t yet approved or cleared by the [place] FDA and  has been authorized for detection and/or diagnosis of SARS-CoV-2 by FDA under an Emergency Use Authorization (EUA). This EUA will remain  in effect (meaning this test can be used) for the duration of the COVID-19 declaration under Section 564(b)(1) of the Act,  21 U.S.C.section 360bbb-3(b)(1), unless the authorization is terminated  or revoked sooner.       Influenza A by PCR NEGATIVE NEGATIVE Final   Influenza B by PCR NEGATIVE NEGATIVE Final    Comment: (NOTE) The Xpert Xpress SARS-CoV-2/FLU/RSV plus assay is intended as an aid in the diagnosis of influenza from Nasopharyngeal swab specimens and should not be used as a sole basis for treatment. Nasal washings and aspirates are unacceptable for Xpert Xpress SARS-CoV-2/FLU/RSV testing.  Fact Sheet for Patients: EntrepreneurPulse.com.au  Fact Sheet for Healthcare Providers: IncredibleEmployment.be  This test is not yet approved or cleared by the [place] FDA and has been authorized for detection and/or diagnosis of SARS-CoV-2 by FDA under an Emergency Use Authorization (EUA). This EUA will remain in effect (meaning this test can be used) for the duration of the COVID-19 declaration under Section 564(b)(1) of the Act, 21 U.S.C. section 360bbb-3(b)(1), unless the authorization is terminated or revoked.     Resp Syncytial Virus by PCR NEGATIVE NEGATIVE Final    Comment: (NOTE) Fact Sheet for Patients: EntrepreneurPulse.com.au  Fact Sheet for Healthcare Providers: IncredibleEmployment.be  This test is not yet approved or cleared by the [place] FDA and has been authorized for detection and/or diagnosis of SARS-CoV-2 by FDA under an Emergency Use Authorization (EUA). This EUA will remain in effect (meaning  this test can be used) for the duration of the COVID-19 declaration under Section 564(b)(1) of the Act, 21 U.S.C. section 360bbb-3(b)(1), unless the authorization is terminated or revoked.  Performed at [facility], [place] [address], [place], [place] [address]   Respiratory (~20 pathogens) panel by PCR     Status: None   Collection Time: 08/13/22  8:37 PM   Specimen:  Nasopharyngeal Swab; Respiratory  Result Value Ref Range Status   Adenovirus NOT DETECTED NOT DETECTED Final   Coronavirus 229E NOT DETECTED NOT DETECTED Final    Comment: (NOTE) The Coronavirus on the Respiratory Panel, DOES NOT test for the novel  Coronavirus (2019 nCoV)    Coronavirus HKU1 NOT DETECTED NOT DETECTED Final   Coronavirus NL63 NOT DETECTED NOT DETECTED Final   Coronavirus OC43 NOT DETECTED NOT DETECTED Final   Metapneumovirus NOT DETECTED NOT DETECTED Final   Rhinovirus / Enterovirus NOT DETECTED NOT DETECTED Final   Influenza A NOT DETECTED NOT DETECTED Final   Influenza B NOT DETECTED NOT DETECTED Final   Parainfluenza Virus 1 NOT DETECTED NOT DETECTED Final   Parainfluenza Virus 2 NOT DETECTED NOT DETECTED Final   Parainfluenza Virus 3 NOT DETECTED NOT DETECTED Final   Parainfluenza Virus 4 NOT DETECTED NOT DETECTED Final   Respiratory Syncytial Virus NOT DETECTED NOT DETECTED Final   Bordetella pertussis NOT DETECTED NOT DETECTED Final   Bordetella Parapertussis NOT DETECTED NOT DETECTED Final   Chlamydophila pneumoniae NOT DETECTED NOT DETECTED Final   Mycoplasma pneumoniae NOT DETECTED NOT DETECTED Final    Comment: Performed at [facility], [place]. [address], [place], [place] [address]     Radiology Studies: MR ABDOMEN MRCP WO CONTRAST  Result Date: 08/14/2022 CLINICAL DATA:  Pancreatitis suspected, abdominal pain, nausea and vomiting EXAM: MRI ABDOMEN WITHOUT CONTRAST  (INCLUDING MRCP) TECHNIQUE: Multiplanar multisequence MR imaging of the abdomen was performed. Heavily T2-weighted images of the biliary and pancreatic ducts were obtained, and three-dimensional MRCP images were rendered by post processing. COMPARISON:  CT abdomen pelvis, 08/13/2022, MR abdomen, 08/27/2018 FINDINGS: Lower chest: No acute abnormality. Hepatobiliary: No solid liver abnormality is seen. Probable sludge ball in the gallbladder (series 4, image 19). No gallstones, gallbladder wall  thickening, or biliary dilatation. Pancreas: Minimal inflammatory fat stranding about the pancreatic head adjacent to the descending duodenum. No pancreatic ductal dilatation or other inflammatory findings about the pancreas. Spleen: Normal in size without significant abnormality. Adrenals/Urinary Tract: Adrenal glands are unremarkable. Incidental note of a probable solid mass of the inferior pole of the right kidney measuring 2.4 x 2.1 cm (series 3, image 15). Multiple additional simple appearing bilateral renal cortical and parapelvic cysts, benign. Stomach/Bowel: Stomach is within normal limits. Circumferential wall thickening of the descending portion of the duodenum (series 5, image 23). Small amount of adjacent inflammatory fat stranding. Vascular/Lymphatic: No significant vascular findings are present. No enlarged abdominal lymph nodes. Other: No abdominal wall hernia or abnormality. No ascites. Musculoskeletal: No acute or significant osseous findings. IMPRESSION: 1. Circumferential wall thickening of the descending portion of the duodenum, with small amount of adjacent inflammatory fat stranding. This may reflect infectious or inflammatory duodenitis, including peptic ulcer disease, or alternately groove pancreatitis. 2. Minimal inflammatory fat stranding about the pancreatic head adjacent to the descending duodenum, as above. No pancreatic ductal dilatation or other inflammatory findings about the remainder of the pancreas. 3. Probable sludge ball in the gallbladder. No gallstones, gallbladder wall thickening, or biliary dilatation. 4. Incidental note of a  probable solid mass of the inferior pole of the right kidney measuring 2.4 x 2.1 cm concerning for renal cell carcinoma. Recommend multiphasic contrast enhanced CT or MRI to fully characterize. No evidence of lymphadenopathy or metastatic disease in the abdomen on noncontrast examination. Electronically Signed   By: Delanna Ahmadi M.D.   On: 08/14/2022  16:48   CT ABDOMEN PELVIS WO CONTRAST  Result Date: 08/13/2022 CLINICAL DATA:  Acute abdominal pain.  Nausea and vomiting. EXAM: CT ABDOMEN AND PELVIS WITHOUT CONTRAST TECHNIQUE: Multidetector CT imaging of the abdomen and pelvis was performed following the standard protocol without IV contrast. RADIATION DOSE REDUCTION: This exam was performed according to the departmental dose-optimization program which includes automated exposure control, adjustment of the mA and/or kV according to patient size and/or use of iterative reconstruction technique. COMPARISON:  CT pelvis 09/22/2015 and MR abdomen 08/27/2018 FINDINGS: Lower chest: No acute abnormality. Hepatobiliary: No focal liver abnormality is seen. No gallstones, gallbladder wall thickening, or biliary dilatation. Pancreas: Mild stranding about the descending duodenum/pancreatic head. No ductal dilation. Spleen: Normal in size without focal abnormality. Adrenals/Urinary Tract: Normal adrenal glands. Bilateral cortical renal scarring. No urinary calculi or hydronephrosis. Unremarkable bladder. Stomach/Bowel: Normal caliber large and small bowel. Colonic diverticulosis without diverticulitis. Normal appendix. Mild wall thickening and adjacent stranding about the descending duodenum (circa series 2/image 31). Vascular/Lymphatic: Aortic atherosclerosis. 1.0 cm precaval node (2/29). Reproductive: Metallic densities in the prostate. Other: Fat containing bilateral inguinal hernias. No free intraperitoneal air. Musculoskeletal: No acute osseous abnormality. IMPRESSION: Mild stranding about the pancreatic head and descending duodenum. 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 Recommend correlation with lipase. No ductal dilation. Follow-up is recommended to ensure resolution and exclude underlying mass. 1.0 center meter precaval node is favored reactive. Continued attention on follow-up. Aortic Atherosclerosis (ICD10-I70.0). Electronically Signed    By: Placido Sou M.D.   On: 08/13/2022 23:21   CT Head Wo Contrast  Result Date: 08/13/2022 CLINICAL DATA:  Headache, new onset. Malaise, decreased appetite, nausea and vomiting. EXAM: CT HEAD WITHOUT CONTRAST TECHNIQUE: Contiguous axial images were obtained from the base of the skull through the vertex without intravenous contrast. RADIATION DOSE REDUCTION: This exam was performed according to the departmental dose-optimization program which includes automated exposure control, adjustment of the mA and/or kV according to patient size and/or use of iterative reconstruction technique. COMPARISON:  12/25/2020. FINDINGS: Brain: No acute intracranial hemorrhage, midline shift or mass effect. No extra-axial fluid collection. Gray-white matter differentiation is within normal limits. No hydrocephalus. Vascular: No hyperdense vessel or unexpected calcification. Skull: Normal. Negative for fracture or focal lesion. Sinuses/Orbits: Mild debris in the maxillary sinuses and ethmoid air cells. No acute orbital abnormality. Other: None. IMPRESSION: No acute intracranial process. Electronically Signed   By: Brett Fairy M.D.   On: 08/13/2022 23:20   DG Chest Port 1 View  Result Date: 08/13/2022 CLINICAL DATA:  Decreased appetite with nausea and vomiting. EXAM: PORTABLE CHEST 1 VIEW COMPARISON:  August 09, 2022 FINDINGS: The heart size and mediastinal contours are within normal limits. Both lungs are clear. The visualized skeletal structures are unremarkable. IMPRESSION: No active disease. Electronically Signed   By: Virgina Norfolk M.D.   On: 08/13/2022 21:00    Scheduled Meds:  allopurinol  100 mg Oral Daily   amLODipine  5 mg Oral Daily   enoxaparin (LOVENOX) injection  40 mg Subcutaneous Q24H   ferrous sulfate  325 mg Oral Q breakfast   gabapentin  300 mg Oral BID  Continuous Infusions:  lactated ringers 125 mL/hr at 08/15/22 1247     LOS: 1 day   Shelly Coss, MD [facility] P1/03/2023,  1:27 PM  PROGRESS NOTE  Beatriz Settles  GGE:[id number] DOB: 1946-02-05 DOA: 08/13/2022 PCP: [name], MD   Brief Narrative:   Assessment & Plan:  Principal Problem:   Acute pancreatitis Active Problems:   CKD (chronic kidney disease) stage 4, GFR 15-29 ml/min (HCC)   Metabolic acidosis, NAG, bicarbonate losses   Prostate cancer (HCC)   HTN (hypertension)           DVT prophylaxis:enoxaparin (LOVENOX) injection 40 mg Start: 08/14/22 1000     Code Status: Full Code  Family Communication:   Patient status:  Patient is from :  Anticipated discharge to:  Estimated DC date:   Consultants:   Procedures:  Antimicrobials:  Anti-infectives (From admission, onward)    None       Subjective:   Objective: Vitals:   08/14/22 1348 08/14/22 1740 08/14/22 2100 08/15/22 0515  BP: (!) 146/68 (!) 140/72 (!) 148/58 (!) 152/67  Pulse: 64 66 79 63  Resp: $Remo'20 14 16 18  'dpUwr$ Temp: 98.1 F (36.7 C) 97.7 F (36.5 C) 98.9 F (37.2 C) 97.6 F (36.4 C)  TempSrc:  Oral Oral Oral  SpO2: 100% 100% 98% 97%  Weight:      Height:        Intake/Output Summary (Last 24 hours) at 08/15/2022 1327 Last data filed at 08/15/2022 0500 Gross per 24 hour  Intake --  Output 2 ml  Net -2 ml   Filed Weights   08/14/22 0139  Weight: 87.3 kg    Examination:  General exam: Overall comfortable, not in distress HEENT: PERRL Respiratory system:  no wheezes or crackles  Cardiovascular system: S1 & S2 heard, RRR.  Gastrointestinal system: Abdomen is nondistended, soft and nontender. Central nervous system: Alert and oriented Extremities: No edema, no clubbing ,no cyanosis Skin: No rashes, no ulcers,no icterus     Data Reviewed: I have personally reviewed following labs and imaging studies  CBC: Recent Labs  Lab 08/13/22 2109 08/14/22 0557 08/15/22 0600  WBC 11.7* 9.3 7.3  HGB 12.2* 10.3* 9.3*  HCT 36.4* 30.8* 28.4*  MCV 100.8* 102.3* 104.0*  PLT 246 177 465   Basic  Metabolic Panel: Recent Labs  Lab 08/13/22 2109 08/14/22 0557 08/15/22 0600  NA 133* 134* 133*  K 4.7 4.3 4.1  CL 105 107 106  CO2 17* 19* 18*  GLUCOSE 106* 99 88  BUN 48* 42* 38*  CREATININE 2.06* 1.89* 1.77*  CALCIUM 10.1 9.3 8.9  MG  --   --  2.0     Recent Results (from the past 240 hour(s))  Resp panel by RT-PCR (RSV, Flu A&B, Covid) Anterior Nasal Swab     Status: None   Collection Time: 08/13/22  8:37 PM   Specimen: Anterior Nasal Swab  Result Value Ref Range Status   SARS Coronavirus 2 by RT PCR NEGATIVE NEGATIVE Final    Comment: (NOTE) SARS-CoV-2 target nucleic acids are NOT DETECTED.  The SARS-CoV-2 RNA is generally detectable in upper respiratory specimens during the acute phase of infection. The lowest concentration of SARS-CoV-2 viral copies this assay can detect is 138 copies/mL. A negative result does not preclude SARS-Cov-2 infection and should not be used as the sole basis for treatment or other patient management decisions. A negative result may occur with  improper specimen collection/handling, submission of specimen other than nasopharyngeal  swab, presence of viral mutation(s) within the areas targeted by this assay, and inadequate number of viral copies(<138 copies/mL). A negative result must be combined with clinical observations, patient history, and epidemiological information. The expected result is Negative.  Fact Sheet for Patients:  EntrepreneurPulse.com.au  Fact Sheet for Healthcare Providers:  IncredibleEmployment.be  This test is no t yet approved or cleared by the [place] FDA and  has been authorized for detection and/or diagnosis of SARS-CoV-2 by FDA under an Emergency Use Authorization (EUA). This EUA will remain  in effect (meaning this test can be used) for the duration of the COVID-19 declaration under Section 564(b)(1) of the Act, 21 U.S.C.section 360bbb-3(b)(1), unless the authorization  is terminated  or revoked sooner.       Influenza A by PCR NEGATIVE NEGATIVE Final   Influenza B by PCR NEGATIVE NEGATIVE Final    Comment: (NOTE) The Xpert Xpress SARS-CoV-2/FLU/RSV plus assay is intended as an aid in the diagnosis of influenza from Nasopharyngeal swab specimens and should not be used as a sole basis for treatment. Nasal washings and aspirates are unacceptable for Xpert Xpress SARS-CoV-2/FLU/RSV testing.  Fact Sheet for Patients: EntrepreneurPulse.com.au  Fact Sheet for Healthcare Providers: IncredibleEmployment.be  This test is not yet approved or cleared by the [place] FDA and has been authorized for detection and/or diagnosis of SARS-CoV-2 by FDA under an Emergency Use Authorization (EUA). This EUA will remain in effect (meaning this test can be used) for the duration of the COVID-19 declaration under Section 564(b)(1) of the Act, 21 U.S.C. section 360bbb-3(b)(1), unless the authorization is terminated or revoked.     Resp Syncytial Virus by PCR NEGATIVE NEGATIVE Final    Comment: (NOTE) Fact Sheet for Patients: EntrepreneurPulse.com.au  Fact Sheet for Healthcare Providers: IncredibleEmployment.be  This test is not yet approved or cleared by the [place] FDA and has been authorized for detection and/or diagnosis of SARS-CoV-2 by FDA under an Emergency Use Authorization (EUA). This EUA will remain in effect (meaning this test can be used) for the duration of the COVID-19 declaration under Section 564(b)(1) of the Act, 21 U.S.C. section 360bbb-3(b)(1), unless the authorization is terminated or revoked.  Performed at [facility], [place] [address], [place], [place] [address]   Respiratory (~20 pathogens) panel by PCR     Status: None   Collection Time: 08/13/22  8:37 PM   Specimen: Nasopharyngeal Swab; Respiratory  Result Value Ref Range Status    Adenovirus NOT DETECTED NOT DETECTED Final   Coronavirus 229E NOT DETECTED NOT DETECTED Final    Comment: (NOTE) The Coronavirus on the Respiratory Panel, DOES NOT test for the novel  Coronavirus (2019 nCoV)    Coronavirus HKU1 NOT DETECTED NOT DETECTED Final   Coronavirus NL63 NOT DETECTED NOT DETECTED Final   Coronavirus OC43 NOT DETECTED NOT DETECTED Final   Metapneumovirus NOT DETECTED NOT DETECTED Final   Rhinovirus / Enterovirus NOT DETECTED NOT DETECTED Final   Influenza A NOT DETECTED NOT DETECTED Final   Influenza B NOT DETECTED NOT DETECTED Final   Parainfluenza Virus 1 NOT DETECTED NOT DETECTED Final   Parainfluenza Virus 2 NOT DETECTED NOT DETECTED Final   Parainfluenza Virus 3 NOT DETECTED NOT DETECTED Final   Parainfluenza Virus 4 NOT DETECTED NOT DETECTED Final   Respiratory Syncytial Virus NOT DETECTED NOT DETECTED Final   Bordetella pertussis NOT DETECTED NOT DETECTED Final   Bordetella Parapertussis NOT DETECTED NOT DETECTED Final   Chlamydophila pneumoniae NOT DETECTED NOT DETECTED  Final   Mycoplasma pneumoniae NOT DETECTED NOT DETECTED Final    Comment: Performed at [facility], [place] [address], [place], [place] [address]     Radiology Studies: MR ABDOMEN MRCP WO CONTRAST  Result Date: 08/14/2022 CLINICAL DATA:  Pancreatitis suspected, abdominal pain, nausea and vomiting EXAM: MRI ABDOMEN WITHOUT CONTRAST  (INCLUDING MRCP) TECHNIQUE: Multiplanar multisequence MR imaging of the abdomen was performed. Heavily T2-weighted images of the biliary and pancreatic ducts were obtained, and three-dimensional MRCP images were rendered by post processing. COMPARISON:  CT abdomen pelvis, 08/13/2022, MR abdomen, 08/27/2018 FINDINGS: Lower chest: No acute abnormality. Hepatobiliary: No solid liver abnormality is seen. Probable sludge ball in the gallbladder (series 4, image 19). No gallstones, gallbladder wall thickening, or biliary dilatation. Pancreas: Minimal inflammatory fat  stranding about the pancreatic head adjacent to the descending duodenum. No pancreatic ductal dilatation or other inflammatory findings about the pancreas. Spleen: Normal in size without significant abnormality. Adrenals/Urinary Tract: Adrenal glands are unremarkable. Incidental note of a probable solid mass of the inferior pole of the right kidney measuring 2.4 x 2.1 cm (series 3, image 15). Multiple additional simple appearing bilateral renal cortical and parapelvic cysts, benign. Stomach/Bowel: Stomach is within normal limits. Circumferential wall thickening of the descending portion of the duodenum (series 5, image 23). Small amount of adjacent inflammatory fat stranding. Vascular/Lymphatic: No significant vascular findings are present. No enlarged abdominal lymph nodes. Other: No abdominal wall hernia or abnormality. No ascites. Musculoskeletal: No acute or significant osseous findings. IMPRESSION: 1. Circumferential wall thickening of the descending portion of the duodenum, with small amount of adjacent inflammatory fat stranding. This may reflect infectious or inflammatory duodenitis, including peptic ulcer disease, or alternately groove pancreatitis. 2. Minimal inflammatory fat stranding about the pancreatic head adjacent to the descending duodenum, as above. No pancreatic ductal dilatation or other inflammatory findings about the remainder of the pancreas. 3. Probable sludge ball in the gallbladder. No gallstones, gallbladder wall thickening, or biliary dilatation. 4. Incidental note of a probable solid mass of the inferior pole of the right kidney measuring 2.4 x 2.1 cm concerning for renal cell carcinoma. Recommend multiphasic contrast enhanced CT or MRI to fully characterize. No evidence of lymphadenopathy or metastatic disease in the abdomen on noncontrast examination. Electronically Signed   By: Delanna Ahmadi M.D.   On: 08/14/2022 16:48   CT ABDOMEN PELVIS WO CONTRAST  Result Date:  08/13/2022 CLINICAL DATA:  Acute abdominal pain.  Nausea and vomiting. EXAM: CT ABDOMEN AND PELVIS WITHOUT CONTRAST TECHNIQUE: Multidetector CT imaging of the abdomen and pelvis was performed following the standard protocol without IV contrast. RADIATION DOSE REDUCTION: This exam was performed according to the departmental dose-optimization program which includes automated exposure control, adjustment of the mA and/or kV according to patient size and/or use of iterative reconstruction technique. COMPARISON:  CT pelvis 09/22/2015 and MR abdomen 08/27/2018 FINDINGS: Lower chest: No acute abnormality. Hepatobiliary: No focal liver abnormality is seen. No gallstones, gallbladder wall thickening, or biliary dilatation. Pancreas: Mild stranding about the descending duodenum/pancreatic head. No ductal dilation. Spleen: Normal in size without focal abnormality. Adrenals/Urinary Tract: Normal adrenal glands. Bilateral cortical renal scarring. No urinary calculi or hydronephrosis. Unremarkable bladder. Stomach/Bowel: Normal caliber large and small bowel. Colonic diverticulosis without diverticulitis. Normal appendix. Mild wall thickening and adjacent stranding about the descending duodenum (circa series 2/image 31). Vascular/Lymphatic: Aortic atherosclerosis. 1.0 cm precaval node (2/29). Reproductive: Metallic densities in the prostate. Other: Fat containing bilateral inguinal hernias. No free intraperitoneal air. Musculoskeletal: No  acute osseous abnormality. IMPRESSION: Mild stranding about the pancreatic head and descending duodenum. 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 Recommend correlation with lipase. No ductal dilation. Follow-up is recommended to ensure resolution and exclude underlying mass. 1.0 center meter precaval node is favored reactive. Continued attention on follow-up. Aortic Atherosclerosis (ICD10-I70.0). Electronically Signed   By: Placido Sou M.D.   On: 08/13/2022 23:21   CT  Head Wo Contrast  Result Date: 08/13/2022 CLINICAL DATA:  Headache, new onset. Malaise, decreased appetite, nausea and vomiting. EXAM: CT HEAD WITHOUT CONTRAST TECHNIQUE: Contiguous axial images were obtained from the base of the skull through the vertex without intravenous contrast. RADIATION DOSE REDUCTION: This exam was performed according to the departmental dose-optimization program which includes automated exposure control, adjustment of the mA and/or kV according to patient size and/or use of iterative reconstruction technique. COMPARISON:  12/25/2020. FINDINGS: Brain: No acute intracranial hemorrhage, midline shift or mass effect. No extra-axial fluid collection. Gray-white matter differentiation is within normal limits. No hydrocephalus. Vascular: No hyperdense vessel or unexpected calcification. Skull: Normal. Negative for fracture or focal lesion. Sinuses/Orbits: Mild debris in the maxillary sinuses and ethmoid air cells. No acute orbital abnormality. Other: None. IMPRESSION: No acute intracranial process. Electronically Signed   By: Brett Fairy M.D.   On: 08/13/2022 23:20   DG Chest Port 1 View  Result Date: 08/13/2022 CLINICAL DATA:  Decreased appetite with nausea and vomiting. EXAM: PORTABLE CHEST 1 VIEW COMPARISON:  August 09, 2022 FINDINGS: The heart size and mediastinal contours are within normal limits. Both lungs are clear. The visualized skeletal structures are unremarkable. IMPRESSION: No active disease. Electronically Signed   By: Virgina Norfolk M.D.   On: 08/13/2022 21:00    Scheduled Meds:  allopurinol  100 mg Oral Daily   amLODipine  5 mg Oral Daily   enoxaparin (LOVENOX) injection  40 mg Subcutaneous Q24H   ferrous sulfate  325 mg Oral Q breakfast   gabapentin  300 mg Oral BID   Continuous Infusions:  lactated ringers 125 mL/hr at 08/15/22 1247     LOS: 1 day   Shelly Coss, MD [facility] P1/03/2023, 1:27 PM  PROGRESS NOTE  Rajvir Ernster   KMQ:[id number] DOB: 1945-12-28 DOA: 08/13/2022 PCP: [name], MD   Brief Narrative:   Assessment & Plan:  Principal Problem:   Acute pancreatitis Active Problems:   CKD (chronic kidney disease) stage 4, GFR 15-29 ml/min (HCC)   Metabolic acidosis, NAG, bicarbonate losses   Prostate cancer (HCC)   HTN (hypertension)           DVT prophylaxis:enoxaparin (LOVENOX) injection 40 mg Start: 08/14/22 1000     Code Status: Full Code  Family Communication:   Patient status:  Patient is from :  Anticipated discharge to:  Estimated DC date:   Consultants:   Procedures:  Antimicrobials:  Anti-infectives (From admission, onward)    None       Subjective:   Objective: Vitals:   08/14/22 1348 08/14/22 1740 08/14/22 2100 08/15/22 0515  BP: (!) 146/68 (!) 140/72 (!) 148/58 (!) 152/67  Pulse: 64 66 79 63  Resp: $Remo'20 14 16 18  'ANshM$ Temp: 98.1 F (36.7 C) 97.7 F (36.5 C) 98.9 F (37.2 C) 97.6 F (36.4 C)  TempSrc:  Oral Oral Oral  SpO2: 100% 100% 98% 97%  Weight:      Height:        Intake/Output Summary (Last 24 hours) at 08/15/2022 1328 Last data filed at  08/15/2022 0500 Gross per 24 hour  Intake --  Output 2 ml  Net -2 ml   Filed Weights   08/14/22 0139  Weight: 87.3 kg    Examination:  General exam: Overall comfortable, not in distress HEENT: PERRL Respiratory system:  no wheezes or crackles  Cardiovascular system: S1 & S2 heard, RRR.  Gastrointestinal system: Abdomen is nondistended, soft and nontender. Central nervous system: Alert and oriented Extremities: No edema, no clubbing ,no cyanosis Skin: No rashes, no ulcers,no icterus     Data Reviewed: I have personally reviewed following labs and imaging studies  CBC: Recent Labs  Lab 08/13/22 2109 08/14/22 0557 08/15/22 0600  WBC 11.7* 9.3 7.3  HGB 12.2* 10.3* 9.3*  HCT 36.4* 30.8* 28.4*  MCV 100.8* 102.3* 104.0*  PLT 246 177 782   Basic Metabolic Panel: Recent Labs  Lab  08/13/22 2109 08/14/22 0557 08/15/22 0600  NA 133* 134* 133*  K 4.7 4.3 4.1  CL 105 107 106  CO2 17* 19* 18*  GLUCOSE 106* 99 88  BUN 48* 42* 38*  CREATININE 2.06* 1.89* 1.77*  CALCIUM 10.1 9.3 8.9  MG  --   --  2.0     Recent Results (from the past 240 hour(s))  Resp panel by RT-PCR (RSV, Flu A&B, Covid) Anterior Nasal Swab     Status: None   Collection Time: 08/13/22  8:37 PM   Specimen: Anterior Nasal Swab  Result Value Ref Range Status   SARS Coronavirus 2 by RT PCR NEGATIVE NEGATIVE Final    Comment: (NOTE) SARS-CoV-2 target nucleic acids are NOT DETECTED.  The SARS-CoV-2 RNA is generally detectable in upper respiratory specimens during the acute phase of infection. The lowest concentration of SARS-CoV-2 viral copies this assay can detect is 138 copies/mL. A negative result does not preclude SARS-Cov-2 infection and should not be used as the sole basis for treatment or other patient management decisions. A negative result may occur with  improper specimen collection/handling, submission of specimen other than nasopharyngeal swab, presence of viral mutation(s) within the areas targeted by this assay, and inadequate number of viral copies(<138 copies/mL). A negative result must be combined with clinical observations, patient history, and epidemiological information. The expected result is Negative.  Fact Sheet for Patients:  EntrepreneurPulse.com.au  Fact Sheet for Healthcare Providers:  IncredibleEmployment.be  This test is no t yet approved or cleared by the [place] FDA and  has been authorized for detection and/or diagnosis of SARS-CoV-2 by FDA under an Emergency Use Authorization (EUA). This EUA will remain  in effect (meaning this test can be used) for the duration of the COVID-19 declaration under Section 564(b)(1) of the Act, 21 U.S.C.section 360bbb-3(b)(1), unless the authorization is terminated  or revoked sooner.        Influenza A by PCR NEGATIVE NEGATIVE Final   Influenza B by PCR NEGATIVE NEGATIVE Final    Comment: (NOTE) The Xpert Xpress SARS-CoV-2/FLU/RSV plus assay is intended as an aid in the diagnosis of influenza from Nasopharyngeal swab specimens and should not be used as a sole basis for treatment. Nasal washings and aspirates are unacceptable for Xpert Xpress SARS-CoV-2/FLU/RSV testing.  Fact Sheet for Patients: EntrepreneurPulse.com.au  Fact Sheet for Healthcare Providers: IncredibleEmployment.be  This test is not yet approved or cleared by the [place] FDA and has been authorized for detection and/or diagnosis of SARS-CoV-2 by FDA under an Emergency Use Authorization (EUA). This EUA will remain in effect (meaning this test can be used) for the  duration of the COVID-19 declaration under Section 564(b)(1) of the Act, 21 U.S.C. section 360bbb-3(b)(1), unless the authorization is terminated or revoked.     Resp Syncytial Virus by PCR NEGATIVE NEGATIVE Final    Comment: (NOTE) Fact Sheet for Patients: EntrepreneurPulse.com.au  Fact Sheet for Healthcare Providers: IncredibleEmployment.be  This test is not yet approved or cleared by the [place] FDA and has been authorized for detection and/or diagnosis of SARS-CoV-2 by FDA under an Emergency Use Authorization (EUA). This EUA will remain in effect (meaning this test can be used) for the duration of the COVID-19 declaration under Section 564(b)(1) of the Act, 21 U.S.C. section 360bbb-3(b)(1), unless the authorization is terminated or revoked.  Performed at [facility], [place] [address] [place], [place] [address]   Respiratory (~20 pathogens) panel by PCR     Status: None   Collection Time: 08/13/22  8:37 PM   Specimen: Nasopharyngeal Swab; Respiratory  Result Value Ref Range Status   Adenovirus NOT DETECTED NOT DETECTED  Final   Coronavirus 229E NOT DETECTED NOT DETECTED Final    Comment: (NOTE) The Coronavirus on the Respiratory Panel, DOES NOT test for the novel  Coronavirus (2019 nCoV)    Coronavirus HKU1 NOT DETECTED NOT DETECTED Final   Coronavirus NL63 NOT DETECTED NOT DETECTED Final   Coronavirus OC43 NOT DETECTED NOT DETECTED Final   Metapneumovirus NOT DETECTED NOT DETECTED Final   Rhinovirus / Enterovirus NOT DETECTED NOT DETECTED Final   Influenza A NOT DETECTED NOT DETECTED Final   Influenza B NOT DETECTED NOT DETECTED Final   Parainfluenza Virus 1 NOT DETECTED NOT DETECTED Final   Parainfluenza Virus 2 NOT DETECTED NOT DETECTED Final   Parainfluenza Virus 3 NOT DETECTED NOT DETECTED Final   Parainfluenza Virus 4 NOT DETECTED NOT DETECTED Final   Respiratory Syncytial Virus NOT DETECTED NOT DETECTED Final   Bordetella pertussis NOT DETECTED NOT DETECTED Final   Bordetella Parapertussis NOT DETECTED NOT DETECTED Final   Chlamydophila pneumoniae NOT DETECTED NOT DETECTED Final   Mycoplasma pneumoniae NOT DETECTED NOT DETECTED Final    Comment: Performed at [facility], [place]. [address], [place], [place] [address]     Radiology Studies: MR ABDOMEN MRCP WO CONTRAST  Result Date: 08/14/2022 CLINICAL DATA:  Pancreatitis suspected, abdominal pain, nausea and vomiting EXAM: MRI ABDOMEN WITHOUT CONTRAST  (INCLUDING MRCP) TECHNIQUE: Multiplanar multisequence MR imaging of the abdomen was performed. Heavily T2-weighted images of the biliary and pancreatic ducts were obtained, and three-dimensional MRCP images were rendered by post processing. COMPARISON:  CT abdomen pelvis, 08/13/2022, MR abdomen, 08/27/2018 FINDINGS: Lower chest: No acute abnormality. Hepatobiliary: No solid liver abnormality is seen. Probable sludge ball in the gallbladder (series 4, image 19). No gallstones, gallbladder wall thickening, or biliary dilatation. Pancreas: Minimal inflammatory fat stranding about the pancreatic head  adjacent to the descending duodenum. No pancreatic ductal dilatation or other inflammatory findings about the pancreas. Spleen: Normal in size without significant abnormality. Adrenals/Urinary Tract: Adrenal glands are unremarkable. Incidental note of a probable solid mass of the inferior pole of the right kidney measuring 2.4 x 2.1 cm (series 3, image 15). Multiple additional simple appearing bilateral renal cortical and parapelvic cysts, benign. Stomach/Bowel: Stomach is within normal limits. Circumferential wall thickening of the descending portion of the duodenum (series 5, image 23). Small amount of adjacent inflammatory fat stranding. Vascular/Lymphatic: No significant vascular findings are present. No enlarged abdominal lymph nodes. Other: No abdominal wall hernia or abnormality. No ascites. Musculoskeletal: No  acute or significant osseous findings. IMPRESSION: 1. Circumferential wall thickening of the descending portion of the duodenum, with small amount of adjacent inflammatory fat stranding. This may reflect infectious or inflammatory duodenitis, including peptic ulcer disease, or alternately groove pancreatitis. 2. Minimal inflammatory fat stranding about the pancreatic head adjacent to the descending duodenum, as above. No pancreatic ductal dilatation or other inflammatory findings about the remainder of the pancreas. 3. Probable sludge ball in the gallbladder. No gallstones, gallbladder wall thickening, or biliary dilatation. 4. Incidental note of a probable solid mass of the inferior pole of the right kidney measuring 2.4 x 2.1 cm concerning for renal cell carcinoma. Recommend multiphasic contrast enhanced CT or MRI to fully characterize. No evidence of lymphadenopathy or metastatic disease in the abdomen on noncontrast examination. Electronically Signed   By: Jearld Lesch M.D.   On: 08/14/2022 16:48   CT ABDOMEN PELVIS WO CONTRAST  Result Date: 08/13/2022 CLINICAL DATA:  Acute abdominal pain.   Nausea and vomiting. EXAM: CT ABDOMEN AND PELVIS WITHOUT CONTRAST TECHNIQUE: Multidetector CT imaging of the abdomen and pelvis was performed following the standard protocol without IV contrast. RADIATION DOSE REDUCTION: This exam was performed according to the departmental dose-optimization program which includes automated exposure control, adjustment of the mA and/or kV according to patient size and/or use of iterative reconstruction technique. COMPARISON:  CT pelvis 09/22/2015 and MR abdomen 08/27/2018 FINDINGS: Lower chest: No acute abnormality. Hepatobiliary: No focal liver abnormality is seen. No gallstones, gallbladder wall thickening, or biliary dilatation. Pancreas: Mild stranding about the descending duodenum/pancreatic head. No ductal dilation. Spleen: Normal in size without focal abnormality. Adrenals/Urinary Tract: Normal adrenal glands. Bilateral cortical renal scarring. No urinary calculi or hydronephrosis. Unremarkable bladder. Stomach/Bowel: Normal caliber large and small bowel. Colonic diverticulosis without diverticulitis. Normal appendix. Mild wall thickening and adjacent stranding about the descending duodenum (circa series 2/image 31). Vascular/Lymphatic: Aortic atherosclerosis. 1.0 cm precaval node (2/29). Reproductive: Metallic densities in the prostate. Other: Fat containing bilateral inguinal hernias. No free intraperitoneal air. Musculoskeletal: No acute osseous abnormality. IMPRESSION: Mild stranding about the pancreatic head and descending duodenum. 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 Recommend correlation with lipase. No ductal dilation. Follow-up is recommended to ensure resolution and exclude underlying mass. 1.0 center meter precaval node is favored reactive. Continued attention on follow-up. Aortic Atherosclerosis (ICD10-I70.0). Electronically Signed   By: Minerva Fester M.D.   On: 08/13/2022 23:21   CT Head Wo Contrast  Result Date:  08/13/2022 CLINICAL DATA:  Headache, new onset. Malaise, decreased appetite, nausea and vomiting. EXAM: CT HEAD WITHOUT CONTRAST TECHNIQUE: Contiguous axial images were obtained from the base of the skull through the vertex without intravenous contrast. RADIATION DOSE REDUCTION: This exam was performed according to the departmental dose-optimization program which includes automated exposure control, adjustment of the mA and/or kV according to patient size and/or use of iterative reconstruction technique. COMPARISON:  12/25/2020. FINDINGS: Brain: No acute intracranial hemorrhage, midline shift or mass effect. No extra-axial fluid collection. Gray-white matter differentiation is within normal limits. No hydrocephalus. Vascular: No hyperdense vessel or unexpected calcification. Skull: Normal. Negative for fracture or focal lesion. Sinuses/Orbits: Mild debris in the maxillary sinuses and ethmoid air cells. No acute orbital abnormality. Other: None. IMPRESSION: No acute intracranial process. Electronically Signed   By: Thornell Sartorius M.D.   On: 08/13/2022 23:20   DG Chest Port 1 View  Result Date: 08/13/2022 CLINICAL DATA:  Decreased appetite with nausea and vomiting. EXAM: PORTABLE CHEST 1 VIEW COMPARISON:  August 09, 2022 FINDINGS: The heart size and mediastinal contours are within normal limits. Both lungs are clear. The visualized skeletal structures are unremarkable. IMPRESSION: No active disease. Electronically Signed   By: Virgina Norfolk M.D.   On: 08/13/2022 21:00    Scheduled Meds:  allopurinol  100 mg Oral Daily   amLODipine  5 mg Oral Daily   enoxaparin (LOVENOX) injection  40 mg Subcutaneous Q24H   ferrous sulfate  325 mg Oral Q breakfast   gabapentin  300 mg Oral BID   Continuous Infusions:  lactated ringers 125 mL/hr at 08/15/22 1247     LOS: 1 day   Shelly Coss, MD [facility] P1/03/2023, 1:28 PM

## 2022-08-15 NOTE — Evaluation (Signed)
Physical Therapy One Time Evaluation Patient Details Name: Thomas Guerrero MRN: [id number] DOB: [birth year] Today's Date: 08/15/2022  History of Present Illness  77 year old male with history of CKD stage IV, MGUS, hypertension, prostate cancer, hyperlipidemia who presented with complaint of upper respiratory symptoms for 2 weeks with nonproductive cough, runny nose, congestion, decreased oral intake.  Also complained of diarrhea, epigastric abdominal pain.  Pt admitted 08/13/22 for the management of acute pancreatitis.  Clinical Impression  Patient evaluated by Physical Therapy with no further acute PT needs identified. All education has been completed and the patient has no further questions.  Pt reports weakness due to not eating well (not tolerating due to abdominal pain and diarrhea) since 12/25.  Pt able to ambulate in hallway with IV pole for support.  Pt agreeable to no further acute PT needs and very accepting of mobility specialists to check in and ensure he continues to mobilize for eventual return d/c home.  See below for any follow-up Physical Therapy or equipment needs. PT is signing off. Thank you for this referral.         Recommendations for follow up therapy are one component of a multi-disciplinary discharge planning process, led by the attending physician.  Recommendations may be updated based on patient status, additional functional criteria and insurance authorization.  Follow Up Recommendations No PT follow up      Assistance Recommended at Discharge PRN  Patient can return home with the following  Help with stairs or ramp for entrance    Equipment Recommendations None recommended by PT  Recommendations for Other Services       Functional Status Assessment Patient has not had a recent decline in their functional status     Precautions / Restrictions Precautions Precautions: Fall      Mobility  Bed Mobility Overal bed mobility: Modified Independent                   Transfers Overall transfer level: Needs assistance Equipment used: None Transfers: Sit to/from Stand Sit to Stand: Min guard, Supervision                Ambulation/Gait Ambulation/Gait assistance: Min guard, Supervision Gait Distance (Feet): 160 Feet Assistive device: IV Pole Gait Pattern/deviations: Step-through pattern, Decreased stride length, Trunk flexed       General Gait Details: pt pushed IV pole with both hands, reports he has access to RW if needed upon d/c, feels weak due to not being able to tolerate eating since 12/25 per his report  Stairs            Wheelchair Mobility    Modified Rankin (Stroke Patients Only)       Balance Overall balance assessment:  (reports "wobbly" history due to neuropathy, no recent falls)                                           Pertinent Vitals/Pain Pain Assessment Pain Assessment: No/denies pain    Home Living Family/patient expects to be discharged to:: Private residence Living Arrangements: Spouse/significant other   Type of Home: House Home Access: Stairs to enter   [employer] of Steps: 4   Home Layout: One level Home Equipment: Conservation officer, nature (2 wheels);Cane - single point      Prior Function Prior Level of Function : Independent/Modified Independent  Hand Dominance        Extremity/Trunk Assessment        Lower Extremity Assessment Lower Extremity Assessment: Generalized weakness;LLE deficits/detail;RLE deficits/detail RLE Sensation: history of peripheral neuropathy LLE Sensation: history of peripheral neuropathy    Cervical / Trunk Assessment Cervical / Trunk Assessment: Kyphotic  Communication   Communication: No difficulties  Cognition Arousal/Alertness: Awake/alert Behavior During Therapy: WFL for tasks assessed/performed Overall Cognitive Status: Within Functional Limits for tasks assessed                                           General Comments      Exercises     Assessment/Plan    PT Assessment Patient does not need any further PT services  PT Problem List Decreased mobility       PT Treatment Interventions      PT Goals (Current goals can be found in the Care Plan section)  Acute Rehab PT Goals PT Goal Formulation: All assessment and education complete, DC therapy    Frequency       Co-evaluation               AM-PAC PT "6 Clicks" Mobility  Outcome Measure Help needed turning from your back to your side while in a flat bed without using bedrails?: None Help needed moving from lying on your back to sitting on the side of a flat bed without using bedrails?: None Help needed moving to and from a bed to a chair (including a wheelchair)?: None Help needed standing up from a chair using your arms (e.g., wheelchair or bedside chair)?: None Help needed to walk in hospital room?: None Help needed climbing 3-5 steps with a railing? : A Little 6 Click Score: 23    End of Session Equipment Utilized During Treatment: Gait belt Activity Tolerance: Patient tolerated treatment well Patient left: in chair;with call bell/phone within reach;with chair alarm set Nurse Communication: Mobility status PT Visit Diagnosis: Difficulty in walking, not elsewhere classified (R26.2)    Time: 3244-0102 PT Time Calculation (min) (ACUTE ONLY): 12 min   Charges:   PT Evaluation $PT Eval Low Complexity: 1 Low        Kati PT, DPT Physical Therapist Acute Rehabilitation Services Preferred contact method: Secure Chat Weekend Pager Only: [phone] Office: [place] 08/15/2022, 3:10 PM

## 2022-08-15 NOTE — Progress Notes (Signed)
  Transition of Care [facility]) Screening Note   Patient Details  Name: Thomas Guerrero Date of Birth: 1945/09/27   Transition of Care [facility]) CM/SW Contact:    Vassie Moselle, LCSW Phone Number: 08/15/2022, 3:51 PM    Transition of Care Department [facility]) has reviewed patient and no TOC needs have been identified at this time. We will continue to monitor patient advancement through interdisciplinary progression rounds. If new patient transition needs arise, please place a TOC consult.

## 2022-08-16 DIAGNOSIS — K859 Acute pancreatitis without necrosis or infection, unspecified: Secondary | ICD-10-CM | POA: Diagnosis not present

## 2022-08-16 LAB — URINALYSIS, ROUTINE W REFLEX MICROSCOPIC
Bacteria, UA: NONE SEEN
Bilirubin Urine: NEGATIVE
Glucose, UA: NEGATIVE mg/dL
Hgb urine dipstick: NEGATIVE
Ketones, ur: 5 mg/dL — AB
Leukocytes,Ua: NEGATIVE
Nitrite: NEGATIVE
Protein, ur: NEGATIVE mg/dL
Specific Gravity, Urine: 1.012 (ref 1.005–1.030)
pH: 5 (ref 5.0–8.0)

## 2022-08-16 LAB — COMPREHENSIVE METABOLIC PANEL
ALT: 10 U/L (ref 0–44)
AST: 14 U/L — ABNORMAL LOW (ref 15–41)
Albumin: 2.6 g/dL — ABNORMAL LOW (ref 3.5–5.0)
Alkaline Phosphatase: 46 U/L (ref 38–126)
Anion gap: 9 (ref 5–15)
BUN: 31 mg/dL — ABNORMAL HIGH (ref 8–23)
CO2: 19 mmol/L — ABNORMAL LOW (ref 22–32)
Calcium: 9 mg/dL (ref 8.9–10.3)
Chloride: 106 mmol/L (ref 98–111)
Creatinine, Ser: 1.9 mg/dL — ABNORMAL HIGH (ref 0.61–1.24)
GFR, Estimated: 36 mL/min — ABNORMAL LOW (ref >60)
Glucose, Bld: 76 mg/dL (ref 70–99)
Potassium: 4.2 mmol/L (ref 3.5–5.1)
Sodium: 134 mmol/L — ABNORMAL LOW (ref 135–145)
Total Bilirubin: 1.2 mg/dL (ref 0.3–1.2)
Total Protein: 5.1 g/dL — ABNORMAL LOW (ref 6.5–8.1)

## 2022-08-16 LAB — COXSACKIE B VIRUS ANTIBODIES
Coxsackie B1 Ab: 1:100 {titer} — ABNORMAL HIGH
Coxsackie B2 Ab: 1:100 {titer} — ABNORMAL HIGH
Coxsackie B3 Ab: 1:100 {titer} — ABNORMAL HIGH
Coxsackie B4 Ab: 1:100 {titer} — ABNORMAL HIGH
Coxsackie B5 Ab: 1:100 {titer} — ABNORMAL HIGH
Coxsackie B6 Ab: 1:100 {titer} — ABNORMAL HIGH

## 2022-08-16 LAB — CBC
HCT: 27.8 % — ABNORMAL LOW (ref 39.0–52.0)
Hemoglobin: 9.2 g/dL — ABNORMAL LOW (ref 13.0–17.0)
MCH: 34.3 pg — ABNORMAL HIGH (ref 26.0–34.0)
MCHC: 33.1 g/dL (ref 30.0–36.0)
MCV: 103.7 fL — ABNORMAL HIGH (ref 80.0–100.0)
Platelets: 141 10*3/uL — ABNORMAL LOW (ref 150–400)
RBC: 2.68 MIL/uL — ABNORMAL LOW (ref 4.22–5.81)
RDW: 12.4 % (ref 11.5–15.5)
WBC: 6.8 10*3/uL (ref 4.0–10.5)
nRBC: 0 % (ref 0.0–0.2)

## 2022-08-16 MED ORDER — HYDROCORTISONE 1 % EX CREA
1.0000 | TOPICAL_CREAM | Freq: Three times a day (TID) | CUTANEOUS | Status: DC | PRN
  Administered 2022-08-16: 1 via TOPICAL
  Filled 2022-08-16: qty 28

## 2022-08-16 NOTE — Progress Notes (Signed)
Mobility Specialist - Progress Note   08/16/22 1521  Mobility  Activity Ambulated with assistance in hallway  Level of Assistance Contact guard assist, steadying assist  Assistive Device Other (Comment) (IV Pole)  Distance Ambulated (ft) 250 ft  Activity Response Tolerated well  Mobility Referral Yes  $Mobility charge 1 Mobility   Pt received in bed and agreeable to mobility. Pt had some LOB due to neuropathy in feet which was self corrected. No other complaints during mobility. Pt to bed after session with all needs met.  [facility]

## 2022-08-16 NOTE — Progress Notes (Signed)
PROGRESS NOTE  Thomas Guerrero  KDI:[id number] DOB: [birth year] DOA: 08/13/2022 PCP: Maurice Small, MD   Brief Narrative: Patient is a 77 year old male with history of CKD stage IV, MGUS, hypertension, prostate cancer, hyperlipidemia who presented with complaint of upper respiratory symptoms for 2 weeks with nonproductive cough, runny nose, congestion, decreased oral intake.  Also complained of diarrhea, epigastric abdominal pain.  Imagings of  abdomen on presentation suspicious for pancreatitis, elevated lipase.  Admitted for the management of acute pancreatitis.  Further imaging showed duodenitis/pancreatitis.  Started on antibiotics.  Diet advanced to soft today.  Plan for discharge home tomorrow if remains stable  Assessment & Plan:  Principal Problem:   Acute pancreatitis Active Problems:   CKD (chronic kidney disease) stage 4, GFR 15-29 ml/min (HCC)   Metabolic acidosis, NAG, bicarbonate losses   Prostate cancer (HCC)   HTN (hypertension)  Acute pancreatitis/duodenitis: Presented with decreased oral intake, epigastric abdominal pain.MRCP showed circumferential wall thickening of the descending portion of the duodenum suspicious for  infectious or inflammatory duodenitis, including peptic ulcer disease, or alternately groove pancreatitis,minimal inflammatory fat stranding about the pancreatic head. GI pathogen panel, C. difficile ordered but he does not have diarrhea. Continue Zosyn for now because of finding of duodenitis though it could be viral. No fever or leukocytosis, will change antibiotics to oral on discharge. Abdomen pain is significantly improved today.  No nausea or vomiting or diarrhea.  Diet advanced to soft.  CKD stage 3b-IV: Also has history of MGUS.  Baseline creatinine around 2.  Currently kidney function at baseline.  Follows with [facility].  Hypertension: Continue amlodipine, lisinopril  History of prostate cancer: Follows with urology.  Abnormal MRI: MRCP  showed  Incidental note of a probable solid mass of the inferior pole of the right kidney measuring 2.4 x 2.1 cm concerning for renal cell carcinoma.Multiphasic contrast enhanced CT or MRI recommended to fully characterize when appropriate. No evidence of lymphadenopathy or metastatic disease in the abdomen on noncontrast examination. We strongly recommend patient to follow-up with urology as an outpatient and proceed with further workup.  Weakness: Lives with wife.  PT did not recommend any follow-up.      DVT prophylaxis:enoxaparin (LOVENOX) injection 40 mg Start: 08/14/22 1000     Code Status: Full Code  Family Communication: Called and discussed with wife on phone on 1/9  Patient status:Inpatient  Patient is from :Home  Anticipated discharge SY:IDIU  Estimated DC date: Tomorrow   Consultants: None  Procedures:None  Antimicrobials:  Anti-infectives (From admission, onward)    Start     Dose/Rate Route Frequency Ordered Stop   08/16/22 0200  piperacillin-tazobactam (ZOSYN) IVPB 3.375 g        3.375 g 12.5 mL/hr over 240 Minutes Intravenous Every 8 hours 08/15/22 2240     08/15/22 1500  piperacillin-tazobactam (ZOSYN) IVPB 3.375 g  Status:  Discontinued        3.375 g 12.5 mL/hr over 240 Minutes Intravenous Every 8 hours 08/15/22 1447 08/15/22 2240       Subjective: Patient seen and the bedside today.  Hemodynamically stable.  She feels much better today.  No nausea, vomiting or diarrhea.  Abdomen pain has resolved.  He wants to advance the diet.  Objective: Vitals:   08/14/22 2100 08/15/22 0515 08/15/22 1422 08/15/22 1920  BP: (!) 148/58 (!) 152/67 (!) 129/54 (!) 136/59  Pulse: 79 63 70 64  Resp: 16 18 14 16   Temp: 98.9 F (37.2 C) 97.6 F (36.4 C) 97.9  F (36.6 C) 97.8 F (36.6 C)  TempSrc: Oral Oral Oral Oral  SpO2: 98% 97% 98% 98%  Weight:      Height:        Intake/Output Summary (Last 24 hours) at 08/16/2022 1244 Last data filed at 08/16/2022  1049 Gross per 24 hour  Intake 4670.28 ml  Output --  Net 4670.28 ml   Filed Weights   08/14/22 0139  Weight: 87.3 kg    Examination:  General exam: Overall comfortable, not in distress HEENT: PERRL,dry mouth Respiratory system:  no wheezes or crackles  Cardiovascular system: S1 & S2 heard, RRR.  Gastrointestinal system: Abdomen is nondistended, soft .  Tenderness in the epigastrium Central nervous system: Alert and oriented Extremities: No edema, no clubbing ,no cyanosis Skin: No rashes, no ulcers,no icterus     Data Reviewed: I have personally reviewed following labs and imaging studies  CBC: Recent Labs  Lab 08/13/22 2109 08/14/22 0557 08/15/22 0600 08/16/22 0550  WBC 11.7* 9.3 7.3 6.8  HGB 12.2* 10.3* 9.3* 9.2*  HCT 36.4* 30.8* 28.4* 27.8*  MCV 100.8* 102.3* 104.0* 103.7*  PLT 246 177 154 132*   Basic Metabolic Panel: Recent Labs  Lab 08/13/22 2109 08/14/22 0557 08/15/22 0600 08/16/22 0550  NA 133* 134* 133* 134*  K 4.7 4.3 4.1 4.2  CL 105 107 106 106  CO2 17* 19* 18* 19*  GLUCOSE 106* 99 88 76  BUN 48* 42* 38* 31*  CREATININE 2.06* 1.89* 1.77* 1.90*  CALCIUM 10.1 9.3 8.9 9.0  MG  --   --  2.0  --      Recent Results (from the past 240 hour(s))  Resp panel by RT-PCR (RSV, Flu A&B, Covid) Anterior Nasal Swab     Status: None   Collection Time: 08/13/22  8:37 PM   Specimen: Anterior Nasal Swab  Result Value Ref Range Status   SARS Coronavirus 2 by RT PCR NEGATIVE NEGATIVE Final    Comment: (NOTE) SARS-CoV-2 target nucleic acids are NOT DETECTED.  The SARS-CoV-2 RNA is generally detectable in upper respiratory specimens during the acute phase of infection. The lowest concentration of SARS-CoV-2 viral copies this assay can detect is 138 copies/mL. A negative result does not preclude SARS-Cov-2 infection and should not be used as the sole basis for treatment or other patient management decisions. A negative result may occur with  improper  specimen collection/handling, submission of specimen other than nasopharyngeal swab, presence of viral mutation(s) within the areas targeted by this assay, and inadequate number of viral copies(<138 copies/mL). A negative result must be combined with clinical observations, patient history, and epidemiological information. The expected result is Negative.  Fact Sheet for Patients:  EntrepreneurPulse.com.au  Fact Sheet for Healthcare Providers:  IncredibleEmployment.be  This test is no t yet approved or cleared by the [place] FDA and  has been authorized for detection and/or diagnosis of SARS-CoV-2 by FDA under an Emergency Use Authorization (EUA). This EUA will remain  in effect (meaning this test can be used) for the duration of the COVID-19 declaration under Section 564(b)(1) of the Act, 21 U.S.C.section 360bbb-3(b)(1), unless the authorization is terminated  or revoked sooner.       Influenza A by PCR NEGATIVE NEGATIVE Final   Influenza B by PCR NEGATIVE NEGATIVE Final    Comment: (NOTE) The Xpert Xpress SARS-CoV-2/FLU/RSV plus assay is intended as an aid in the diagnosis of influenza from Nasopharyngeal swab specimens and should not be used as a sole basis for  treatment. Nasal washings and aspirates are unacceptable for Xpert Xpress SARS-CoV-2/FLU/RSV testing.  Fact Sheet for Patients: EntrepreneurPulse.com.au  Fact Sheet for Healthcare Providers: IncredibleEmployment.be  This test is not yet approved or cleared by the [place] FDA and has been authorized for detection and/or diagnosis of SARS-CoV-2 by FDA under an Emergency Use Authorization (EUA). This EUA will remain in effect (meaning this test can be used) for the duration of the COVID-19 declaration under Section 564(b)(1) of the Act, 21 U.S.C. section 360bbb-3(b)(1), unless the authorization is terminated or revoked.     Resp  Syncytial Virus by PCR NEGATIVE NEGATIVE Final    Comment: (NOTE) Fact Sheet for Patients: EntrepreneurPulse.com.au  Fact Sheet for Healthcare Providers: IncredibleEmployment.be  This test is not yet approved or cleared by the [place] FDA and has been authorized for detection and/or diagnosis of SARS-CoV-2 by FDA under an Emergency Use Authorization (EUA). This EUA will remain in effect (meaning this test can be used) for the duration of the COVID-19 declaration under Section 564(b)(1) of the Act, 21 U.S.C. section 360bbb-3(b)(1), unless the authorization is terminated or revoked.  Performed at [facility], [place] [address] [place], [place] [address]   Respiratory (~20 pathogens) panel by PCR     Status: None   Collection Time: 08/13/22  8:37 PM   Specimen: Nasopharyngeal Swab; Respiratory  Result Value Ref Range Status   Adenovirus NOT DETECTED NOT DETECTED Final   Coronavirus 229E NOT DETECTED NOT DETECTED Final    Comment: (NOTE) The Coronavirus on the Respiratory Panel, DOES NOT test for the novel  Coronavirus (2019 nCoV)    Coronavirus HKU1 NOT DETECTED NOT DETECTED Final   Coronavirus NL63 NOT DETECTED NOT DETECTED Final   Coronavirus OC43 NOT DETECTED NOT DETECTED Final   Metapneumovirus NOT DETECTED NOT DETECTED Final   Rhinovirus / Enterovirus NOT DETECTED NOT DETECTED Final   Influenza A NOT DETECTED NOT DETECTED Final   Influenza B NOT DETECTED NOT DETECTED Final   Parainfluenza Virus 1 NOT DETECTED NOT DETECTED Final   Parainfluenza Virus 2 NOT DETECTED NOT DETECTED Final   Parainfluenza Virus 3 NOT DETECTED NOT DETECTED Final   Parainfluenza Virus 4 NOT DETECTED NOT DETECTED Final   Respiratory Syncytial Virus NOT DETECTED NOT DETECTED Final   Bordetella pertussis NOT DETECTED NOT DETECTED Final   Bordetella Parapertussis NOT DETECTED NOT DETECTED Final   Chlamydophila pneumoniae NOT DETECTED NOT  DETECTED Final   Mycoplasma pneumoniae NOT DETECTED NOT DETECTED Final    Comment: Performed at [facility], [place]. [address], [place], [place] [address]     Radiology Studies: MR ABDOMEN MRCP WO CONTRAST  Result Date: 08/14/2022 CLINICAL DATA:  Pancreatitis suspected, abdominal pain, nausea and vomiting EXAM: MRI ABDOMEN WITHOUT CONTRAST  (INCLUDING MRCP) TECHNIQUE: Multiplanar multisequence MR imaging of the abdomen was performed. Heavily T2-weighted images of the biliary and pancreatic ducts were obtained, and three-dimensional MRCP images were rendered by post processing. COMPARISON:  CT abdomen pelvis, 08/13/2022, MR abdomen, 08/27/2018 FINDINGS: Lower chest: No acute abnormality. Hepatobiliary: No solid liver abnormality is seen. Probable sludge ball in the gallbladder (series 4, image 19). No gallstones, gallbladder wall thickening, or biliary dilatation. Pancreas: Minimal inflammatory fat stranding about the pancreatic head adjacent to the descending duodenum. No pancreatic ductal dilatation or other inflammatory findings about the pancreas. Spleen: Normal in size without significant abnormality. Adrenals/Urinary Tract: Adrenal glands are unremarkable. Incidental note of a probable solid mass of the inferior pole of the right  kidney measuring 2.4 x 2.1 cm (series 3, image 15). Multiple additional simple appearing bilateral renal cortical and parapelvic cysts, benign. Stomach/Bowel: Stomach is within normal limits. Circumferential wall thickening of the descending portion of the duodenum (series 5, image 23). Small amount of adjacent inflammatory fat stranding. Vascular/Lymphatic: No significant vascular findings are present. No enlarged abdominal lymph nodes. Other: No abdominal wall hernia or abnormality. No ascites. Musculoskeletal: No acute or significant osseous findings. IMPRESSION: 1. Circumferential wall thickening of the descending portion of the duodenum, with small amount of adjacent  inflammatory fat stranding. This may reflect infectious or inflammatory duodenitis, including peptic ulcer disease, or alternately groove pancreatitis. 2. Minimal inflammatory fat stranding about the pancreatic head adjacent to the descending duodenum, as above. No pancreatic ductal dilatation or other inflammatory findings about the remainder of the pancreas. 3. Probable sludge ball in the gallbladder. No gallstones, gallbladder wall thickening, or biliary dilatation. 4. Incidental note of a probable solid mass of the inferior pole of the right kidney measuring 2.4 x 2.1 cm concerning for renal cell carcinoma. Recommend multiphasic contrast enhanced CT or MRI to fully characterize. No evidence of lymphadenopathy or metastatic disease in the abdomen on noncontrast examination. Electronically Signed   By: Delanna Ahmadi M.D.   On: 08/14/2022 16:48    Scheduled Meds:  allopurinol  100 mg Oral Daily   amLODipine  5 mg Oral Daily   enoxaparin (LOVENOX) injection  40 mg Subcutaneous Q24H   ferrous sulfate  325 mg Oral Q breakfast   gabapentin  300 mg Oral BID   lisinopril  20 mg Oral Daily   pantoprazole  40 mg Oral BID   Continuous Infusions:  lactated ringers 100 mL/hr at 08/16/22 0615   piperacillin-tazobactam (ZOSYN)  IV 3.375 g (08/16/22 0944)     LOS: 2 days   Shelly Coss, MD [facility] P1/04/2023, 12:44 PM  PROGRESS NOTE  Shaheem Pichon  VZD:[id number] DOB: Feb 11, 1946 DOA: 08/13/2022 PCP: [name], MD   Brief Narrative:   Assessment & Plan:  Principal Problem:   Acute pancreatitis Active Problems:   CKD (chronic kidney disease) stage 4, GFR 15-29 ml/min (HCC)   Metabolic acidosis, NAG, bicarbonate losses   Prostate cancer ([place])   HTN (hypertension)           DVT prophylaxis:enoxaparin (LOVENOX) injection 40 mg Start: 08/14/22 1000     Code Status: Full Code  Family Communication:   Patient status:  Patient is from :  Anticipated discharge  to:  Estimated DC date:   Consultants:   Procedures:  Antimicrobials:  Anti-infectives (From admission, onward)    Start     Dose/Rate Route Frequency Ordered Stop   08/16/22 0200  piperacillin-tazobactam (ZOSYN) IVPB 3.375 g        3.375 g 12.5 mL/hr over 240 Minutes Intravenous Every 8 hours 08/15/22 2240     08/15/22 1500  piperacillin-tazobactam (ZOSYN) IVPB 3.375 g  Status:  Discontinued        3.375 g 12.5 mL/hr over 240 Minutes Intravenous Every 8 hours 08/15/22 1447 08/15/22 2240       Subjective:   Objective: Vitals:   08/14/22 2100 08/15/22 0515 08/15/22 1422 08/15/22 1920  BP: (!) 148/58 (!) 152/67 (!) 129/54 (!) 136/59  Pulse: 79 63 70 64  Resp: $Remo'16 18 14 16  'ZZtch$ Temp: 98.9 F (37.2 C) 97.6 F (36.4 C) 97.9 F (36.6 C) 97.8 F (36.6 C)  TempSrc: Oral Oral Oral Oral  SpO2: 98% 97% 98% 98%  Weight:      Height:        Intake/Output Summary (Last 24 hours) at 08/16/2022 1244 Last data filed at 08/16/2022 1049 Gross per 24 hour  Intake 4670.28 ml  Output --  Net 4670.28 ml   Filed Weights   08/14/22 0139  Weight: 87.3 kg    Examination:  General exam: Overall comfortable, not in distress HEENT: PERRL Respiratory system:  no wheezes or crackles  Cardiovascular system: S1 & S2 heard, RRR.  Gastrointestinal system: Abdomen is nondistended, soft and nontender. Central nervous system: Alert and oriented Extremities: No edema, no clubbing ,no cyanosis Skin: No rashes, no ulcers,no icterus     Data Reviewed: I have personally reviewed following labs and imaging studies  CBC: Recent Labs  Lab 08/13/22 2109 08/14/22 0557 08/15/22 0600 08/16/22 0550  WBC 11.7* 9.3 7.3 6.8  HGB 12.2* 10.3* 9.3* 9.2*  HCT 36.4* 30.8* 28.4* 27.8*  MCV 100.8* 102.3* 104.0* 103.7*  PLT 246 177 154 242*   Basic Metabolic Panel: Recent Labs  Lab 08/13/22 2109 08/14/22 0557 08/15/22 0600 08/16/22 0550  NA 133* 134* 133* 134*  K 4.7 4.3 4.1 4.2  CL 105 107 106  106  CO2 17* 19* 18* 19*  GLUCOSE 106* 99 88 76  BUN 48* 42* 38* 31*  CREATININE 2.06* 1.89* 1.77* 1.90*  CALCIUM 10.1 9.3 8.9 9.0  MG  --   --  2.0  --      Recent Results (from the past 240 hour(s))  Resp panel by RT-PCR (RSV, Flu A&B, Covid) Anterior Nasal Swab     Status: None   Collection Time: 08/13/22  8:37 PM   Specimen: Anterior Nasal Swab  Result Value Ref Range Status   SARS Coronavirus 2 by RT PCR NEGATIVE NEGATIVE Final    Comment: (NOTE) SARS-CoV-2 target nucleic acids are NOT DETECTED.  The SARS-CoV-2 RNA is generally detectable in upper respiratory specimens during the acute phase of infection. The lowest concentration of SARS-CoV-2 viral copies this assay can detect is 138 copies/mL. A negative result does not preclude SARS-Cov-2 infection and should not be used as the sole basis for treatment or other patient management decisions. A negative result may occur with  improper specimen collection/handling, submission of specimen other than nasopharyngeal swab, presence of viral mutation(s) within the areas targeted by this assay, and inadequate number of viral copies(<138 copies/mL). A negative result must be combined with clinical observations, patient history, and epidemiological information. The expected result is Negative.  Fact Sheet for Patients:  EntrepreneurPulse.com.au  Fact Sheet for Healthcare Providers:  IncredibleEmployment.be  This test is no t yet approved or cleared by the [place] FDA and  has been authorized for detection and/or diagnosis of SARS-CoV-2 by FDA under an Emergency Use Authorization (EUA). This EUA will remain  in effect (meaning this test can be used) for the duration of the COVID-19 declaration under Section 564(b)(1) of the Act, 21 U.S.C.section 360bbb-3(b)(1), unless the authorization is terminated  or revoked sooner.       Influenza A by PCR NEGATIVE NEGATIVE Final   Influenza B  by PCR NEGATIVE NEGATIVE Final    Comment: (NOTE) The Xpert Xpress SARS-CoV-2/FLU/RSV plus assay is intended as an aid in the diagnosis of influenza from Nasopharyngeal swab specimens and should not be used as a sole basis for treatment. Nasal washings and aspirates are unacceptable for Xpert Xpress SARS-CoV-2/FLU/RSV testing.  Fact Sheet for Patients: EntrepreneurPulse.com.au  Fact Sheet for Healthcare Providers: IncredibleEmployment.be  This test is not yet approved or cleared by the [place] and has been authorized for detection and/or diagnosis of SARS-CoV-2 by FDA under an Emergency Use Authorization (EUA). This EUA will remain in effect (meaning this test can be used) for the duration of the COVID-19 declaration under Section 564(b)(1) of the Act, 21 U.S.C. section 360bbb-3(b)(1), unless the authorization is terminated or revoked.     Resp Syncytial Virus by PCR NEGATIVE NEGATIVE Final    Comment: (NOTE) Fact Sheet for Patients: EntrepreneurPulse.com.au  Fact Sheet for Healthcare Providers: IncredibleEmployment.be  This test is not yet approved or cleared by the [place] FDA and has been authorized for detection and/or diagnosis of SARS-CoV-2 by FDA under an Emergency Use Authorization (EUA). This EUA will remain in effect (meaning this test can be used) for the duration of the COVID-19 declaration under Section 564(b)(1) of the Act, 21 U.S.C. section 360bbb-3(b)(1), unless the authorization is terminated or revoked.  Performed at [facility], [place] [address] [place], [place] [address]   Respiratory (~20 pathogens) panel by PCR     Status: None   Collection Time: 08/13/22  8:37 PM   Specimen: Nasopharyngeal Swab; Respiratory  Result Value Ref Range Status   Adenovirus NOT DETECTED NOT DETECTED Final   Coronavirus 229E NOT DETECTED NOT DETECTED Final    Comment:  (NOTE) The Coronavirus on the Respiratory Panel, DOES NOT test for the novel  Coronavirus (2019 nCoV)    Coronavirus HKU1 NOT DETECTED NOT DETECTED Final   Coronavirus NL63 NOT DETECTED NOT DETECTED Final   Coronavirus OC43 NOT DETECTED NOT DETECTED Final   Metapneumovirus NOT DETECTED NOT DETECTED Final   Rhinovirus / Enterovirus NOT DETECTED NOT DETECTED Final   Influenza A NOT DETECTED NOT DETECTED Final   Influenza B NOT DETECTED NOT DETECTED Final   Parainfluenza Virus 1 NOT DETECTED NOT DETECTED Final   Parainfluenza Virus 2 NOT DETECTED NOT DETECTED Final   Parainfluenza Virus 3 NOT DETECTED NOT DETECTED Final   Parainfluenza Virus 4 NOT DETECTED NOT DETECTED Final   Respiratory Syncytial Virus NOT DETECTED NOT DETECTED Final   Bordetella pertussis NOT DETECTED NOT DETECTED Final   Bordetella Parapertussis NOT DETECTED NOT DETECTED Final   Chlamydophila pneumoniae NOT DETECTED NOT DETECTED Final   Mycoplasma pneumoniae NOT DETECTED NOT DETECTED Final    Comment: Performed at [facility], [place]. [address] [place], [place] [address]     Radiology Studies: MR ABDOMEN MRCP WO CONTRAST  Result Date: 08/14/2022 CLINICAL DATA:  Pancreatitis suspected, abdominal pain, nausea and vomiting EXAM: MRI ABDOMEN WITHOUT CONTRAST  (INCLUDING MRCP) TECHNIQUE: Multiplanar multisequence MR imaging of the abdomen was performed. Heavily T2-weighted images of the biliary and pancreatic ducts were obtained, and three-dimensional MRCP images were rendered by post processing. COMPARISON:  CT abdomen pelvis, 08/13/2022, MR abdomen, 08/27/2018 FINDINGS: Lower chest: No acute abnormality. Hepatobiliary: No solid liver abnormality is seen. Probable sludge ball in the gallbladder (series 4, image 19). No gallstones, gallbladder wall thickening, or biliary dilatation. Pancreas: Minimal inflammatory fat stranding about the pancreatic head adjacent to the descending duodenum. No pancreatic ductal dilatation or  other inflammatory findings about the pancreas. Spleen: Normal in size without significant abnormality. Adrenals/Urinary Tract: Adrenal glands are unremarkable. Incidental note of a probable solid mass of the inferior pole of the right kidney measuring 2.4 x 2.1 cm (series 3, image 15). Multiple additional simple appearing bilateral renal cortical and parapelvic cysts, benign. Stomach/Bowel: Stomach is within normal  limits. Circumferential wall thickening of the descending portion of the duodenum (series 5, image 23). Small amount of adjacent inflammatory fat stranding. Vascular/Lymphatic: No significant vascular findings are present. No enlarged abdominal lymph nodes. Other: No abdominal wall hernia or abnormality. No ascites. Musculoskeletal: No acute or significant osseous findings. IMPRESSION: 1. Circumferential wall thickening of the descending portion of the duodenum, with small amount of adjacent inflammatory fat stranding. This may reflect infectious or inflammatory duodenitis, including peptic ulcer disease, or alternately groove pancreatitis. 2. Minimal inflammatory fat stranding about the pancreatic head adjacent to the descending duodenum, as above. No pancreatic ductal dilatation or other inflammatory findings about the remainder of the pancreas. 3. Probable sludge ball in the gallbladder. No gallstones, gallbladder wall thickening, or biliary dilatation. 4. Incidental note of a probable solid mass of the inferior pole of the right kidney measuring 2.4 x 2.1 cm concerning for renal cell carcinoma. Recommend multiphasic contrast enhanced CT or MRI to fully characterize. No evidence of lymphadenopathy or metastatic disease in the abdomen on noncontrast examination. Electronically Signed   By: Delanna Ahmadi M.D.   On: 08/14/2022 16:48    Scheduled Meds:  allopurinol  100 mg Oral Daily   amLODipine  5 mg Oral Daily   enoxaparin (LOVENOX) injection  40 mg Subcutaneous Q24H   ferrous sulfate  325 mg  Oral Q breakfast   gabapentin  300 mg Oral BID   lisinopril  20 mg Oral Daily   pantoprazole  40 mg Oral BID   Continuous Infusions:  lactated ringers 100 mL/hr at 08/16/22 0615   piperacillin-tazobactam (ZOSYN)  IV 3.375 g (08/16/22 0944)     LOS: 2 days   Shelly Coss, MD [facility] P1/04/2023, 12:44 PM  PROGRESS NOTE  Fadil Macmaster  SWN:[id number] DOB: 1946-03-08 DOA: 08/13/2022 PCP: [name], MD   Brief Narrative:   Assessment & Plan:  Principal Problem:   Acute pancreatitis Active Problems:   CKD (chronic kidney disease) stage 4, GFR 15-29 ml/min (HCC)   Metabolic acidosis, NAG, bicarbonate losses   Prostate cancer (HCC)   HTN (hypertension)           DVT prophylaxis:enoxaparin (LOVENOX) injection 40 mg Start: 08/14/22 1000     Code Status: Full Code  Family Communication:   Patient status:  Patient is from :  Anticipated discharge to:  Estimated DC date:   Consultants:   Procedures:  Antimicrobials:  Anti-infectives (From admission, onward)    Start     Dose/Rate Route Frequency Ordered Stop   08/16/22 0200  piperacillin-tazobactam (ZOSYN) IVPB 3.375 g        3.375 g 12.5 mL/hr over 240 Minutes Intravenous Every 8 hours 08/15/22 2240     08/15/22 1500  piperacillin-tazobactam (ZOSYN) IVPB 3.375 g  Status:  Discontinued        3.375 g 12.5 mL/hr over 240 Minutes Intravenous Every 8 hours 08/15/22 1447 08/15/22 2240       Subjective:   Objective: Vitals:   08/14/22 2100 08/15/22 0515 08/15/22 1422 08/15/22 1920  BP: (!) 148/58 (!) 152/67 (!) 129/54 (!) 136/59  Pulse: 79 63 70 64  Resp: $Remo'16 18 14 16  'hwQfR$ Temp: 98.9 F (37.2 C) 97.6 F (36.4 C) 97.9 F (36.6 C) 97.8 F (36.6 C)  TempSrc: Oral Oral Oral Oral  SpO2: 98% 97% 98% 98%  Weight:      Height:        Intake/Output Summary (Last 24 hours) at 08/16/2022 1244 Last data filed at  08/16/2022 1049 Gross per 24 hour  Intake 4670.28 ml  Output --  Net 4670.28 ml    Filed Weights   08/14/22 0139  Weight: 87.3 kg    Examination:  General exam: Overall comfortable, not in distress HEENT: PERRL Respiratory system:  no wheezes or crackles  Cardiovascular system: S1 & S2 heard, RRR.  Gastrointestinal system: Abdomen is nondistended, soft and nontender. Central nervous system: Alert and oriented Extremities: No edema, no clubbing ,no cyanosis Skin: No rashes, no ulcers,no icterus     Data Reviewed: I have personally reviewed following labs and imaging studies  CBC: Recent Labs  Lab 08/13/22 2109 08/14/22 0557 08/15/22 0600 08/16/22 0550  WBC 11.7* 9.3 7.3 6.8  HGB 12.2* 10.3* 9.3* 9.2*  HCT 36.4* 30.8* 28.4* 27.8*  MCV 100.8* 102.3* 104.0* 103.7*  PLT 246 177 154 583*   Basic Metabolic Panel: Recent Labs  Lab 08/13/22 2109 08/14/22 0557 08/15/22 0600 08/16/22 0550  NA 133* 134* 133* 134*  K 4.7 4.3 4.1 4.2  CL 105 107 106 106  CO2 17* 19* 18* 19*  GLUCOSE 106* 99 88 76  BUN 48* 42* 38* 31*  CREATININE 2.06* 1.89* 1.77* 1.90*  CALCIUM 10.1 9.3 8.9 9.0  MG  --   --  2.0  --      Recent Results (from the past 240 hour(s))  Resp panel by RT-PCR (RSV, Flu A&B, Covid) Anterior Nasal Swab     Status: None   Collection Time: 08/13/22  8:37 PM   Specimen: Anterior Nasal Swab  Result Value Ref Range Status   SARS Coronavirus 2 by RT PCR NEGATIVE NEGATIVE Final    Comment: (NOTE) SARS-CoV-2 target nucleic acids are NOT DETECTED.  The SARS-CoV-2 RNA is generally detectable in upper respiratory specimens during the acute phase of infection. The lowest concentration of SARS-CoV-2 viral copies this assay can detect is 138 copies/mL. A negative result does not preclude SARS-Cov-2 infection and should not be used as the sole basis for treatment or other patient management decisions. A negative result may occur with  improper specimen collection/handling, submission of specimen other than nasopharyngeal swab, presence of viral  mutation(s) within the areas targeted by this assay, and inadequate number of viral copies(<138 copies/mL). A negative result must be combined with clinical observations, patient history, and epidemiological information. The expected result is Negative.  Fact Sheet for Patients:  EntrepreneurPulse.com.au  Fact Sheet for Healthcare Providers:  IncredibleEmployment.be  This test is no t yet approved or cleared by the [place] FDA and  has been authorized for detection and/or diagnosis of SARS-CoV-2 by FDA under an Emergency Use Authorization (EUA). This EUA will remain  in effect (meaning this test can be used) for the duration of the COVID-19 declaration under Section 564(b)(1) of the Act, 21 U.S.C.section 360bbb-3(b)(1), unless the authorization is terminated  or revoked sooner.       Influenza A by PCR NEGATIVE NEGATIVE Final   Influenza B by PCR NEGATIVE NEGATIVE Final    Comment: (NOTE) The Xpert Xpress SARS-CoV-2/FLU/RSV plus assay is intended as an aid in the diagnosis of influenza from Nasopharyngeal swab specimens and should not be used as a sole basis for treatment. Nasal washings and aspirates are unacceptable for Xpert Xpress SARS-CoV-2/FLU/RSV testing.  Fact Sheet for Patients: EntrepreneurPulse.com.au  Fact Sheet for Healthcare Providers: IncredibleEmployment.be  This test is not yet approved or cleared by the [place] FDA and has been authorized for detection and/or diagnosis of SARS-CoV-2 by FDA  under an Emergency Use Authorization (EUA). This EUA will remain in effect (meaning this test can be used) for the duration of the COVID-19 declaration under Section 564(b)(1) of the Act, 21 U.S.C. section 360bbb-3(b)(1), unless the authorization is terminated or revoked.     Resp Syncytial Virus by PCR NEGATIVE NEGATIVE Final    Comment: (NOTE) Fact Sheet for  Patients: EntrepreneurPulse.com.au  Fact Sheet for Healthcare Providers: IncredibleEmployment.be  This test is not yet approved or cleared by the [place] FDA and has been authorized for detection and/or diagnosis of SARS-CoV-2 by FDA under an Emergency Use Authorization (EUA). This EUA will remain in effect (meaning this test can be used) for the duration of the COVID-19 declaration under Section 564(b)(1) of the Act, 21 U.S.C. section 360bbb-3(b)(1), unless the authorization is terminated or revoked.  Performed at [facility], [place] [address] [place], [place] [address]   Respiratory (~20 pathogens) panel by PCR     Status: None   Collection Time: 08/13/22  8:37 PM   Specimen: Nasopharyngeal Swab; Respiratory  Result Value Ref Range Status   Adenovirus NOT DETECTED NOT DETECTED Final   Coronavirus 229E NOT DETECTED NOT DETECTED Final    Comment: (NOTE) The Coronavirus on the Respiratory Panel, DOES NOT test for the novel  Coronavirus (2019 nCoV)    Coronavirus HKU1 NOT DETECTED NOT DETECTED Final   Coronavirus NL63 NOT DETECTED NOT DETECTED Final   Coronavirus OC43 NOT DETECTED NOT DETECTED Final   Metapneumovirus NOT DETECTED NOT DETECTED Final   Rhinovirus / Enterovirus NOT DETECTED NOT DETECTED Final   Influenza A NOT DETECTED NOT DETECTED Final   Influenza B NOT DETECTED NOT DETECTED Final   Parainfluenza Virus 1 NOT DETECTED NOT DETECTED Final   Parainfluenza Virus 2 NOT DETECTED NOT DETECTED Final   Parainfluenza Virus 3 NOT DETECTED NOT DETECTED Final   Parainfluenza Virus 4 NOT DETECTED NOT DETECTED Final   Respiratory Syncytial Virus NOT DETECTED NOT DETECTED Final   Bordetella pertussis NOT DETECTED NOT DETECTED Final   Bordetella Parapertussis NOT DETECTED NOT DETECTED Final   Chlamydophila pneumoniae NOT DETECTED NOT DETECTED Final   Mycoplasma pneumoniae NOT DETECTED NOT DETECTED Final    Comment:  Performed at [facility], [place]. [address], [place], [place] [address]     Radiology Studies: MR ABDOMEN MRCP WO CONTRAST  Result Date: 08/14/2022 CLINICAL DATA:  Pancreatitis suspected, abdominal pain, nausea and vomiting EXAM: MRI ABDOMEN WITHOUT CONTRAST  (INCLUDING MRCP) TECHNIQUE: Multiplanar multisequence MR imaging of the abdomen was performed. Heavily T2-weighted images of the biliary and pancreatic ducts were obtained, and three-dimensional MRCP images were rendered by post processing. COMPARISON:  CT abdomen pelvis, 08/13/2022, MR abdomen, 08/27/2018 FINDINGS: Lower chest: No acute abnormality. Hepatobiliary: No solid liver abnormality is seen. Probable sludge ball in the gallbladder (series 4, image 19). No gallstones, gallbladder wall thickening, or biliary dilatation. Pancreas: Minimal inflammatory fat stranding about the pancreatic head adjacent to the descending duodenum. No pancreatic ductal dilatation or other inflammatory findings about the pancreas. Spleen: Normal in size without significant abnormality. Adrenals/Urinary Tract: Adrenal glands are unremarkable. Incidental note of a probable solid mass of the inferior pole of the right kidney measuring 2.4 x 2.1 cm (series 3, image 15). Multiple additional simple appearing bilateral renal cortical and parapelvic cysts, benign. Stomach/Bowel: Stomach is within normal limits. Circumferential wall thickening of the descending portion of the duodenum (series 5, image 23). Small amount of adjacent inflammatory fat stranding. Vascular/Lymphatic: No significant  vascular findings are present. No enlarged abdominal lymph nodes. Other: No abdominal wall hernia or abnormality. No ascites. Musculoskeletal: No acute or significant osseous findings. IMPRESSION: 1. Circumferential wall thickening of the descending portion of the duodenum, with small amount of adjacent inflammatory fat stranding. This may reflect infectious or inflammatory duodenitis,  including peptic ulcer disease, or alternately groove pancreatitis. 2. Minimal inflammatory fat stranding about the pancreatic head adjacent to the descending duodenum, as above. No pancreatic ductal dilatation or other inflammatory findings about the remainder of the pancreas. 3. Probable sludge ball in the gallbladder. No gallstones, gallbladder wall thickening, or biliary dilatation. 4. Incidental note of a probable solid mass of the inferior pole of the right kidney measuring 2.4 x 2.1 cm concerning for renal cell carcinoma. Recommend multiphasic contrast enhanced CT or MRI to fully characterize. No evidence of lymphadenopathy or metastatic disease in the abdomen on noncontrast examination. Electronically Signed   By: Delanna Ahmadi M.D.   On: 08/14/2022 16:48    Scheduled Meds:  allopurinol  100 mg Oral Daily   amLODipine  5 mg Oral Daily   enoxaparin (LOVENOX) injection  40 mg Subcutaneous Q24H   ferrous sulfate  325 mg Oral Q breakfast   gabapentin  300 mg Oral BID   lisinopril  20 mg Oral Daily   pantoprazole  40 mg Oral BID   Continuous Infusions:  lactated ringers 100 mL/hr at 08/16/22 0615   piperacillin-tazobactam (ZOSYN)  IV 3.375 g (08/16/22 0944)     LOS: 2 days   Shelly Coss, MD [facility] P1/04/2023, 12:44 PM

## 2022-08-17 DIAGNOSIS — K859 Acute pancreatitis without necrosis or infection, unspecified: Secondary | ICD-10-CM | POA: Diagnosis not present

## 2022-08-17 LAB — GASTROINTESTINAL PANEL BY PCR, STOOL (REPLACES STOOL CULTURE)

## 2022-08-17 NOTE — Progress Notes (Signed)
PROGRESS NOTE  Thomas Guerrero  NTI:[id number] DOB: 1946-06-21 DOA: 08/13/2022 PCP: [name], MD   Brief Narrative: Patient is a 77 year old male with history of CKD stage IV, MGUS, hypertension, prostate cancer, hyperlipidemia who presented with complaint of upper respiratory symptoms for 2 weeks with nonproductive cough, runny nose, congestion, decreased oral intake.  Also complained of diarrhea, epigastric abdominal pain.  Imagings of  abdomen on presentation suspicious for pancreatitis, elevated lipase.  Admitted for the management of acute pancreatitis.  Further imaging showed duodenitis/pancreatitis.  Started on antibiotics.  Diet advanced to soft today.  Again he started having diarrhea, discharge plan canceled, getting GI pathogen panel  Assessment & Plan:  Principal Problem:   Acute pancreatitis Active Problems:   CKD (chronic kidney disease) stage 4, GFR 15-29 ml/min (HCC)   Metabolic acidosis, NAG, bicarbonate losses   Prostate cancer (HCC)   HTN (hypertension)  Acute pancreatitis/duodenitis: Presented with decreased oral intake, epigastric abdominal pain.MRCP showed circumferential wall thickening of the descending portion of the duodenum suspicious for  infectious or inflammatory duodenitis, including peptic ulcer disease, or alternately groove pancreatitis,minimal inflammatory fat stranding about the pancreatic head. Continue Zosyn for now because of finding of duodenitis though it could be viral. No fever or leukocytosis, will change antibiotics to oral on discharge. Abdomen pain  significantly improved .  No nausea or vomiting.  Diet advanced to soft.  Diarrhea: Has few loose stools yesterday, had a loose stool today.  Appears weak, patient feels thirsty.  Denies any abdomen pain today.  Will get GI pathogen panel.  Continue Zosyn for now low suspicion for C. Difficile  CKD stage 3b-IV: Also has history of MGUS.  Baseline creatinine around 2.  Currently kidney function  at baseline.  Follows with [facility].  Hypertension: Continue amlodipine, lisinopril  History of prostate cancer: Follows with urology,Dr Alinda Money.  Abnormal MRI: MRCP showed  Incidental note of a probable solid mass of the inferior pole of the right kidney measuring 2.4 x 2.1 cm concerning for renal cell carcinoma.Multiphasic contrast enhanced CT or MRI recommended to fully characterize when appropriate. No evidence of lymphadenopathy or metastatic disease in the abdomen on noncontrast examination. We strongly recommend patient to follow-up with urology as an outpatient and proceed with further workup.  Weakness: Lives with wife.  PT did not recommend any follow-up.      DVT prophylaxis:enoxaparin (LOVENOX) injection 40 mg Start: 08/14/22 1000     Code Status: Full Code  Family Communication: Called and discussed with wife on phone on 1/9  Patient status:Inpatient  Patient is from :Home  Anticipated discharge QQ:PYPP  Estimated DC date: Tomorrow   Consultants: None  Procedures:None  Antimicrobials:  Anti-infectives (From admission, onward)    Start     Dose/Rate Route Frequency Ordered Stop   08/16/22 0200  piperacillin-tazobactam (ZOSYN) IVPB 3.375 g        3.375 g 12.5 mL/hr over 240 Minutes Intravenous Every 8 hours 08/15/22 2240     08/15/22 1500  piperacillin-tazobactam (ZOSYN) IVPB 3.375 g  Status:  Discontinued        3.375 g 12.5 mL/hr over 240 Minutes Intravenous Every 8 hours 08/15/22 1447 08/15/22 2240       Subjective: Patient seen and examined at bedside today.  Hemodynamically stable.  Abdominal pain has resolved but he is bothered by diarrhea.  Denies any nausea or vomiting.  Objective: Vitals:   08/15/22 1920 08/16/22 1311 08/16/22 1938 08/17/22 0508  BP: (!) 136/59 117/62 124/62 (!) 128/50  Pulse: 64 (!) 56 77 (!) 59  Resp: $Remo'16 18 16 15  'ReTdr$ Temp: 97.8 F (36.6 C) 98.7 F (37.1 C) 97.8 F (36.6 C) 98.6 F (37 C)  TempSrc: Oral Oral Oral    SpO2: 98% 99% 100% 95%  Weight:      Height:        Intake/Output Summary (Last 24 hours) at 08/17/2022 1137 Last data filed at 08/16/2022 1500 Gross per 24 hour  Intake 236 ml  Output --  Net 236 ml   Filed Weights   08/14/22 0139  Weight: 87.3 kg    Examination:  General exam: Overall comfortable, not in distress HEENT: PERRL,dry mouth Respiratory system:  no wheezes or crackles  Cardiovascular system: S1 & S2 heard, RRR.  Gastrointestinal system: Abdomen is nondistended, soft and nontender. Central nervous system: Alert and oriented Extremities: No edema, no clubbing ,no cyanosis Skin: No rashes, no ulcers,no icterus     Data Reviewed: I have personally reviewed following labs and imaging studies  CBC: Recent Labs  Lab 08/13/22 2109 08/14/22 0557 08/15/22 0600 08/16/22 0550  WBC 11.7* 9.3 7.3 6.8  HGB 12.2* 10.3* 9.3* 9.2*  HCT 36.4* 30.8* 28.4* 27.8*  MCV 100.8* 102.3* 104.0* 103.7*  PLT 246 177 154 409*   Basic Metabolic Panel: Recent Labs  Lab 08/13/22 2109 08/14/22 0557 08/15/22 0600 08/16/22 0550  NA 133* 134* 133* 134*  K 4.7 4.3 4.1 4.2  CL 105 107 106 106  CO2 17* 19* 18* 19*  GLUCOSE 106* 99 88 76  BUN 48* 42* 38* 31*  CREATININE 2.06* 1.89* 1.77* 1.90*  CALCIUM 10.1 9.3 8.9 9.0  MG  --   --  2.0  --      Recent Results (from the past 240 hour(s))  Resp panel by RT-PCR (RSV, Flu A&B, Covid) Anterior Nasal Swab     Status: None   Collection Time: 08/13/22  8:37 PM   Specimen: Anterior Nasal Swab  Result Value Ref Range Status   SARS Coronavirus 2 by RT PCR NEGATIVE NEGATIVE Final    Comment: (NOTE) SARS-CoV-2 target nucleic acids are NOT DETECTED.  The SARS-CoV-2 RNA is generally detectable in upper respiratory specimens during the acute phase of infection. The lowest concentration of SARS-CoV-2 viral copies this assay can detect is 138 copies/mL. A negative result does not preclude SARS-Cov-2 infection and should not be used as  the sole basis for treatment or other patient management decisions. A negative result may occur with  improper specimen collection/handling, submission of specimen other than nasopharyngeal swab, presence of viral mutation(s) within the areas targeted by this assay, and inadequate number of viral copies(<138 copies/mL). A negative result must be combined with clinical observations, patient history, and epidemiological information. The expected result is Negative.  Fact Sheet for Patients:  EntrepreneurPulse.com.au  Fact Sheet for Healthcare Providers:  IncredibleEmployment.be  This test is no t yet approved or cleared by the [place] FDA and  has been authorized for detection and/or diagnosis of SARS-CoV-2 by FDA under an Emergency Use Authorization (EUA). This EUA will remain  in effect (meaning this test can be used) for the duration of the COVID-19 declaration under Section 564(b)(1) of the Act, 21 U.S.C.section 360bbb-3(b)(1), unless the authorization is terminated  or revoked sooner.       Influenza A by PCR NEGATIVE NEGATIVE Final   Influenza B by PCR NEGATIVE NEGATIVE Final    Comment: (NOTE) The Xpert Xpress SARS-CoV-2/FLU/RSV plus assay is intended as an  aid in the diagnosis of influenza from Nasopharyngeal swab specimens and should not be used as a sole basis for treatment. Nasal washings and aspirates are unacceptable for Xpert Xpress SARS-CoV-2/FLU/RSV testing.  Fact Sheet for Patients: EntrepreneurPulse.com.au  Fact Sheet for Healthcare Providers: IncredibleEmployment.be  This test is not yet approved or cleared by the [place] FDA and has been authorized for detection and/or diagnosis of SARS-CoV-2 by FDA under an Emergency Use Authorization (EUA). This EUA will remain in effect (meaning this test can be used) for the duration of the COVID-19 declaration under Section 564(b)(1) of  the Act, 21 U.S.C. section 360bbb-3(b)(1), unless the authorization is terminated or revoked.     Resp Syncytial Virus by PCR NEGATIVE NEGATIVE Final    Comment: (NOTE) Fact Sheet for Patients: EntrepreneurPulse.com.au  Fact Sheet for Healthcare Providers: IncredibleEmployment.be  This test is not yet approved or cleared by the [place] FDA and has been authorized for detection and/or diagnosis of SARS-CoV-2 by FDA under an Emergency Use Authorization (EUA). This EUA will remain in effect (meaning this test can be used) for the duration of the COVID-19 declaration under Section 564(b)(1) of the Act, 21 U.S.C. section 360bbb-3(b)(1), unless the authorization is terminated or revoked.  Performed at [facility], [place] [address], [place], [place] [address]   Respiratory (~20 pathogens) panel by PCR     Status: None   Collection Time: 08/13/22  8:37 PM   Specimen: Nasopharyngeal Swab; Respiratory  Result Value Ref Range Status   Adenovirus NOT DETECTED NOT DETECTED Final   Coronavirus 229E NOT DETECTED NOT DETECTED Final    Comment: (NOTE) The Coronavirus on the Respiratory Panel, DOES NOT test for the novel  Coronavirus (2019 nCoV)    Coronavirus HKU1 NOT DETECTED NOT DETECTED Final   Coronavirus NL63 NOT DETECTED NOT DETECTED Final   Coronavirus OC43 NOT DETECTED NOT DETECTED Final   Metapneumovirus NOT DETECTED NOT DETECTED Final   Rhinovirus / Enterovirus NOT DETECTED NOT DETECTED Final   Influenza A NOT DETECTED NOT DETECTED Final   Influenza B NOT DETECTED NOT DETECTED Final   Parainfluenza Virus 1 NOT DETECTED NOT DETECTED Final   Parainfluenza Virus 2 NOT DETECTED NOT DETECTED Final   Parainfluenza Virus 3 NOT DETECTED NOT DETECTED Final   Parainfluenza Virus 4 NOT DETECTED NOT DETECTED Final   Respiratory Syncytial Virus NOT DETECTED NOT DETECTED Final   Bordetella pertussis NOT DETECTED NOT DETECTED Final    Bordetella Parapertussis NOT DETECTED NOT DETECTED Final   Chlamydophila pneumoniae NOT DETECTED NOT DETECTED Final   Mycoplasma pneumoniae NOT DETECTED NOT DETECTED Final    Comment: Performed at [facility], [place]. [address], [place], [place] [address]     Radiology Studies: No results found.  Scheduled Meds:  allopurinol  100 mg Oral Daily   amLODipine  5 mg Oral Daily   enoxaparin (LOVENOX) injection  40 mg Subcutaneous Q24H   ferrous sulfate  325 mg Oral Q breakfast   gabapentin  300 mg Oral BID   lisinopril  20 mg Oral Daily   pantoprazole  40 mg Oral BID   Continuous Infusions:  lactated ringers 100 mL/hr at 08/17/22 0637   piperacillin-tazobactam (ZOSYN)  IV 3.375 g (08/17/22 0959)     LOS: 3 days   Thomas Coss, MD [facility] P1/05/2023, 11:37 AM  PROGRESS NOTE  Thomas Guerrero  MBT:[id number] DOB: February 03, 1946 DOA: 08/13/2022 PCP: [name], MD   Brief Narrative:   Assessment & Plan:  Principal Problem:   Acute pancreatitis Active Problems:   CKD (chronic kidney disease) stage 4, GFR 15-29 ml/min (HCC)   Metabolic acidosis, NAG, bicarbonate losses   Prostate cancer (HCC)   HTN (hypertension)           DVT prophylaxis:enoxaparin (LOVENOX) injection 40 mg Start: 08/14/22 1000     Code Status: Full Code  Family Communication:   Patient status:  Patient is from :  Anticipated discharge to:  Estimated DC date:   Consultants:   Procedures:  Antimicrobials:  Anti-infectives (From admission, onward)    Start     Dose/Rate Route Frequency Ordered Stop   08/16/22 0200  piperacillin-tazobactam (ZOSYN) IVPB 3.375 g        3.375 g 12.5 mL/hr over 240 Minutes Intravenous Every 8 hours 08/15/22 2240     08/15/22 1500  piperacillin-tazobactam (ZOSYN) IVPB 3.375 g  Status:  Discontinued        3.375 g 12.5 mL/hr over 240 Minutes Intravenous Every 8 hours 08/15/22 1447 08/15/22 2240        Subjective:   Objective: Vitals:   08/15/22 1920 08/16/22 1311 08/16/22 1938 08/17/22 0508  BP: (!) 136/59 117/62 124/62 (!) 128/50  Pulse: 64 (!) 56 77 (!) 59  Resp: $Remo'16 18 16 15  'UhpfR$ Temp: 97.8 F (36.6 C) 98.7 F (37.1 C) 97.8 F (36.6 C) 98.6 F (37 C)  TempSrc: Oral Oral Oral   SpO2: 98% 99% 100% 95%  Weight:      Height:        Intake/Output Summary (Last 24 hours) at 08/17/2022 1137 Last data filed at 08/16/2022 1500 Gross per 24 hour  Intake 236 ml  Output --  Net 236 ml   Filed Weights   08/14/22 0139  Weight: 87.3 kg    Examination:  General exam: Overall comfortable, not in distress HEENT: PERRL Respiratory system:  no wheezes or crackles  Cardiovascular system: S1 & S2 heard, RRR.  Gastrointestinal system: Abdomen is nondistended, soft and nontender. Central nervous system: Alert and oriented Extremities: No edema, no clubbing ,no cyanosis Skin: No rashes, no ulcers,no icterus     Data Reviewed: I have personally reviewed following labs and imaging studies  CBC: Recent Labs  Lab 08/13/22 2109 08/14/22 0557 08/15/22 0600 08/16/22 0550  WBC 11.7* 9.3 7.3 6.8  HGB 12.2* 10.3* 9.3* 9.2*  HCT 36.4* 30.8* 28.4* 27.8*  MCV 100.8* 102.3* 104.0* 103.7*  PLT 246 177 154 726*   Basic Metabolic Panel: Recent Labs  Lab 08/13/22 2109 08/14/22 0557 08/15/22 0600 08/16/22 0550  NA 133* 134* 133* 134*  K 4.7 4.3 4.1 4.2  CL 105 107 106 106  CO2 17* 19* 18* 19*  GLUCOSE 106* 99 88 76  BUN 48* 42* 38* 31*  CREATININE 2.06* 1.89* 1.77* 1.90*  CALCIUM 10.1 9.3 8.9 9.0  MG  --   --  2.0  --      Recent Results (from the past 240 hour(s))  Resp panel by RT-PCR (RSV, Flu A&B, Covid) Anterior Nasal Swab     Status: None   Collection Time: 08/13/22  8:37 PM   Specimen: Anterior Nasal Swab  Result Value Ref Range Status   SARS Coronavirus 2 by RT PCR NEGATIVE NEGATIVE Final    Comment: (NOTE) SARS-CoV-2 target nucleic acids are NOT DETECTED.  The  SARS-CoV-2 RNA is generally detectable in upper respiratory specimens during the acute phase of infection. The lowest concentration of SARS-CoV-2 viral copies this assay can  detect is 138 copies/mL. A negative result does not preclude SARS-Cov-2 infection and should not be used as the sole basis for treatment or other patient management decisions. A negative result may occur with  improper specimen collection/handling, submission of specimen other than nasopharyngeal swab, presence of viral mutation(s) within the areas targeted by this assay, and inadequate number of viral copies(<138 copies/mL). A negative result must be combined with clinical observations, patient history, and epidemiological information. The expected result is Negative.  Fact Sheet for Patients:  EntrepreneurPulse.com.au  Fact Sheet for Healthcare Providers:  IncredibleEmployment.be  This test is no t yet approved or cleared by the [place] FDA and  has been authorized for detection and/or diagnosis of SARS-CoV-2 by FDA under an Emergency Use Authorization (EUA). This EUA will remain  in effect (meaning this test can be used) for the duration of the COVID-19 declaration under Section 564(b)(1) of the Act, 21 U.S.C.section 360bbb-3(b)(1), unless the authorization is terminated  or revoked sooner.       Influenza A by PCR NEGATIVE NEGATIVE Final   Influenza B by PCR NEGATIVE NEGATIVE Final    Comment: (NOTE) The Xpert Xpress SARS-CoV-2/FLU/RSV plus assay is intended as an aid in the diagnosis of influenza from Nasopharyngeal swab specimens and should not be used as a sole basis for treatment. Nasal washings and aspirates are unacceptable for Xpert Xpress SARS-CoV-2/FLU/RSV testing.  Fact Sheet for Patients: EntrepreneurPulse.com.au  Fact Sheet for Healthcare Providers: IncredibleEmployment.be  This test is not yet approved or  cleared by the [place] FDA and has been authorized for detection and/or diagnosis of SARS-CoV-2 by FDA under an Emergency Use Authorization (EUA). This EUA will remain in effect (meaning this test can be used) for the duration of the COVID-19 declaration under Section 564(b)(1) of the Act, 21 U.S.C. section 360bbb-3(b)(1), unless the authorization is terminated or revoked.     Resp Syncytial Virus by PCR NEGATIVE NEGATIVE Final    Comment: (NOTE) Fact Sheet for Patients: EntrepreneurPulse.com.au  Fact Sheet for Healthcare Providers: IncredibleEmployment.be  This test is not yet approved or cleared by the [place] FDA and has been authorized for detection and/or diagnosis of SARS-CoV-2 by FDA under an Emergency Use Authorization (EUA). This EUA will remain in effect (meaning this test can be used) for the duration of the COVID-19 declaration under Section 564(b)(1) of the Act, 21 U.S.C. section 360bbb-3(b)(1), unless the authorization is terminated or revoked.  Performed at [facility], [place] [address], [place], [place] [address]   Respiratory (~20 pathogens) panel by PCR     Status: None   Collection Time: 08/13/22  8:37 PM   Specimen: Nasopharyngeal Swab; Respiratory  Result Value Ref Range Status   Adenovirus NOT DETECTED NOT DETECTED Final   Coronavirus 229E NOT DETECTED NOT DETECTED Final    Comment: (NOTE) The Coronavirus on the Respiratory Panel, DOES NOT test for the novel  Coronavirus (2019 nCoV)    Coronavirus HKU1 NOT DETECTED NOT DETECTED Final   Coronavirus NL63 NOT DETECTED NOT DETECTED Final   Coronavirus OC43 NOT DETECTED NOT DETECTED Final   Metapneumovirus NOT DETECTED NOT DETECTED Final   Rhinovirus / Enterovirus NOT DETECTED NOT DETECTED Final   Influenza A NOT DETECTED NOT DETECTED Final   Influenza B NOT DETECTED NOT DETECTED Final   Parainfluenza Virus 1 NOT DETECTED NOT DETECTED Final    Parainfluenza Virus 2 NOT DETECTED NOT DETECTED Final   Parainfluenza Virus 3 NOT DETECTED NOT DETECTED Final  Parainfluenza Virus 4 NOT DETECTED NOT DETECTED Final   Respiratory Syncytial Virus NOT DETECTED NOT DETECTED Final   Bordetella pertussis NOT DETECTED NOT DETECTED Final   Bordetella Parapertussis NOT DETECTED NOT DETECTED Final   Chlamydophila pneumoniae NOT DETECTED NOT DETECTED Final   Mycoplasma pneumoniae NOT DETECTED NOT DETECTED Final    Comment: Performed at [facility], [place] [address] [place], [place] [address]     Radiology Studies: No results found.  Scheduled Meds:  allopurinol  100 mg Oral Daily   amLODipine  5 mg Oral Daily   enoxaparin (LOVENOX) injection  40 mg Subcutaneous Q24H   ferrous sulfate  325 mg Oral Q breakfast   gabapentin  300 mg Oral BID   lisinopril  20 mg Oral Daily   pantoprazole  40 mg Oral BID   Continuous Infusions:  lactated ringers 100 mL/hr at 08/17/22 0637   piperacillin-tazobactam (ZOSYN)  IV 3.375 g (08/17/22 0959)     LOS: 3 days   Thomas Coss, MD [facility] P1/05/2023, 11:37 AM  PROGRESS NOTE  Thomas Guerrero  XBJ:[id number] DOB: Sep 15, 1945 DOA: 08/13/2022 PCP: [name], MD   Brief Narrative:   Assessment & Plan:  Principal Problem:   Acute pancreatitis Active Problems:   CKD (chronic kidney disease) stage 4, GFR 15-29 ml/min (HCC)   Metabolic acidosis, NAG, bicarbonate losses   Prostate cancer (HCC)   HTN (hypertension)           DVT prophylaxis:enoxaparin (LOVENOX) injection 40 mg Start: 08/14/22 1000     Code Status: Full Code  Family Communication:   Patient status:  Patient is from :  Anticipated discharge to:  Estimated DC date:   Consultants:   Procedures:  Antimicrobials:  Anti-infectives (From admission, onward)    Start     Dose/Rate Route Frequency Ordered Stop   08/16/22 0200  piperacillin-tazobactam (ZOSYN) IVPB 3.375 g        3.375 g 12.5  mL/hr over 240 Minutes Intravenous Every 8 hours 08/15/22 2240     08/15/22 1500  piperacillin-tazobactam (ZOSYN) IVPB 3.375 g  Status:  Discontinued        3.375 g 12.5 mL/hr over 240 Minutes Intravenous Every 8 hours 08/15/22 1447 08/15/22 2240       Subjective:   Objective: Vitals:   08/15/22 1920 08/16/22 1311 08/16/22 1938 08/17/22 0508  BP: (!) 136/59 117/62 124/62 (!) 128/50  Pulse: 64 (!) 56 77 (!) 59  Resp: $Remo'16 18 16 15  'xQasn$ Temp: 97.8 F (36.6 C) 98.7 F (37.1 C) 97.8 F (36.6 C) 98.6 F (37 C)  TempSrc: Oral Oral Oral   SpO2: 98% 99% 100% 95%  Weight:      Height:        Intake/Output Summary (Last 24 hours) at 08/17/2022 1137 Last data filed at 08/16/2022 1500 Gross per 24 hour  Intake 236 ml  Output --  Net 236 ml   Filed Weights   08/14/22 0139  Weight: 87.3 kg    Examination:  General exam: Overall comfortable, not in distress HEENT: PERRL Respiratory system:  no wheezes or crackles  Cardiovascular system: S1 & S2 heard, RRR.  Gastrointestinal system: Abdomen is nondistended, soft and nontender. Central nervous system: Alert and oriented Extremities: No edema, no clubbing ,no cyanosis Skin: No rashes, no ulcers,no icterus     Data Reviewed: I have personally reviewed following labs and imaging studies  CBC: Recent Labs  Lab 08/13/22 2109 08/14/22 0557 08/15/22 0600 08/16/22 0550  WBC 11.7* 9.3 7.3 6.8  HGB 12.2* 10.3* 9.3* 9.2*  HCT 36.4* 30.8* 28.4* 27.8*  MCV 100.8* 102.3* 104.0* 103.7*  PLT 246 177 154 093*   Basic Metabolic Panel: Recent Labs  Lab 08/13/22 2109 08/14/22 0557 08/15/22 0600 08/16/22 0550  NA 133* 134* 133* 134*  K 4.7 4.3 4.1 4.2  CL 105 107 106 106  CO2 17* 19* 18* 19*  GLUCOSE 106* 99 88 76  BUN 48* 42* 38* 31*  CREATININE 2.06* 1.89* 1.77* 1.90*  CALCIUM 10.1 9.3 8.9 9.0  MG  --   --  2.0  --      Recent Results (from the past 240 hour(s))  Resp panel by RT-PCR (RSV, Flu A&B, Covid) Anterior Nasal Swab      Status: None   Collection Time: 08/13/22  8:37 PM   Specimen: Anterior Nasal Swab  Result Value Ref Range Status   SARS Coronavirus 2 by RT PCR NEGATIVE NEGATIVE Final    Comment: (NOTE) SARS-CoV-2 target nucleic acids are NOT DETECTED.  The SARS-CoV-2 RNA is generally detectable in upper respiratory specimens during the acute phase of infection. The lowest concentration of SARS-CoV-2 viral copies this assay can detect is 138 copies/mL. A negative result does not preclude SARS-Cov-2 infection and should not be used as the sole basis for treatment or other patient management decisions. A negative result may occur with  improper specimen collection/handling, submission of specimen other than nasopharyngeal swab, presence of viral mutation(s) within the areas targeted by this assay, and inadequate number of viral copies(<138 copies/mL). A negative result must be combined with clinical observations, patient history, and epidemiological information. The expected result is Negative.  Fact Sheet for Patients:  EntrepreneurPulse.com.au  Fact Sheet for Healthcare Providers:  IncredibleEmployment.be  This test is no t yet approved or cleared by the [place] FDA and  has been authorized for detection and/or diagnosis of SARS-CoV-2 by FDA under an Emergency Use Authorization (EUA). This EUA will remain  in effect (meaning this test can be used) for the duration of the COVID-19 declaration under Section 564(b)(1) of the Act, 21 U.S.C.section 360bbb-3(b)(1), unless the authorization is terminated  or revoked sooner.       Influenza A by PCR NEGATIVE NEGATIVE Final   Influenza B by PCR NEGATIVE NEGATIVE Final    Comment: (NOTE) The Xpert Xpress SARS-CoV-2/FLU/RSV plus assay is intended as an aid in the diagnosis of influenza from Nasopharyngeal swab specimens and should not be used as a sole basis for treatment. Nasal washings and aspirates are  unacceptable for Xpert Xpress SARS-CoV-2/FLU/RSV testing.  Fact Sheet for Patients: EntrepreneurPulse.com.au  Fact Sheet for Healthcare Providers: IncredibleEmployment.be  This test is not yet approved or cleared by the [place] FDA and has been authorized for detection and/or diagnosis of SARS-CoV-2 by FDA under an Emergency Use Authorization (EUA). This EUA will remain in effect (meaning this test can be used) for the duration of the COVID-19 declaration under Section 564(b)(1) of the Act, 21 U.S.C. section 360bbb-3(b)(1), unless the authorization is terminated or revoked.     Resp Syncytial Virus by PCR NEGATIVE NEGATIVE Final    Comment: (NOTE) Fact Sheet for Patients: EntrepreneurPulse.com.au  Fact Sheet for Healthcare Providers: IncredibleEmployment.be  This test is not yet approved or cleared by the [place] FDA and has been authorized for detection and/or diagnosis of SARS-CoV-2 by FDA under an Emergency Use Authorization (EUA). This EUA will remain in effect (meaning this test can be used) for  the duration of the COVID-19 declaration under Section 564(b)(1) of the Act, 21 U.S.C. section 360bbb-3(b)(1), unless the authorization is terminated or revoked.  Performed at [facility], [place] [address] [place], [place] [address]   Respiratory (~20 pathogens) panel by PCR     Status: None   Collection Time: 08/13/22  8:37 PM   Specimen: Nasopharyngeal Swab; Respiratory  Result Value Ref Range Status   Adenovirus NOT DETECTED NOT DETECTED Final   Coronavirus 229E NOT DETECTED NOT DETECTED Final    Comment: (NOTE) The Coronavirus on the Respiratory Panel, DOES NOT test for the novel  Coronavirus (2019 nCoV)    Coronavirus HKU1 NOT DETECTED NOT DETECTED Final   Coronavirus NL63 NOT DETECTED NOT DETECTED Final   Coronavirus OC43 NOT DETECTED NOT DETECTED Final    Metapneumovirus NOT DETECTED NOT DETECTED Final   Rhinovirus / Enterovirus NOT DETECTED NOT DETECTED Final   Influenza A NOT DETECTED NOT DETECTED Final   Influenza B NOT DETECTED NOT DETECTED Final   Parainfluenza Virus 1 NOT DETECTED NOT DETECTED Final   Parainfluenza Virus 2 NOT DETECTED NOT DETECTED Final   Parainfluenza Virus 3 NOT DETECTED NOT DETECTED Final   Parainfluenza Virus 4 NOT DETECTED NOT DETECTED Final   Respiratory Syncytial Virus NOT DETECTED NOT DETECTED Final   Bordetella pertussis NOT DETECTED NOT DETECTED Final   Bordetella Parapertussis NOT DETECTED NOT DETECTED Final   Chlamydophila pneumoniae NOT DETECTED NOT DETECTED Final   Mycoplasma pneumoniae NOT DETECTED NOT DETECTED Final    Comment: Performed at [facility], [place]. [address] [place], [place] [address]     Radiology Studies: No results found.  Scheduled Meds:  allopurinol  100 mg Oral Daily   amLODipine  5 mg Oral Daily   enoxaparin (LOVENOX) injection  40 mg Subcutaneous Q24H   ferrous sulfate  325 mg Oral Q breakfast   gabapentin  300 mg Oral BID   lisinopril  20 mg Oral Daily   pantoprazole  40 mg Oral BID   Continuous Infusions:  lactated ringers 100 mL/hr at 08/17/22 0637   piperacillin-tazobactam (ZOSYN)  IV 3.375 g (08/17/22 0959)     LOS: 3 days   Thomas Coss, MD [facility] P1/05/2023, 11:37 AM

## 2022-08-17 NOTE — Progress Notes (Signed)
Mobility Specialist - Progress Note   08/17/22 1030  Mobility  Activity Ambulated with assistance to bathroom  Level of Assistance Standby assist, set-up cues, supervision of patient - no hands on  Assistive Device Other (Comment) (iv pole)  Distance Ambulated (ft) 15 ft  Activity Response Tolerated well  Mobility Referral Yes  $Mobility charge 1 Mobility   Pt refused mobility but requested assistance to rest room. Had no issues during ambulation, pt did request ambulation later this afternoon. Will check back in if schedule permits.    Roderick Pee Mobility Specialist

## 2022-08-17 NOTE — Care Management Important Message (Signed)
Important Message  Patient Details  Name: Thomas Guerrero MRN: [id number] Date of Birth: 02-15-1946   Medicare Important Message Given:  Yes     Memory Argue 08/17/2022, 1:03 PM

## 2022-08-18 DIAGNOSIS — K859 Acute pancreatitis without necrosis or infection, unspecified: Secondary | ICD-10-CM | POA: Diagnosis not present

## 2022-08-18 LAB — CBC
HCT: 26 % — ABNORMAL LOW (ref 39.0–52.0)
Hemoglobin: 8.5 g/dL — ABNORMAL LOW (ref 13.0–17.0)
MCH: 34.4 pg — ABNORMAL HIGH (ref 26.0–34.0)
MCHC: 32.7 g/dL (ref 30.0–36.0)
MCV: 105.3 fL — ABNORMAL HIGH (ref 80.0–100.0)
Platelets: 125 10*3/uL — ABNORMAL LOW (ref 150–400)
RBC: 2.47 MIL/uL — ABNORMAL LOW (ref 4.22–5.81)
RDW: 12.8 % (ref 11.5–15.5)
WBC: 5.1 10*3/uL (ref 4.0–10.5)
nRBC: 0 % (ref 0.0–0.2)

## 2022-08-18 LAB — BASIC METABOLIC PANEL
Anion gap: 6 (ref 5–15)
BUN: 29 mg/dL — ABNORMAL HIGH (ref 8–23)
CO2: 20 mmol/L — ABNORMAL LOW (ref 22–32)
Calcium: 8.5 mg/dL — ABNORMAL LOW (ref 8.9–10.3)
Chloride: 113 mmol/L — ABNORMAL HIGH (ref 98–111)
Creatinine, Ser: 2.23 mg/dL — ABNORMAL HIGH (ref 0.61–1.24)
GFR, Estimated: 30 mL/min — ABNORMAL LOW (ref >60)
Glucose, Bld: 103 mg/dL — ABNORMAL HIGH (ref 70–99)
Potassium: 3.8 mmol/L (ref 3.5–5.1)
Sodium: 139 mmol/L (ref 135–145)

## 2022-08-18 MED ORDER — LOPERAMIDE HCL 2 MG PO TABS: 2.0000 mg | ORAL_TABLET | Freq: Three times a day (TID) | ORAL | 0 refills | Status: DC | PRN

## 2022-08-18 MED ORDER — SODIUM BICARBONATE 650 MG PO TABS: 650.0000 mg | ORAL_TABLET | Freq: Two times a day (BID) | ORAL | 0 refills | Status: AC

## 2022-08-18 MED ORDER — PANTOPRAZOLE SODIUM 40 MG PO TBEC: 40.0000 mg | DELAYED_RELEASE_TABLET | Freq: Two times a day (BID) | ORAL | 0 refills | Status: DC

## 2022-08-18 MED ORDER — SODIUM BICARBONATE 650 MG PO TABS: 650.0000 mg | ORAL_TABLET | Freq: Two times a day (BID) | ORAL | Status: DC

## 2022-08-18 NOTE — Plan of Care (Signed)

## 2022-08-18 NOTE — Discharge Summary (Signed)
Physician Discharge Summary  Thomas Guerrero TJW:[id number] DOB: 09-30-1945 DOA: 08/13/2022  PCP: Maurice Small, MD  Admit date: 08/13/2022 Discharge date: 08/18/2022  Admitted From: Home Disposition:  Home  Discharge Condition:Stable CODE STATUS:FULL Diet recommendation: Heart Healthy  Brief/Interim Summary: Patient is a 77 year old male with history of CKD stage IV, MGUS, hypertension, prostate cancer, hyperlipidemia who presented with complaint of upper respiratory symptoms for 2 weeks with nonproductive cough, runny nose, congestion, decreased oral intake.  Also complained of diarrhea, epigastric abdominal pain.  Imagings of  abdomen on presentation suspicious for pancreatitis, elevated lipase.  Admitted for the management of acute pancreatitis.  Further imaging showed duodenitis/pancreatitis.  Started on antibiotics.  Diet advanced to soft and he is tolerating.  His diarrhea has improved, he does not have any abdominal pain today.  He is medically stable for discharge home.    Following problems were addressed during the hospitalization:  Acute pancreatitis/duodenitis: Presented with decreased oral intake, epigastric abdominal pain.MRCP showed circumferential wall thickening of the descending portion of the duodenum suspicious for  infectious or inflammatory duodenitis, including peptic ulcer disease, or alternately groove pancreatitis,minimal inflammatory fat stranding about the pancreatic head. Started on  Zosyn  because of finding of duodenitis though it could be viral. No fever or leukocytosis.Abdomen pain  significantly improved .  No nausea or vomiting.  Diet advanced to soft.He is tolerating. His abdomen soft and nontender. Continue Protonix twice daily for a month   Diarrhea: GI pathogen panel negative.  Low suspicion for C diff.  Antibiotics stopped.  No fever or leukocytosis.  CKD stage 3b-IV: Also has history of MGUS.  Baseline creatinine around 2.  Currently kidney function  at baseline.  Follows with [facility].  Started on bicarb tablet for non-anion gap metabolic acidosis.   Hypertension: Continue amlodipine, lisinopril   History of prostate cancer: Follows with urology,Dr Laverle Patter.   Abnormal MRI: MRCP showed  Incidental note of a probable solid mass of the inferior pole of the right kidney measuring 2.4 x 2.1 cm concerning for renal cell carcinoma.Multiphasic contrast enhanced CT or MRI recommended to fully characterize when appropriate. No evidence of lymphadenopathy or metastatic disease in the abdomen on noncontrast examination. We strongly recommend patient to follow-up with urology as an outpatient and proceed with further workup.   Weakness: Lives with wife.  PT did not recommend any follow-up.   Discharge Diagnoses:  Principal Problem:   Acute pancreatitis Active Problems:   CKD (chronic kidney disease) stage 4, GFR 15-29 ml/min (HCC)   Metabolic acidosis, NAG, bicarbonate losses   Prostate cancer (HCC)   HTN (hypertension)    Discharge Instructions  Discharge Instructions     Diet - low sodium heart healthy   Complete by: As directed    Discharge instructions   Complete by: As directed    1)Please take prescribed medications as instructed 2)Follow up with your PCP in a week.  Do a BMP test to check your kidney function 3)Follow up with the nephrologist 4)We have found that you might have a masslike lesion on the lower pole of the right kidney.  Please follow-up with your urologist, Dr. Laverle Patter soon as possible for further investigation   Increase activity slowly   Complete by: As directed       Allergies as of 08/18/2022   No Known Allergies      Medication List     STOP taking these medications    azithromycin 250 MG tablet Commonly known as: YPZXAQWBE  TAKE these medications    allopurinol 100 MG tablet Commonly known as: ZYLOPRIM Take 100 mg by mouth daily.   amLODipine 2.5 MG tablet Commonly known as:  NORVASC Take 5 mg by mouth daily.   atorvastatin 10 MG tablet Commonly known as: LIPITOR Take 10 mg by mouth daily.   cholecalciferol 1000 units tablet Commonly known as: VITAMIN D Take 1,000 Units by mouth daily.   famotidine 10 MG tablet Commonly known as: PEPCID Take 10 mg by mouth daily as needed for heartburn or indigestion. Reported on 09/18/2015   ferrous sulfate 325 (65 FE) MG tablet Take 325 mg by mouth daily with breakfast.   gabapentin 300 MG capsule Commonly known as: NEURONTIN Take 1 capsule (300 mg total) by mouth 3 (three) times daily. What changed: when to take this   lisinopril 20 MG tablet Commonly known as: ZESTRIL Take 20 mg by mouth daily.   loperamide 2 MG tablet Commonly known as: Imodium A-D Take 1 tablet (2 mg total) by mouth 3 (three) times daily as needed for diarrhea or loose stools.   ondansetron 4 MG tablet Commonly known as: ZOFRAN Take 4 mg by mouth every 8 (eight) hours as needed for nausea or vomiting.   sodium bicarbonate 650 MG tablet Take 1 tablet (650 mg total) by mouth 2 (two) times daily.   triamcinolone cream 0.1 % Commonly known as: KENALOG Apply 1 application  topically 2 (two) times daily as needed (rash).   VITAMIN B-12 PO        No Known Allergies  Consultations: None   Procedures/Studies: MR ABDOMEN MRCP WO CONTRAST  Result Date: 08/14/2022 CLINICAL DATA:  Pancreatitis suspected, abdominal pain, nausea and vomiting EXAM: MRI ABDOMEN WITHOUT CONTRAST  (INCLUDING MRCP) TECHNIQUE: Multiplanar multisequence MR imaging of the abdomen was performed. Heavily T2-weighted images of the biliary and pancreatic ducts were obtained, and three-dimensional MRCP images were rendered by post processing. COMPARISON:  CT abdomen pelvis, 08/13/2022, MR abdomen, 08/27/2018 FINDINGS: Lower chest: No acute abnormality. Hepatobiliary: No solid liver abnormality is seen. Probable sludge ball in the gallbladder (series 4, image 19). No  gallstones, gallbladder wall thickening, or biliary dilatation. Pancreas: Minimal inflammatory fat stranding about the pancreatic head adjacent to the descending duodenum. No pancreatic ductal dilatation or other inflammatory findings about the pancreas. Spleen: Normal in size without significant abnormality. Adrenals/Urinary Tract: Adrenal glands are unremarkable. Incidental note of a probable solid mass of the inferior pole of the right kidney measuring 2.4 x 2.1 cm (series 3, image 15). Multiple additional simple appearing bilateral renal cortical and parapelvic cysts, benign. Stomach/Bowel: Stomach is within normal limits. Circumferential wall thickening of the descending portion of the duodenum (series 5, image 23). Small amount of adjacent inflammatory fat stranding. Vascular/Lymphatic: No significant vascular findings are present. No enlarged abdominal lymph nodes. Other: No abdominal wall hernia or abnormality. No ascites. Musculoskeletal: No acute or significant osseous findings. IMPRESSION: 1. Circumferential wall thickening of the descending portion of the duodenum, with small amount of adjacent inflammatory fat stranding. This may reflect infectious or inflammatory duodenitis, including peptic ulcer disease, or alternately groove pancreatitis. 2. Minimal inflammatory fat stranding about the pancreatic head adjacent to the descending duodenum, as above. No pancreatic ductal dilatation or other inflammatory findings about the remainder of the pancreas. 3. Probable sludge ball in the gallbladder. No gallstones, gallbladder wall thickening, or biliary dilatation. 4. Incidental note of a probable solid mass of the inferior pole of the right kidney measuring 2.4 x 2.1 cm  concerning for renal cell carcinoma. Recommend multiphasic contrast enhanced CT or MRI to fully characterize. No evidence of lymphadenopathy or metastatic disease in the abdomen on noncontrast examination. Electronically Signed   By: Delanna Ahmadi M.D.   On: 08/14/2022 16:48   CT ABDOMEN PELVIS WO CONTRAST  Result Date: 08/13/2022 CLINICAL DATA:  Acute abdominal pain.  Nausea and vomiting. EXAM: CT ABDOMEN AND PELVIS WITHOUT CONTRAST TECHNIQUE: Multidetector CT imaging of the abdomen and pelvis was performed following the standard protocol without IV contrast. RADIATION DOSE REDUCTION: This exam was performed according to the departmental dose-optimization program which includes automated exposure control, adjustment of the mA and/or kV according to patient size and/or use of iterative reconstruction technique. COMPARISON:  CT pelvis 09/22/2015 and MR abdomen 08/27/2018 FINDINGS: Lower chest: No acute abnormality. Hepatobiliary: No focal liver abnormality is seen. No gallstones, gallbladder wall thickening, or biliary dilatation. Pancreas: Mild stranding about the descending duodenum/pancreatic head. No ductal dilation. Spleen: Normal in size without focal abnormality. Adrenals/Urinary Tract: Normal adrenal glands. Bilateral cortical renal scarring. No urinary calculi or hydronephrosis. Unremarkable bladder. Stomach/Bowel: Normal caliber large and small bowel. Colonic diverticulosis without diverticulitis. Normal appendix. Mild wall thickening and adjacent stranding about the descending duodenum (circa series 2/image 31). Vascular/Lymphatic: Aortic atherosclerosis. 1.0 cm precaval node (2/29). Reproductive: Metallic densities in the prostate. Other: Fat containing bilateral inguinal hernias. No free intraperitoneal air. Musculoskeletal: No acute osseous abnormality. IMPRESSION: Mild stranding about the pancreatic head and descending duodenum. 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 Recommend correlation with lipase. No ductal dilation. Follow-up is recommended to ensure resolution and exclude underlying mass. 1.0 center meter precaval node is favored reactive. Continued attention on follow-up. Aortic Atherosclerosis  (ICD10-I70.0). Electronically Signed   By: Placido Sou M.D.   On: 08/13/2022 23:21   CT Head Wo Contrast  Result Date: 08/13/2022 CLINICAL DATA:  Headache, new onset. Malaise, decreased appetite, nausea and vomiting. EXAM: CT HEAD WITHOUT CONTRAST TECHNIQUE: Contiguous axial images were obtained from the base of the skull through the vertex without intravenous contrast. RADIATION DOSE REDUCTION: This exam was performed according to the departmental dose-optimization program which includes automated exposure control, adjustment of the mA and/or kV according to patient size and/or use of iterative reconstruction technique. COMPARISON:  12/25/2020. FINDINGS: Brain: No acute intracranial hemorrhage, midline shift or mass effect. No extra-axial fluid collection. Gray-white matter differentiation is within normal limits. No hydrocephalus. Vascular: No hyperdense vessel or unexpected calcification. Skull: Normal. Negative for fracture or focal lesion. Sinuses/Orbits: Mild debris in the maxillary sinuses and ethmoid air cells. No acute orbital abnormality. Other: None. IMPRESSION: No acute intracranial process. Electronically Signed   By: Brett Fairy M.D.   On: 08/13/2022 23:20   DG Chest Port 1 View  Result Date: 08/13/2022 CLINICAL DATA:  Decreased appetite with nausea and vomiting. EXAM: PORTABLE CHEST 1 VIEW COMPARISON:  August 09, 2022 FINDINGS: The heart size and mediastinal contours are within normal limits. Both lungs are clear. The visualized skeletal structures are unremarkable. IMPRESSION: No active disease. Electronically Signed   By: Virgina Norfolk M.D.   On: 08/13/2022 21:00   DG Chest 2 View  Result Date: 08/09/2022 CLINICAL DATA:  Cord breast sounds on the left, fatigue EXAM: CHEST - 2 VIEW COMPARISON:  None Available. FINDINGS: Cardiac and mediastinal contours are within normal limits. Small area of heterogeneous opacity in the left lower lung. No focal pulmonary opacity in the right lung.  No pleural effusion or pneumothorax. No acute  osseous abnormality. IMPRESSION: Small area of heterogeneous opacity in the left lower lung, which may represent atelectasis or infection. Recommend follow-up chest radiograph in 4-6 weeks to ensure resolution. Electronically Signed   By: Merilyn Baba M.D.   On: 08/09/2022 11:34      Subjective: Patient seen and examined at bedside today.  Hemodynamically stable for discharge.  Discharge Exam: Vitals:   08/17/22 2115 08/18/22 0509  BP: 119/62 (!) 142/64  Pulse: (!) 58 (!) 58  Resp: 18 18  Temp: 97.7 F (36.5 C) 98.1 F (36.7 C)  SpO2: 100% 100%   Vitals:   08/17/22 0508 08/17/22 1332 08/17/22 2115 08/18/22 0509  BP: (!) 128/50 (!) 122/57 119/62 (!) 142/64  Pulse: (!) 59 (!) 59 (!) 58 (!) 58  Resp: $Remo'15 18 18 18  'SkOgh$ Temp: 98.6 F (37 C) 98.3 F (36.8 C) 97.7 F (36.5 C) 98.1 F (36.7 C)  TempSrc:  Oral Oral Oral  SpO2: 95% 97% 100% 100%  Weight:      Height:        General: Pt is alert, awake, not in acute distress Cardiovascular: RRR, S1/S2 +, no rubs, no gallops Respiratory: CTA bilaterally, no wheezing, no rhonchi Abdominal: Soft, NT, ND, bowel sounds + Extremities: no edema, no cyanosis    The results of significant diagnostics from this hospitalization (including imaging, microbiology, ancillary and laboratory) are listed below for reference.     Microbiology: Recent Results (from the past 240 hour(s))  Resp panel by RT-PCR (RSV, Flu A&B, Covid) Anterior Nasal Swab     Status: None   Collection Time: 08/13/22  8:37 PM   Specimen: Anterior Nasal Swab  Result Value Ref Range Status   SARS Coronavirus 2 by RT PCR NEGATIVE NEGATIVE Final    Comment: (NOTE) SARS-CoV-2 target nucleic acids are NOT DETECTED.  The SARS-CoV-2 RNA is generally detectable in upper respiratory specimens during the acute phase of infection. The lowest concentration of SARS-CoV-2 viral copies this assay can detect is 138 copies/mL. A negative  result does not preclude SARS-Cov-2 infection and should not be used as the sole basis for treatment or other patient management decisions. A negative result may occur with  improper specimen collection/handling, submission of specimen other than nasopharyngeal swab, presence of viral mutation(s) within the areas targeted by this assay, and inadequate number of viral copies(<138 copies/mL). A negative result must be combined with clinical observations, patient history, and epidemiological information. The expected result is Negative.  Fact Sheet for Patients:  EntrepreneurPulse.com.au  Fact Sheet for Healthcare Providers:  IncredibleEmployment.be  This test is no t yet approved or cleared by the [place] FDA and  has been authorized for detection and/or diagnosis of SARS-CoV-2 by FDA under an Emergency Use Authorization (EUA). This EUA will remain  in effect (meaning this test can be used) for the duration of the COVID-19 declaration under Section 564(b)(1) of the Act, 21 U.S.C.section 360bbb-3(b)(1), unless the authorization is terminated  or revoked sooner.       Influenza A by PCR NEGATIVE NEGATIVE Final   Influenza B by PCR NEGATIVE NEGATIVE Final    Comment: (NOTE) The Xpert Xpress SARS-CoV-2/FLU/RSV plus assay is intended as an aid in the diagnosis of influenza from Nasopharyngeal swab specimens and should not be used as a sole basis for treatment. Nasal washings and aspirates are unacceptable for Xpert Xpress SARS-CoV-2/FLU/RSV testing.  Fact Sheet for Patients: EntrepreneurPulse.com.au  Fact Sheet for Healthcare Providers: IncredibleEmployment.be  This test is not yet approved  or cleared by the [place] and has been authorized for detection and/or diagnosis of SARS-CoV-2 by FDA under an Emergency Use Authorization (EUA). This EUA will remain in effect (meaning this test can be used)  for the duration of the COVID-19 declaration under Section 564(b)(1) of the Act, 21 U.S.C. section 360bbb-3(b)(1), unless the authorization is terminated or revoked.     Resp Syncytial Virus by PCR NEGATIVE NEGATIVE Final    Comment: (NOTE) Fact Sheet for Patients: EntrepreneurPulse.com.au  Fact Sheet for Healthcare Providers: IncredibleEmployment.be  This test is not yet approved or cleared by the [place] FDA and has been authorized for detection and/or diagnosis of SARS-CoV-2 by FDA under an Emergency Use Authorization (EUA). This EUA will remain in effect (meaning this test can be used) for the duration of the COVID-19 declaration under Section 564(b)(1) of the Act, 21 U.S.C. section 360bbb-3(b)(1), unless the authorization is terminated or revoked.  Performed at [facility], [place] [address], [place], [place] [address]   Respiratory (~20 pathogens) panel by PCR     Status: None   Collection Time: 08/13/22  8:37 PM   Specimen: Nasopharyngeal Swab; Respiratory  Result Value Ref Range Status   Adenovirus NOT DETECTED NOT DETECTED Final   Coronavirus 229E NOT DETECTED NOT DETECTED Final    Comment: (NOTE) The Coronavirus on the Respiratory Panel, DOES NOT test for the novel  Coronavirus (2019 nCoV)    Coronavirus HKU1 NOT DETECTED NOT DETECTED Final   Coronavirus NL63 NOT DETECTED NOT DETECTED Final   Coronavirus OC43 NOT DETECTED NOT DETECTED Final   Metapneumovirus NOT DETECTED NOT DETECTED Final   Rhinovirus / Enterovirus NOT DETECTED NOT DETECTED Final   Influenza A NOT DETECTED NOT DETECTED Final   Influenza B NOT DETECTED NOT DETECTED Final   Parainfluenza Virus 1 NOT DETECTED NOT DETECTED Final   Parainfluenza Virus 2 NOT DETECTED NOT DETECTED Final   Parainfluenza Virus 3 NOT DETECTED NOT DETECTED Final   Parainfluenza Virus 4 NOT DETECTED NOT DETECTED Final   Respiratory Syncytial Virus NOT DETECTED NOT  DETECTED Final   Bordetella pertussis NOT DETECTED NOT DETECTED Final   Bordetella Parapertussis NOT DETECTED NOT DETECTED Final   Chlamydophila pneumoniae NOT DETECTED NOT DETECTED Final   Mycoplasma pneumoniae NOT DETECTED NOT DETECTED Final    Comment: Performed at [facility], [place]. [address], [place],  [address]  Gastrointestinal Panel by PCR , Stool     Status: None   Collection Time: 08/17/22 10:50 AM   Specimen: Stool  Result Value Ref Range Status   Campylobacter species NOT DETECTED NOT DETECTED Final   Plesimonas shigelloides NOT DETECTED NOT DETECTED Final   Salmonella species NOT DETECTED NOT DETECTED Final   Yersinia enterocolitica NOT DETECTED NOT DETECTED Final   Vibrio species NOT DETECTED NOT DETECTED Final   Vibrio cholerae NOT DETECTED NOT DETECTED Final   Enteroaggregative E coli (EAEC) NOT DETECTED NOT DETECTED Final   Enteropathogenic E coli (EPEC) NOT DETECTED NOT DETECTED Final   Enterotoxigenic E coli (ETEC) NOT DETECTED NOT DETECTED Final   Shiga like toxin producing E coli (STEC) NOT DETECTED NOT DETECTED Final   Shigella/Enteroinvasive E coli (EIEC) NOT DETECTED NOT DETECTED Final   Cryptosporidium NOT DETECTED NOT DETECTED Final   Cyclospora cayetanensis NOT DETECTED NOT DETECTED Final   Entamoeba histolytica NOT DETECTED NOT DETECTED Final   Giardia lamblia NOT DETECTED NOT DETECTED Final   Adenovirus F40/41 NOT DETECTED NOT DETECTED Final  Astrovirus NOT DETECTED NOT DETECTED Final   Norovirus GI/GII NOT DETECTED NOT DETECTED Final   Rotavirus A NOT DETECTED NOT DETECTED Final   Sapovirus (I, II, IV, and V) NOT DETECTED NOT DETECTED Final    Comment: Performed at [facility], [address] [place], [place] [address]     Labs: BNP (last 3 results) No results for input(s): "BNP" in the last 8760 hours. Basic Metabolic Panel: Recent Labs  Lab 08/13/22 2109 08/14/22 0557 08/15/22 0600 08/16/22 0550 08/18/22 0626  NA  133* 134* 133* 134* 139  K 4.7 4.3 4.1 4.2 3.8  CL 105 107 106 106 113*  CO2 17* 19* 18* 19* 20*  GLUCOSE 106* 99 88 76 103*  BUN 48* 42* 38* 31* 29*  CREATININE 2.06* 1.89* 1.77* 1.90* 2.23*  CALCIUM 10.1 9.3 8.9 9.0 8.5*  MG  --   --  2.0  --   --    Liver Function Tests: Recent Labs  Lab 08/13/22 2109 08/14/22 0557 08/15/22 0600 08/16/22 0550  AST 20 13* 13* 14*  ALT $Re'13 11 10 10  'QaW$ ALKPHOS 64 52 48 46  BILITOT 1.3* 0.8 0.9 1.2  PROT 8.1 6.1* 5.6* 5.1*  ALBUMIN 4.2 3.0* 2.9* 2.6*   Recent Labs  Lab 08/13/22 2109 08/15/22 0600  LIPASE 98* 82*   No results for input(s): "AMMONIA" in the last 168 hours. CBC: Recent Labs  Lab 08/13/22 2109 08/14/22 0557 08/15/22 0600 08/16/22 0550 08/18/22 0626  WBC 11.7* 9.3 7.3 6.8 5.1  HGB 12.2* 10.3* 9.3* 9.2* 8.5*  HCT 36.4* 30.8* 28.4* 27.8* 26.0*  MCV 100.8* 102.3* 104.0* 103.7* 105.3*  PLT 246 177 154 141* 125*   Cardiac Enzymes: No results for input(s): "CKTOTAL", "CKMB", "CKMBINDEX", "TROPONINI" in the last 168 hours. BNP: Invalid input(s): "POCBNP" CBG: No results for input(s): "GLUCAP" in the last 168 hours. D-Dimer No results for input(s): "DDIMER" in the last 72 hours. Hgb A1c No results for input(s): "HGBA1C" in the last 72 hours. Lipid Profile No results for input(s): "CHOL", "HDL", "LDLCALC", "TRIG", "CHOLHDL", "LDLDIRECT" in the last 72 hours. Thyroid function studies No results for input(s): "TSH", "T4TOTAL", "T3FREE", "THYROIDAB" in the last 72 hours.  Invalid input(s): "FREET3" Anemia work up No results for input(s): "VITAMINB12", "FOLATE", "FERRITIN", "TIBC", "IRON", "RETICCTPCT" in the last 72 hours. Urinalysis    Component Value Date/Time   COLORURINE STRAW (A) 08/16/2022 0933   APPEARANCEUR CLEAR 08/16/2022 0933   LABSPEC 1.012 08/16/2022 0933   PHURINE 5.0 08/16/2022 0933   GLUCOSEU NEGATIVE 08/16/2022 0933   HGBUR NEGATIVE 08/16/2022 0933   BILIRUBINUR NEGATIVE 08/16/2022 0933   KETONESUR  5 (A) 08/16/2022 0933   PROTEINUR NEGATIVE 08/16/2022 0933   NITRITE NEGATIVE 08/16/2022 0933   LEUKOCYTESUR NEGATIVE 08/16/2022 0933   Sepsis Labs Recent Labs  Lab 08/14/22 0557 08/15/22 0600 08/16/22 0550 08/18/22 0626  WBC 9.3 7.3 6.8 5.1   Microbiology Recent Results (from the past 240 hour(s))  Resp panel by RT-PCR (RSV, Flu A&B, Covid) Anterior Nasal Swab     Status: None   Collection Time: 08/13/22  8:37 PM   Specimen: Anterior Nasal Swab  Result Value Ref Range Status   SARS Coronavirus 2 by RT PCR NEGATIVE NEGATIVE Final    Comment: (NOTE) SARS-CoV-2 target nucleic acids are NOT DETECTED.  The SARS-CoV-2 RNA is generally detectable in upper respiratory specimens during the acute phase of infection. The lowest concentration of SARS-CoV-2 viral copies this assay can detect is 138 copies/mL.  A negative result does not preclude SARS-Cov-2 infection and should not be used as the sole basis for treatment or other patient management decisions. A negative result may occur with  improper specimen collection/handling, submission of specimen other than nasopharyngeal swab, presence of viral mutation(s) within the areas targeted by this assay, and inadequate number of viral copies(<138 copies/mL). A negative result must be combined with clinical observations, patient history, and epidemiological information. The expected result is Negative.  Fact Sheet for Patients:  EntrepreneurPulse.com.au  Fact Sheet for Healthcare Providers:  IncredibleEmployment.be  This test is no t yet approved or cleared by the [place] FDA and  has been authorized for detection and/or diagnosis of SARS-CoV-2 by FDA under an Emergency Use Authorization (EUA). This EUA will remain  in effect (meaning this test can be used) for the duration of the COVID-19 declaration under Section 564(b)(1) of the Act, 21 U.S.C.section 360bbb-3(b)(1), unless the authorization  is terminated  or revoked sooner.       Influenza A by PCR NEGATIVE NEGATIVE Final   Influenza B by PCR NEGATIVE NEGATIVE Final    Comment: (NOTE) The Xpert Xpress SARS-CoV-2/FLU/RSV plus assay is intended as an aid in the diagnosis of influenza from Nasopharyngeal swab specimens and should not be used as a sole basis for treatment. Nasal washings and aspirates are unacceptable for Xpert Xpress SARS-CoV-2/FLU/RSV testing.  Fact Sheet for Patients: EntrepreneurPulse.com.au  Fact Sheet for Healthcare Providers: IncredibleEmployment.be  This test is not yet approved or cleared by the [place] FDA and has been authorized for detection and/or diagnosis of SARS-CoV-2 by FDA under an Emergency Use Authorization (EUA). This EUA will remain in effect (meaning this test can be used) for the duration of the COVID-19 declaration under Section 564(b)(1) of the Act, 21 U.S.C. section 360bbb-3(b)(1), unless the authorization is terminated or revoked.     Resp Syncytial Virus by PCR NEGATIVE NEGATIVE Final    Comment: (NOTE) Fact Sheet for Patients: EntrepreneurPulse.com.au  Fact Sheet for Healthcare Providers: IncredibleEmployment.be  This test is not yet approved or cleared by the [place] FDA and has been authorized for detection and/or diagnosis of SARS-CoV-2 by FDA under an Emergency Use Authorization (EUA). This EUA will remain in effect (meaning this test can be used) for the duration of the COVID-19 declaration under Section 564(b)(1) of the Act, 21 U.S.C. section 360bbb-3(b)(1), unless the authorization is terminated or revoked.  Performed at [facility], [place] [address] [place], [place] [address]   Respiratory (~20 pathogens) panel by PCR     Status: None   Collection Time: 08/13/22  8:37 PM   Specimen: Nasopharyngeal Swab; Respiratory  Result Value Ref Range Status    Adenovirus NOT DETECTED NOT DETECTED Final   Coronavirus 229E NOT DETECTED NOT DETECTED Final    Comment: (NOTE) The Coronavirus on the Respiratory Panel, DOES NOT test for the novel  Coronavirus (2019 nCoV)    Coronavirus HKU1 NOT DETECTED NOT DETECTED Final   Coronavirus NL63 NOT DETECTED NOT DETECTED Final   Coronavirus OC43 NOT DETECTED NOT DETECTED Final   Metapneumovirus NOT DETECTED NOT DETECTED Final   Rhinovirus / Enterovirus NOT DETECTED NOT DETECTED Final   Influenza A NOT DETECTED NOT DETECTED Final   Influenza B NOT DETECTED NOT DETECTED Final   Parainfluenza Virus 1 NOT DETECTED NOT DETECTED Final   Parainfluenza Virus 2 NOT DETECTED NOT DETECTED Final   Parainfluenza Virus 3 NOT DETECTED NOT DETECTED Final   Parainfluenza Virus 4 NOT  DETECTED NOT DETECTED Final   Respiratory Syncytial Virus NOT DETECTED NOT DETECTED Final   Bordetella pertussis NOT DETECTED NOT DETECTED Final   Bordetella Parapertussis NOT DETECTED NOT DETECTED Final   Chlamydophila pneumoniae NOT DETECTED NOT DETECTED Final   Mycoplasma pneumoniae NOT DETECTED NOT DETECTED Final    Comment: Performed at [facility], [place] [address], [place], [place] [address]  Gastrointestinal Panel by PCR , Stool     Status: None   Collection Time: 08/17/22 10:50 AM   Specimen: Stool  Result Value Ref Range Status   Campylobacter species NOT DETECTED NOT DETECTED Final   Plesimonas shigelloides NOT DETECTED NOT DETECTED Final   Salmonella species NOT DETECTED NOT DETECTED Final   Yersinia enterocolitica NOT DETECTED NOT DETECTED Final   Vibrio species NOT DETECTED NOT DETECTED Final   Vibrio cholerae NOT DETECTED NOT DETECTED Final   Enteroaggregative E coli (EAEC) NOT DETECTED NOT DETECTED Final   Enteropathogenic E coli (EPEC) NOT DETECTED NOT DETECTED Final   Enterotoxigenic E coli (ETEC) NOT DETECTED NOT DETECTED Final   Shiga like toxin producing E coli (STEC) NOT DETECTED NOT DETECTED Final    Shigella/Enteroinvasive E coli (EIEC) NOT DETECTED NOT DETECTED Final   Cryptosporidium NOT DETECTED NOT DETECTED Final   Cyclospora cayetanensis NOT DETECTED NOT DETECTED Final   Entamoeba histolytica NOT DETECTED NOT DETECTED Final   Giardia lamblia NOT DETECTED NOT DETECTED Final   Adenovirus F40/41 NOT DETECTED NOT DETECTED Final   Astrovirus NOT DETECTED NOT DETECTED Final   Norovirus GI/GII NOT DETECTED NOT DETECTED Final   Rotavirus A NOT DETECTED NOT DETECTED Final   Sapovirus (I, II, IV, and V) NOT DETECTED NOT DETECTED Final    Comment: Performed at [facility], [address], [place], [place] [address]    Please note: You were cared for by a hospitalist during your hospital stay. Once you are discharged, your primary care physician will handle any further medical issues. Please note that NO REFILLS for any discharge medications will be authorized once you are discharged, as it is imperative that you return to your primary care physician (or establish a relationship with a primary care physician if you do not have one) for your post hospital discharge needs so that they can reassess your need for medications and monitor your lab values.    Time coordinating discharge: 40 minutes  SIGNED:   Shelly Coss, MD  [facility] 08/18/2022, 9:46 AM Pager [phone]  If 7PM-7AM, please contact night-coverage [email or web address] Password [email or web address]

## 2022-08-31 DIAGNOSIS — Z8719 Personal history of other diseases of the digestive system: Secondary | ICD-10-CM | POA: Diagnosis not present

## 2022-08-31 DIAGNOSIS — K859 Acute pancreatitis without necrosis or infection, unspecified: Secondary | ICD-10-CM | POA: Diagnosis not present

## 2022-08-31 DIAGNOSIS — G629 Polyneuropathy, unspecified: Secondary | ICD-10-CM | POA: Diagnosis not present

## 2022-08-31 DIAGNOSIS — N2889 Other specified disorders of kidney and ureter: Secondary | ICD-10-CM | POA: Diagnosis not present

## 2022-08-31 DIAGNOSIS — K2980 Duodenitis without bleeding: Secondary | ICD-10-CM | POA: Diagnosis not present

## 2022-09-02 ENCOUNTER — Other Ambulatory Visit: Payer: Self-pay | Admitting: Family Medicine

## 2022-09-02 DIAGNOSIS — Z9889 Other specified postprocedural states: Secondary | ICD-10-CM | POA: Diagnosis not present

## 2022-09-02 DIAGNOSIS — H2513 Age-related nuclear cataract, bilateral: Secondary | ICD-10-CM | POA: Diagnosis not present

## 2022-09-02 DIAGNOSIS — H35352 Cystoid macular degeneration, left eye: Secondary | ICD-10-CM | POA: Diagnosis not present

## 2022-09-02 DIAGNOSIS — H353132 Nonexudative age-related macular degeneration, bilateral, intermediate dry stage: Secondary | ICD-10-CM | POA: Diagnosis not present

## 2022-09-02 DIAGNOSIS — H35372 Puckering of macula, left eye: Secondary | ICD-10-CM | POA: Diagnosis not present

## 2022-09-02 DIAGNOSIS — N2889 Other specified disorders of kidney and ureter: Secondary | ICD-10-CM

## 2022-09-14 DIAGNOSIS — N481 Balanitis: Secondary | ICD-10-CM | POA: Diagnosis not present

## 2022-09-14 DIAGNOSIS — D49511 Neoplasm of unspecified behavior of right kidney: Secondary | ICD-10-CM | POA: Diagnosis not present

## 2022-09-14 DIAGNOSIS — C61 Malignant neoplasm of prostate: Secondary | ICD-10-CM | POA: Diagnosis not present

## 2022-09-30 ENCOUNTER — Ambulatory Visit
Admission: RE | Admit: 2022-09-30 | Discharge: 2022-09-30 | Disposition: A | Discharge status: Home / Self Care | Payer: Medicare Other | Source: Ambulatory Visit | Attending: Family Medicine | Admitting: Family Medicine

## 2022-09-30 DIAGNOSIS — N2889 Other specified disorders of kidney and ureter: Secondary | ICD-10-CM

## 2022-09-30 MED ORDER — GADOPICLENOL 0.5 MMOL/ML IV SOLN
10.0000 mL | Freq: Once | INTRAVENOUS | Status: AC | PRN
  Administered 2022-09-30: 10 mL via INTRAVENOUS

## 2022-10-04 DIAGNOSIS — D49511 Neoplasm of unspecified behavior of right kidney: Secondary | ICD-10-CM | POA: Diagnosis not present

## 2022-10-06 ENCOUNTER — Other Ambulatory Visit: Payer: Self-pay | Admitting: Urology

## 2022-10-06 DIAGNOSIS — D49511 Neoplasm of unspecified behavior of right kidney: Secondary | ICD-10-CM

## 2022-10-12 ENCOUNTER — Other Ambulatory Visit (HOSPITAL_COMMUNITY): Payer: Self-pay | Admitting: Diagnostic Radiology

## 2022-10-12 ENCOUNTER — Ambulatory Visit
Admission: RE | Admit: 2022-10-12 | Discharge: 2022-10-12 | Disposition: A | Discharge status: Home / Self Care | Payer: Medicare Other | Source: Ambulatory Visit | Attending: Urology | Admitting: Urology

## 2022-10-12 DIAGNOSIS — N2889 Other specified disorders of kidney and ureter: Secondary | ICD-10-CM

## 2022-10-12 DIAGNOSIS — D49511 Neoplasm of unspecified behavior of right kidney: Secondary | ICD-10-CM

## 2022-10-12 NOTE — Consult Note (Signed)
Chief Complaint: Patient was seen in consultation today for right renal lesion evaluation  at the request of [place]  Referring Physician(s): Borden,Lester  History of Present Illness: Thomas Guerrero is a 77 y.o. male with an incidentally discovered right renal lesion while the patient was being worked up for abdominal pain and nausea.  Patient was admitted to the hospital on 08/14/2022 for pancreatitis and dehydration.  MRCP and subsequent MRI of the abdomen demonstrates a 2.4 cm enhancing lesion along the inferior medial aspect of the right kidney.  Patient has a history of prostate cancer and status post radiation treatment.  Patient denies urinary symptoms.  Specifically denies flank pain, dysuria or hematuria.  His abdominal symptoms and nausea have resolved.  Past medical history is significant for chronic kidney disease (CKD stage 4), monoclonal gammopathy of unknown significance, hypertension, prostate cancer, hypertension and hyperlipidemia.  Currently, the patient has no complaints.  Patient discussed management options for the right renal lesion with Dr. Alinda Money in urology.  They discussed observation, biopsy, surgical resection and percutaneous ablation.  Patient was referred to interventional radiology to discuss potential percutaneous ablation of the right renal lesion.  Past Medical History:  Diagnosis Date   Chronic kidney disease    GERD (gastroesophageal reflux disease)    High cholesterol    Hx of gout    Hypertension    MGUS (monoclonal gammopathy of unknown significance)    Prostate cancer [facility])     Past Surgical History:  Procedure Laterality Date   Biopsy of the Prostate     COLONOSCOPY  10/17/2016   MOHS SURGERY  04/2019   forehead    Allergies: Patient has no known allergies.  Medications: Prior to Admission medications   Medication Sig Start Date End Date Taking? Authorizing Provider  allopurinol (ZYLOPRIM) 100 MG tablet Take 100 mg by mouth  daily. 07/24/19   [provider]  amLODipine (NORVASC) 2.5 MG tablet Take 5 mg by mouth daily. 05/27/21   [provider]  atorvastatin (LIPITOR) 10 MG tablet Take 10 mg by mouth daily. 09/19/16   [provider]  cholecalciferol (VITAMIN D) 1000 units tablet Take 1,000 Units by mouth daily.    [provider]  Cyanocobalamin (VITAMIN B-12 PO)     [provider]  ferrous sulfate 325 (65 FE) MG tablet Take 325 mg by mouth daily with breakfast.    [provider]  gabapentin (NEURONTIN) 300 MG capsule Take 1 capsule (300 mg total) by mouth 3 (three) times daily. Patient taking differently: Take 300 mg by mouth 2 (two) times daily. 07/05/21   Lomax, Amy, NP  lisinopril (ZESTRIL) 20 MG tablet Take 20 mg by mouth daily. 06/09/21   [provider]  loperamide (IMODIUM A-D) 2 MG tablet Take 1 tablet (2 mg total) by mouth 3 (three) times daily as needed for diarrhea or loose stools. 08/18/22   Shelly Coss, MD  ondansetron (ZOFRAN) 4 MG tablet Take 4 mg by mouth every 8 (eight) hours as needed for nausea or vomiting. 08/11/22   [provider]  pantoprazole (PROTONIX) 40 MG tablet Take 1 tablet (40 mg total) by mouth 2 (two) times daily. 08/18/22 09/17/22  Shelly Coss, MD  sodium bicarbonate 650 MG tablet Take 1 tablet (650 mg total) by mouth 2 (two) times daily. 08/18/22   Shelly Coss, MD  triamcinolone cream (KENALOG) 0.1 % Apply 1 application  topically 2 (two) times daily as needed (rash). 04/18/16   [provider]  Family History  Problem Relation Age of Onset   Cancer Son        leukemia   Neuropathy Neg Hx     Social History   Socioeconomic History   Marital status: Married    Spouse name: Not on file   Number of children: 2   Years of education: Some college   Highest education level: Not on file  Occupational History   Occupation: Retired  Tobacco Use   Smoking status: Former    Years: 25.00     Types: Cigarettes    Start date: 08/08/1996   Smokeless tobacco: Never  Vaping Use   Vaping Use: Never used  Substance and Sexual Activity   Alcohol use: Not Currently    Alcohol/week: 0.0 standard drinks of alcohol   Drug use: No   Sexual activity: Yes  Other Topics Concern   Not on file  Social History Narrative   Lives at home w/ his wife and daughter   Right-handed   Caffeine: 1 soda per day   Social Determinants of Health   Financial Resource Strain: Not on file  Food Insecurity: No Food Insecurity (08/14/2022)   Hunger Vital Sign    Worried About Running Out of Food in the Last Year: Never true    Ran Out of Food in the Last Year: Never true  Transportation Needs: No Transportation Needs (08/14/2022)   PRAPARE - Hydrologist (Medical): No    Lack of Transportation (Non-Medical): No  Physical Activity: Not on file  Stress: Not on file  Social Connections: Not on file    ECOG Status: 1 - Symptomatic but completely ambulatory   Review of Systems  Constitutional:  Positive for unexpected weight change.  Respiratory: Negative.    Cardiovascular: Negative.   Gastrointestinal:  Negative for abdominal pain, diarrhea and vomiting.  Genitourinary:  Negative for difficulty urinating, dysuria, flank pain and hematuria.    Vital Signs: BP (!) 153/61 (BP Location: Left Arm, Patient Position: Sitting, Cuff Size: Normal)   Pulse 62   Temp 98.2 F (36.8 C) (Oral)   Wt 90.7 kg   SpO2 98% Comment: room air  BMI 30.41 kg/m     Physical Exam Constitutional:      Appearance: He is obese.  Cardiovascular:     Rate and Rhythm: Normal rate and regular rhythm.  Pulmonary:     Effort: Pulmonary effort is normal.     Breath sounds: Normal breath sounds.  Abdominal:     General: Abdomen is flat. There is no distension.     Palpations: Abdomen is soft.     Tenderness: There is no abdominal tenderness.  Neurological:     Mental Status: He is  alert.      Imaging: MR ABDOMEN WWO CONTRAST  Result Date: 10/01/2022 CLINICAL DATA:  Renal mass EXAM: MRI ABDOMEN WITHOUT AND WITH CONTRAST TECHNIQUE: Multiplanar multisequence MR imaging of the abdomen was performed both before and after the administration of intravenous contrast. CONTRAST:  10 mL Vueway gadolinium contrast IV COMPARISON:  08/14/2022 FINDINGS: Lower chest: No acute abnormality. Hepatobiliary: No solid liver abnormality is seen. No gallstones, gallbladder wall thickening, or biliary dilatation. Pancreas: Unremarkable. No pancreatic ductal dilatation or surrounding inflammatory changes. Spleen: Normal in size without significant abnormality. Adrenals/Urinary Tract: Adrenal glands are unremarkable. Brightly contrast enhancing, partially exophytic mass of the medial inferior pole of the right kidney measuring 2.4 x 2.2 cm (series 12, image 73). Small bilateral parapelvic renal  cysts. The left kidney is otherwise normal, without obvious renal calculi, solid lesion, or hydronephrosis. Stomach/Bowel: Stomach is within normal limits. No evidence of bowel wall thickening, distention, or inflammatory changes. Vascular/Lymphatic: No significant vascular findings are present. No enlarged abdominal lymph nodes. Other: No abdominal wall hernia or abnormality. No ascites. Musculoskeletal: No acute or significant osseous findings. IMPRESSION: 1. Brightly enhancing, partially exophytic mass of the medial inferior pole of the right kidney measuring 2.4 x 2.2 cm, consistent with renal cell carcinoma. 2. No evidence of renal vein invasion, lymphadenopathy, or metastatic disease in the abdomen. Electronically Signed   By: Delanna Ahmadi M.D.   On: 10/01/2022 21:56    Labs:  CBC: Recent Labs    08/14/22 0557 08/15/22 0600 08/16/22 0550 08/18/22 0626  WBC 9.3 7.3 6.8 5.1  HGB 10.3* 9.3* 9.2* 8.5*  HCT 30.8* 28.4* 27.8* 26.0*  PLT 177 154 141* 125*    COAGS: No results for input(s): "INR", "APTT"  in the last 8760 hours.  BMP: Recent Labs    08/14/22 0557 08/15/22 0600 08/16/22 0550 08/18/22 0626  NA 134* 133* 134* 139  K 4.3 4.1 4.2 3.8  CL 107 106 106 113*  CO2 19* 18* 19* 20*  GLUCOSE 99 88 76 103*  BUN 42* 38* 31* 29*  CALCIUM 9.3 8.9 9.0 8.5*  CREATININE 1.89* 1.77* 1.90* 2.23*  GFRNONAA 36* 39* 36* 30*    LIVER FUNCTION TESTS: Recent Labs    08/13/22 2109 08/14/22 0557 08/15/22 0600 08/16/22 0550  BILITOT 1.3* 0.8 0.9 1.2  AST 20 13* 13* 14*  ALT '13 11 10 10  '$ ALKPHOS 64 52 48 46  PROT 8.1 6.1* 5.6* 5.1*  ALBUMIN 4.2 3.0* 2.9* 2.6*    TUMOR MARKERS: No results for input(s): "AFPTM", "CEA", "CA199", "CHROMGRNA" in the last 8760 hours.  Assessment and Plan:  77 year old male with an enhancing 2.4 cm lesion in the right kidney lower pole.  Imaging findings are suggestive for a renal cell carcinoma and no evidence for metastatic disease at this time. The lesion appears to have central necrosis or a cystic component but there is peripheral enhancement of the lesion and this likely represents a renal cell carcinoma although cannot exclude a benign tumor.  We discussed different treatment options including biopsy, observation and ablation with biopsy.  Based on the size and location of the lesion, the lesion is amendable for percutaneous ablation.  Explained to the patient that there is always a risk of incomplete treatment with ablation but the chances of complete treatment are much better with a lesion of this size.  Discussed the benefits of percutaneous ablation including the minimally invasive technique and the fact that it can be performed usually as an outpatient or overnight observation.  We discussed potential risks of the procedure including bleeding, infection, incomplete treatment and damage to normal renal tissue and renal function.  Ablation of this lesion is a good nephron sparing option based on his chronic kidney disease.  My primary technical concern for  treating this lesion would be the close proximity to the proximal right ureter.  However, there appears to be enough space between the lesion and the right ureter and this can be managed with patient positioning and hydrodissection during the procedure if needed.  Patient appears to have a good understanding of the risk and benefits of the procedure.  He would like to proceed with an image guided cryoablation of the right renal lesion with biopsy.  Will plan to  perform the procedure with general anesthesia and will be prepared to have the patient stay overnight for observation if needed.  Thank you for this interesting consult.  I greatly enjoyed meeting [employer] and look forward to participating in their care.  A copy of this report was sent to the requesting provider on this date.  Electronically Signed: Burman Riis 10/12/2022, 9:09 AM   I spent a total of  30 Minutes   in face to face in clinical consultation, greater than 50% of which was counseling/coordinating care for the right renal lesion.   Total time also includes reviewing the images and chart.

## 2022-10-26 DIAGNOSIS — D49511 Neoplasm of unspecified behavior of right kidney: Secondary | ICD-10-CM | POA: Diagnosis not present

## 2022-11-09 DIAGNOSIS — N1832 Chronic kidney disease, stage 3b: Secondary | ICD-10-CM | POA: Diagnosis not present

## 2022-11-09 DIAGNOSIS — N189 Chronic kidney disease, unspecified: Secondary | ICD-10-CM | POA: Diagnosis not present

## 2022-11-11 DIAGNOSIS — M109 Gout, unspecified: Secondary | ICD-10-CM | POA: Diagnosis not present

## 2022-11-11 DIAGNOSIS — N184 Chronic kidney disease, stage 4 (severe): Secondary | ICD-10-CM | POA: Diagnosis not present

## 2022-11-11 DIAGNOSIS — Z8546 Personal history of malignant neoplasm of prostate: Secondary | ICD-10-CM | POA: Diagnosis not present

## 2022-11-11 DIAGNOSIS — I129 Hypertensive chronic kidney disease with stage 1 through stage 4 chronic kidney disease, or unspecified chronic kidney disease: Secondary | ICD-10-CM | POA: Diagnosis not present

## 2022-11-11 DIAGNOSIS — N2889 Other specified disorders of kidney and ureter: Secondary | ICD-10-CM | POA: Diagnosis not present

## 2022-11-11 DIAGNOSIS — D472 Monoclonal gammopathy: Secondary | ICD-10-CM | POA: Diagnosis not present

## 2022-11-11 DIAGNOSIS — E7800 Pure hypercholesterolemia, unspecified: Secondary | ICD-10-CM | POA: Diagnosis not present

## 2022-11-11 DIAGNOSIS — G629 Polyneuropathy, unspecified: Secondary | ICD-10-CM | POA: Diagnosis not present

## 2022-11-15 DIAGNOSIS — N2889 Other specified disorders of kidney and ureter: Secondary | ICD-10-CM | POA: Diagnosis not present

## 2022-11-15 DIAGNOSIS — E559 Vitamin D deficiency, unspecified: Secondary | ICD-10-CM | POA: Diagnosis not present

## 2022-11-15 DIAGNOSIS — E872 Acidosis, unspecified: Secondary | ICD-10-CM | POA: Diagnosis not present

## 2022-11-15 DIAGNOSIS — D631 Anemia in chronic kidney disease: Secondary | ICD-10-CM | POA: Diagnosis not present

## 2022-11-15 DIAGNOSIS — N2581 Secondary hyperparathyroidism of renal origin: Secondary | ICD-10-CM | POA: Diagnosis not present

## 2022-11-15 DIAGNOSIS — I129 Hypertensive chronic kidney disease with stage 1 through stage 4 chronic kidney disease, or unspecified chronic kidney disease: Secondary | ICD-10-CM | POA: Diagnosis not present

## 2022-11-15 DIAGNOSIS — N1832 Chronic kidney disease, stage 3b: Secondary | ICD-10-CM | POA: Diagnosis not present

## 2022-11-16 ENCOUNTER — Encounter (HOSPITAL_COMMUNITY): Payer: Self-pay | Admitting: Family Medicine

## 2022-11-22 NOTE — Progress Notes (Signed)
Surgery orders requested with radiology. 

## 2022-11-23 ENCOUNTER — Other Ambulatory Visit: Payer: Self-pay | Admitting: Student

## 2022-11-23 DIAGNOSIS — N2889 Other specified disorders of kidney and ureter: Secondary | ICD-10-CM

## 2022-11-27 NOTE — Progress Notes (Addendum)
COVID Vaccine received:  []  No [x]  Yes Date of any COVID positive Test in last 90 days:  None  PCP - Irven Coe MD Cardiologist - none Urology-  Heloise Purpura, MD Nephrology-  Crista Elliot, MD  Chest x-ray - 08-09-2022  2v  Epic EKG -  08-13-2022  Epic Stress Test -  ECHO -  Cardiac Cath -   PCR screen: []  Ordered & Completed           []   No Order but Needs PROFEND           [x]   N/A for this surgery  Surgery Plan:  []  Ambulatory                            [x]  Outpatient in bed                            []  Admit  Anesthesia:    [x]  General  []  Spinal                           []   Choice []   MAC  Bowel Prep - [x]  No  []   Yes ______  Pacemaker / ICD device [x]  No []  Yes   Spinal Cord Stimulator:[x]  No []  Yes       History of Sleep Apnea? [x]  No []  Yes   CPAP used?- [x]  No []  Yes    Does the patient monitor blood sugar?          []  No []  Yes  [x]  N/A  Patient has: [x]  NO Hx DM   []  Pre-DM                 []  DM1  []   DM2  Blood Thinner / Instructions: none Aspirin Instructions: none  ERAS Protocol Ordered: [x]  No  []  Yes Patient is to be NPO after: Midnight Prior  Activity level: Patient is able to climb a flight of stairs without difficulty; []  No CP  []  No SOB, but would have ___   Patient can / can not perform ADLs without assistance.   Anesthesia review: HTN, CKD4, Prostate Ca, Gout, MGUS,   Patient denies shortness of breath, fever, cough and chest pain at PAT appointment.  Patient verbalized understanding and agreement to the Pre-Surgical Instructions that were given to them at this PAT appointment. Patient was also educated of the need to review these PAT instructions again prior to his surgery.I reviewed the appropriate phone numbers to call if they have any and questions or concerns.

## 2022-11-27 NOTE — Patient Instructions (Signed)
SURGICAL WAITING ROOM VISITATION Patients having surgery or a procedure may have no more than 2 support people in the waiting area - these visitors may rotate in the visitor waiting room.   Due to an increase in RSV and influenza rates and associated hospitalizations, children ages 58 and under may not visit patients in [facility] hospitals. If the patient needs to stay at the hospital during part of their recovery, the visitor guidelines for inpatient rooms apply.  PRE-OP VISITATION  Pre-op nurse will coordinate an appropriate time for 1 support person to accompany the patient in pre-op.  This support person may not rotate.  This visitor will be contacted when the time is appropriate for the visitor to come back in the pre-op area.  Please refer to the [facility] website for the visitor guidelines for Inpatients (after your surgery is over and you are in a regular room).  You are not required to quarantine at this time prior to your surgery. However, you must do this: Hand Hygiene often Do NOT share personal items Notify your provider if you are in close contact with someone who has COVID or you develop fever 100.4 or greater, new onset of sneezing, cough, sore throat, shortness of breath or body aches.  If you test positive for Covid or have been in contact with anyone that has tested positive in the last 10 days please notify you surgeon.    Your procedure is scheduled on:  Wednesday  Dec 07, 2022  Report to [facility] Main Entrance: [facility] entrance where the [employer] is available.   Report to admitting at: 10:00  AM  +++++Call this number if you have any questions or problems the morning of surgery [phone]  DO NOT EAT OR DRINK ANYTHING AFTER MIDNIGHT THE NIGHT PRIOR TO YOUR SURGERY / PROCEDURE.   FOLLOW BOWEL PREP AND ANY ADDITIONAL PRE OP INSTRUCTIONS YOU RECEIVED FROM YOUR SURGEON'S OFFICE!!!   Oral Hygiene is also important to reduce your risk of infection.         Remember - BRUSH YOUR TEETH THE MORNING OF SURGERY WITH YOUR REGULAR TOOTHPASTE  Do NOT smoke after Midnight the night before surgery.  Take ONLY these medicines the morning of surgery with A SIP OF WATER: allopurinol, amlodipine, gabapentin.  You may take Tylenol if needed for pain.                    You may not have any metal on your body including  jewelry, and body piercing  Do not wear lotions, powders, cologne, or deodorant  Men may shave face and neck.  Contacts, Hearing Aids, dentures or bridgework may not be worn into surgery. DENTURES WILL BE REMOVED PRIOR TO SURGERY PLEASE DO NOT APPLY "Poly grip" OR ADHESIVES!!!  You may bring a small overnight bag with you on the day of surgery, only pack items that are not valuable. [facility] IS NOT RESPONSIBLE   FOR VALUABLES THAT ARE LOST OR STOLEN.   Do not bring your home medications to the hospital. The Pharmacy will dispense medications listed on your medication list to you during your admission in the Hospital.  Special Instructions: Bring a copy of your healthcare power of attorney and living will documents the day of surgery, if you wish to have them scanned into your [facility] Records- EPIC  Please read over the following fact sheets you were given: IF YOU HAVE QUESTIONS ABOUT YOUR PRE-OP INSTRUCTIONS, PLEASE CALL [phone].  [facility] - Preparing for Surgery Before surgery, you can play an important role.  Because skin is not sterile, your skin needs to be as free of germs as possible.  You can reduce the number of germs on your skin by washing with CHG (chlorahexidine gluconate) soap before surgery.  CHG is an antiseptic cleaner which kills germs and bonds with the skin to continue killing germs even after washing. Please DO NOT use if you have an allergy to CHG or antibacterial soaps.  If your skin becomes reddened/irritated stop using the CHG and inform your nurse when you arrive at Short Stay. Do not  shave (including legs and underarms) for at least 48 hours prior to the first CHG shower.  You may shave your face/neck.  Please follow these instructions carefully:  1.  Shower with CHG Soap the night before surgery and the  morning of surgery.  2.  If you choose to wash your hair, wash your hair first as usual with your normal  shampoo.  3.  After you shampoo, rinse your hair and body thoroughly to remove the shampoo.                             4.  Use CHG as you would any other liquid soap.  You can apply chg directly to the skin and wash.  Gently with a scrungie or clean washcloth.  5.  Apply the CHG Soap to your body ONLY FROM THE NECK DOWN.   Do not use on face/ open                           Wound or open sores. Avoid contact with eyes, ears mouth and genitals (private parts).                       Wash face,  Genitals (private parts) with your normal soap.             6.  Wash thoroughly, paying special attention to the area where your  surgery  will be performed.  7.  Thoroughly rinse your body with warm water from the neck down.  8.  DO NOT shower/wash with your normal soap after using and rinsing off the CHG Soap.            9.  Pat yourself dry with a clean towel.            10.  Wear clean pajamas.            11.  Place clean sheets on your bed the night of your first shower and do not  sleep with pets.  ON THE DAY OF SURGERY : Do not apply any lotions/deodorants the morning of surgery.  Please wear clean clothes to the hospital/surgery center.    FAILURE TO FOLLOW THESE INSTRUCTIONS MAY RESULT IN THE CANCELLATION OF YOUR SURGERY  PATIENT SIGNATURE_________________________________  NURSE SIGNATURE__________________________________  ________________________________________________________________________

## 2022-11-29 ENCOUNTER — Encounter (HOSPITAL_COMMUNITY): Payer: Self-pay

## 2022-11-29 ENCOUNTER — Other Ambulatory Visit: Payer: Self-pay

## 2022-11-29 ENCOUNTER — Ambulatory Visit (HOSPITAL_COMMUNITY)
Admission: RE | Admit: 2022-11-29 | Discharge: 2022-11-29 | Disposition: A | Discharge status: Home / Self Care | Payer: Medicare Other | Source: Ambulatory Visit | Attending: Student | Admitting: Student

## 2022-11-29 ENCOUNTER — Encounter (HOSPITAL_COMMUNITY)
Admission: RE | Admit: 2022-11-29 | Discharge: 2022-11-29 | Disposition: A | Discharge status: Home / Self Care | Payer: Medicare Other | Source: Ambulatory Visit | Attending: Diagnostic Radiology | Admitting: Diagnostic Radiology

## 2022-11-29 VITALS — BP 146/64 | HR 80 | Temp 98.6°F | Resp 18 | Ht 68.0 in | Wt 197.0 lb

## 2022-11-29 DIAGNOSIS — N2889 Other specified disorders of kidney and ureter: Secondary | ICD-10-CM | POA: Insufficient documentation

## 2022-11-29 DIAGNOSIS — N184 Chronic kidney disease, stage 4 (severe): Secondary | ICD-10-CM | POA: Insufficient documentation

## 2022-11-29 DIAGNOSIS — I129 Hypertensive chronic kidney disease with stage 1 through stage 4 chronic kidney disease, or unspecified chronic kidney disease: Secondary | ICD-10-CM | POA: Diagnosis not present

## 2022-11-29 DIAGNOSIS — I10 Essential (primary) hypertension: Secondary | ICD-10-CM

## 2022-11-29 DIAGNOSIS — Z01818 Encounter for other preprocedural examination: Secondary | ICD-10-CM | POA: Insufficient documentation

## 2022-11-29 DIAGNOSIS — Z01810 Encounter for preprocedural cardiovascular examination: Secondary | ICD-10-CM | POA: Diagnosis not present

## 2022-11-29 HISTORY — DX: Unspecified osteoarthritis, unspecified site: M19.90

## 2022-11-29 HISTORY — DX: Anemia, unspecified: D64.9

## 2022-11-29 LAB — CBC
HCT: 33.6 % — ABNORMAL LOW (ref 39.0–52.0)
Hemoglobin: 10.9 g/dL — ABNORMAL LOW (ref 13.0–17.0)
MCH: 35.6 pg — ABNORMAL HIGH (ref 26.0–34.0)
MCHC: 32.4 g/dL (ref 30.0–36.0)
MCV: 109.8 fL — ABNORMAL HIGH (ref 80.0–100.0)
Platelets: 165 10*3/uL (ref 150–400)
RBC: 3.06 MIL/uL — ABNORMAL LOW (ref 4.22–5.81)
RDW: 12.8 % (ref 11.5–15.5)
WBC: 6.2 10*3/uL (ref 4.0–10.5)
nRBC: 0 % (ref 0.0–0.2)

## 2022-11-29 LAB — BASIC METABOLIC PANEL
Anion gap: 10 (ref 5–15)
BUN: 34 mg/dL — ABNORMAL HIGH (ref 8–23)
CO2: 21 mmol/L — ABNORMAL LOW (ref 22–32)
Calcium: 9.6 mg/dL (ref 8.9–10.3)
Chloride: 108 mmol/L (ref 98–111)
Creatinine, Ser: 2.16 mg/dL — ABNORMAL HIGH (ref 0.61–1.24)
GFR, Estimated: 31 mL/min — ABNORMAL LOW (ref >60)
Glucose, Bld: 115 mg/dL — ABNORMAL HIGH (ref 70–99)
Potassium: 4.7 mmol/L (ref 3.5–5.1)
Sodium: 139 mmol/L (ref 135–145)

## 2022-11-29 LAB — TYPE AND SCREEN: ABO/RH(D): A POS

## 2022-12-06 ENCOUNTER — Other Ambulatory Visit: Payer: Self-pay | Admitting: Internal Medicine

## 2022-12-07 ENCOUNTER — Encounter (HOSPITAL_COMMUNITY)
Admission: RE | Disposition: A | Discharge status: Home / Self Care | Payer: Self-pay | Source: Ambulatory Visit | Attending: Diagnostic Radiology

## 2022-12-07 ENCOUNTER — Ambulatory Visit (HOSPITAL_BASED_OUTPATIENT_CLINIC_OR_DEPARTMENT_OTHER): Payer: Medicare Other | Admitting: Anesthesiology

## 2022-12-07 ENCOUNTER — Encounter (HOSPITAL_COMMUNITY): Payer: Self-pay

## 2022-12-07 ENCOUNTER — Other Ambulatory Visit: Payer: Self-pay

## 2022-12-07 ENCOUNTER — Observation Stay (HOSPITAL_COMMUNITY)
Admission: RE | Admit: 2022-12-07 | Discharge: 2022-12-07 | Disposition: A | Discharge status: Home / Self Care | Payer: Medicare Other | Source: Ambulatory Visit | Attending: Diagnostic Radiology | Admitting: Diagnostic Radiology

## 2022-12-07 ENCOUNTER — Observation Stay (HOSPITAL_COMMUNITY)
Admission: RE | Admit: 2022-12-07 | Discharge: 2022-12-09 | Disposition: A | Discharge status: Home / Self Care | Payer: Medicare Other | Source: Ambulatory Visit | Attending: Diagnostic Radiology | Admitting: Diagnostic Radiology

## 2022-12-07 ENCOUNTER — Ambulatory Visit (HOSPITAL_COMMUNITY): Payer: Medicare Other | Admitting: Anesthesiology

## 2022-12-07 ENCOUNTER — Encounter (HOSPITAL_COMMUNITY): Payer: Self-pay | Admitting: Diagnostic Radiology

## 2022-12-07 DIAGNOSIS — I129 Hypertensive chronic kidney disease with stage 1 through stage 4 chronic kidney disease, or unspecified chronic kidney disease: Secondary | ICD-10-CM | POA: Insufficient documentation

## 2022-12-07 DIAGNOSIS — K219 Gastro-esophageal reflux disease without esophagitis: Secondary | ICD-10-CM | POA: Diagnosis not present

## 2022-12-07 DIAGNOSIS — I10 Essential (primary) hypertension: Secondary | ICD-10-CM | POA: Diagnosis not present

## 2022-12-07 DIAGNOSIS — D649 Anemia, unspecified: Secondary | ICD-10-CM | POA: Diagnosis not present

## 2022-12-07 DIAGNOSIS — R739 Hyperglycemia, unspecified: Secondary | ICD-10-CM | POA: Diagnosis not present

## 2022-12-07 DIAGNOSIS — Z87891 Personal history of nicotine dependence: Secondary | ICD-10-CM

## 2022-12-07 DIAGNOSIS — N289 Disorder of kidney and ureter, unspecified: Secondary | ICD-10-CM | POA: Diagnosis not present

## 2022-12-07 DIAGNOSIS — N2889 Other specified disorders of kidney and ureter: Secondary | ICD-10-CM

## 2022-12-07 DIAGNOSIS — N184 Chronic kidney disease, stage 4 (severe): Secondary | ICD-10-CM | POA: Diagnosis not present

## 2022-12-07 HISTORY — PX: RADIOLOGY WITH ANESTHESIA: SHX6223

## 2022-12-07 LAB — TYPE AND SCREEN: Antibody Screen: NEGATIVE

## 2022-12-07 LAB — PROTIME-INR
INR: 1 (ref 0.8–1.2)
Prothrombin Time: 13.1 seconds (ref 11.4–15.2)

## 2022-12-07 LAB — CBC
HCT: 31.8 % — ABNORMAL LOW (ref 39.0–52.0)
Hemoglobin: 10.4 g/dL — ABNORMAL LOW (ref 13.0–17.0)
MCH: 35.1 pg — ABNORMAL HIGH (ref 26.0–34.0)
MCHC: 32.7 g/dL (ref 30.0–36.0)
MCV: 107.4 fL — ABNORMAL HIGH (ref 80.0–100.0)
Platelets: 155 10*3/uL (ref 150–400)
RBC: 2.96 MIL/uL — ABNORMAL LOW (ref 4.22–5.81)
RDW: 12.5 % (ref 11.5–15.5)
WBC: 6.4 10*3/uL (ref 4.0–10.5)
nRBC: 0 % (ref 0.0–0.2)

## 2022-12-07 LAB — ABO/RH: ABO/RH(D): A POS

## 2022-12-07 SURGERY — IR WITH ANESTHESIA
Anesthesia: General | Laterality: Right

## 2022-12-07 MED ORDER — SODIUM CHLORIDE 0.9 % IV SOLN
INTRAVENOUS | Status: AC
  Filled 2022-12-07: qty 250

## 2022-12-07 MED ORDER — GABAPENTIN 100 MG PO CAPS
300.0000 mg | ORAL_CAPSULE | Freq: Two times a day (BID) | ORAL | Status: DC
  Administered 2022-12-07 – 2022-12-09 (×4): 300 mg via ORAL
  Filled 2022-12-07 (×4): qty 3

## 2022-12-07 MED ORDER — ATORVASTATIN CALCIUM 10 MG PO TABS
10.0000 mg | ORAL_TABLET | Freq: Every day | ORAL | Status: DC
  Administered 2022-12-08 – 2022-12-09 (×2): 10 mg via ORAL
  Filled 2022-12-07 (×2): qty 1

## 2022-12-07 MED ORDER — ALLOPURINOL 100 MG PO TABS
100.0000 mg | ORAL_TABLET | Freq: Every day | ORAL | Status: DC
  Administered 2022-12-08 – 2022-12-09 (×2): 100 mg via ORAL
  Filled 2022-12-07 (×2): qty 1

## 2022-12-07 MED ORDER — LIDOCAINE 2% (20 MG/ML) 5 ML SYRINGE
INTRAMUSCULAR | Status: DC | PRN
  Administered 2022-12-07: 80 mg via INTRAVENOUS

## 2022-12-07 MED ORDER — CEFAZOLIN SODIUM-DEXTROSE 2-3 GM-%(50ML) IV SOLR
INTRAVENOUS | Status: DC | PRN
  Administered 2022-12-07: 2 g via INTRAVENOUS

## 2022-12-07 MED ORDER — SODIUM CHLORIDE 0.9 % IV SOLN: INTRAVENOUS | Status: DC

## 2022-12-07 MED ORDER — FENTANYL CITRATE PF 50 MCG/ML IJ SOSY: 25.0000 ug | PREFILLED_SYRINGE | INTRAMUSCULAR | Status: DC | PRN

## 2022-12-07 MED ORDER — SUGAMMADEX SODIUM 200 MG/2ML IV SOLN: INTRAVENOUS | Status: DC | PRN

## 2022-12-07 MED ORDER — ACETAMINOPHEN 500 MG PO TABS
1000.0000 mg | ORAL_TABLET | Freq: Once | ORAL | Status: AC
  Administered 2022-12-07: 1000 mg via ORAL
  Filled 2022-12-07: qty 2

## 2022-12-07 MED ORDER — EPHEDRINE SULFATE-NACL 50-0.9 MG/10ML-% IV SOSY
PREFILLED_SYRINGE | INTRAVENOUS | Status: DC | PRN
  Administered 2022-12-07: 5 mg via INTRAVENOUS

## 2022-12-07 MED ORDER — GLYCOPYRROLATE PF 0.2 MG/ML IJ SOSY
PREFILLED_SYRINGE | INTRAMUSCULAR | Status: DC | PRN
  Administered 2022-12-07: .2 mg via INTRAVENOUS

## 2022-12-07 MED ORDER — LISINOPRIL 20 MG PO TABS
20.0000 mg | ORAL_TABLET | Freq: Every day | ORAL | Status: DC
  Administered 2022-12-08: 20 mg via ORAL
  Filled 2022-12-07: qty 1

## 2022-12-07 MED ORDER — ROCURONIUM BROMIDE 10 MG/ML (PF) SYRINGE
PREFILLED_SYRINGE | INTRAVENOUS | Status: DC | PRN
  Administered 2022-12-07: 60 mg via INTRAVENOUS
  Administered 2022-12-07: 10 mg via INTRAVENOUS

## 2022-12-07 MED ORDER — VITAMIN B-12 1000 MCG PO TABS
1000.0000 ug | ORAL_TABLET | Freq: Every day | ORAL | Status: DC
  Administered 2022-12-08 – 2022-12-09 (×2): 1000 ug via ORAL
  Filled 2022-12-07 (×2): qty 1

## 2022-12-07 MED ORDER — SODIUM BICARBONATE 650 MG PO TABS
650.0000 mg | ORAL_TABLET | Freq: Two times a day (BID) | ORAL | Status: DC
  Administered 2022-12-07 – 2022-12-09 (×4): 650 mg via ORAL
  Filled 2022-12-07 (×4): qty 1

## 2022-12-07 MED ORDER — DEXAMETHASONE SODIUM PHOSPHATE 10 MG/ML IJ SOLN
INTRAMUSCULAR | Status: DC | PRN
  Administered 2022-12-07: 8 mg via INTRAVENOUS

## 2022-12-07 MED ORDER — FERROUS SULFATE 325 (65 FE) MG PO TABS
325.0000 mg | ORAL_TABLET | Freq: Every day | ORAL | Status: DC
  Administered 2022-12-08 – 2022-12-09 (×2): 325 mg via ORAL
  Filled 2022-12-07 (×2): qty 1

## 2022-12-07 MED ORDER — LACTATED RINGERS IV SOLN: INTRAVENOUS | Status: DC

## 2022-12-07 MED ORDER — CHLORHEXIDINE GLUCONATE 0.12 % MT SOLN
15.0000 mL | Freq: Once | OROMUCOSAL | Status: AC
  Administered 2022-12-07: 15 mL via OROMUCOSAL

## 2022-12-07 MED ORDER — FENTANYL CITRATE (PF) 100 MCG/2ML IJ SOLN
INTRAMUSCULAR | Status: DC | PRN
  Administered 2022-12-07: 50 ug via INTRAVENOUS

## 2022-12-07 MED ORDER — GABAPENTIN 300 MG PO CAPS
300.0000 mg | ORAL_CAPSULE | Freq: Once | ORAL | Status: DC
  Filled 2022-12-07: qty 1

## 2022-12-07 MED ORDER — FAMOTIDINE-CA CARB-MAG HYDROX 10-800-165 MG PO CHEW: 1.0000 | CHEWABLE_TABLET | Freq: Every day | ORAL | Status: DC | PRN

## 2022-12-07 MED ORDER — PHENYLEPHRINE HCL-NACL 20-0.9 MG/250ML-% IV SOLN
INTRAVENOUS | Status: DC | PRN
  Administered 2022-12-07: 30 ug/min via INTRAVENOUS

## 2022-12-07 MED ORDER — FAMOTIDINE 20 MG PO TABS: 10.0000 mg | ORAL_TABLET | Freq: Every day | ORAL | Status: DC | PRN

## 2022-12-07 MED ORDER — ORAL CARE MOUTH RINSE: 15.0000 mL | Freq: Once | OROMUCOSAL | Status: AC

## 2022-12-07 MED ORDER — PHENYLEPHRINE HCL (PRESSORS) 10 MG/ML IV SOLN
INTRAVENOUS | Status: DC | PRN
  Administered 2022-12-07: 80 ug via INTRAVENOUS

## 2022-12-07 MED ORDER — DOCUSATE SODIUM 100 MG PO CAPS
100.0000 mg | ORAL_CAPSULE | Freq: Two times a day (BID) | ORAL | Status: DC
  Administered 2022-12-07 – 2022-12-08 (×3): 100 mg via ORAL
  Filled 2022-12-07 (×4): qty 1

## 2022-12-07 MED ORDER — VITAMIN D 25 MCG (1000 UNIT) PO TABS
1000.0000 [IU] | ORAL_TABLET | Freq: Every day | ORAL | Status: DC
  Administered 2022-12-08 – 2022-12-09 (×2): 1000 [IU] via ORAL
  Filled 2022-12-07 (×3): qty 1

## 2022-12-07 MED ORDER — ONDANSETRON HCL 4 MG/2ML IJ SOLN
INTRAMUSCULAR | Status: DC | PRN
  Administered 2022-12-07: 4 mg via INTRAVENOUS

## 2022-12-07 MED ORDER — HYDROCODONE-ACETAMINOPHEN 5-325 MG PO TABS
1.0000 | ORAL_TABLET | ORAL | Status: DC | PRN
  Administered 2022-12-08: 2 via ORAL
  Filled 2022-12-07 (×2): qty 2

## 2022-12-07 MED ORDER — SUGAMMADEX SODIUM 200 MG/2ML IV SOLN
INTRAVENOUS | Status: DC | PRN
  Administered 2022-12-07: 200 mg via INTRAVENOUS

## 2022-12-07 MED ORDER — ONDANSETRON HCL 4 MG/2ML IJ SOLN: 4.0000 mg | Freq: Four times a day (QID) | INTRAMUSCULAR | Status: DC | PRN

## 2022-12-07 MED ORDER — CEFAZOLIN SODIUM-DEXTROSE 2-4 GM/100ML-% IV SOLN
INTRAVENOUS | Status: AC
  Filled 2022-12-07: qty 100

## 2022-12-07 MED ORDER — AMISULPRIDE (ANTIEMETIC) 5 MG/2ML IV SOLN: 10.0000 mg | Freq: Once | INTRAVENOUS | Status: DC | PRN

## 2022-12-07 MED ORDER — PROPOFOL 10 MG/ML IV BOLUS
INTRAVENOUS | Status: DC | PRN
  Administered 2022-12-07: 120 mg via INTRAVENOUS

## 2022-12-07 MED ORDER — PHENYLEPHRINE HCL-NACL 20-0.9 MG/250ML-% IV SOLN
INTRAVENOUS | Status: AC
  Filled 2022-12-07: qty 250

## 2022-12-07 MED ORDER — AMLODIPINE BESYLATE 5 MG PO TABS
2.5000 mg | ORAL_TABLET | Freq: Two times a day (BID) | ORAL | Status: DC
  Administered 2022-12-07 – 2022-12-09 (×4): 2.5 mg via ORAL
  Filled 2022-12-07 (×4): qty 1

## 2022-12-07 MED ORDER — FENTANYL CITRATE PF 50 MCG/ML IJ SOSY
PREFILLED_SYRINGE | INTRAMUSCULAR | Status: AC
  Filled 2022-12-07: qty 1

## 2022-12-07 NOTE — Plan of Care (Signed)

## 2022-12-07 NOTE — Anesthesia Preprocedure Evaluation (Addendum)
Anesthesia Evaluation  Patient identified by MRN, date of birth, ID band Patient awake    Reviewed: Allergy & Precautions, NPO status , Patient's Chart, lab work & pertinent test results  Airway Mallampati: II  TM Distance: >3 FB Neck ROM: Full    Dental  (+) Edentulous Upper, Partial Lower, Dental Advisory Given, Poor Dentition   Pulmonary former smoker   Pulmonary exam normal breath sounds clear to auscultation       Cardiovascular hypertension, Pt. on medications Normal cardiovascular exam Rhythm:Regular Rate:Normal     Neuro/Psych negative neurological ROS     GI/Hepatic Neg liver ROS,GERD  ,,  Endo/Other  negative endocrine ROS    Renal/GU Renal InsufficiencyRenal disease     Musculoskeletal  (+) Arthritis ,    Abdominal   Peds  Hematology  (+) Blood dyscrasia, anemia   Anesthesia Other Findings   Reproductive/Obstetrics                             Anesthesia Physical Anesthesia Plan  ASA: 3  Anesthesia Plan: General   Post-op Pain Management: Tylenol PO (pre-op)* and Gabapentin PO (pre-op)*   Induction: Intravenous  PONV Risk Score and Plan: 2 and Ondansetron, Dexamethasone and Treatment may vary due to age or medical condition  Airway Management Planned: Oral ETT  Additional Equipment:   Intra-op Plan:   Post-operative Plan: Extubation in OR  Informed Consent: I have reviewed the patients History and Physical, chart, labs and discussed the procedure including the risks, benefits and alternatives for the proposed anesthesia with the patient or authorized representative who has indicated his/her understanding and acceptance.     Dental advisory given  Plan Discussed with: CRNA  Anesthesia Plan Comments:         Anesthesia Quick Evaluation

## 2022-12-07 NOTE — Progress Notes (Signed)
Interventional Radiology Brief Note:  Patient recovering well on unit.  Still somewhat groggy from anesthesia, however wakes up and engages in conversation without issue.  Clear, yellow urine in foley bag.  Denies nausea or pain.   Monitor overnight. BMP ordered for tomorrow AM per [facility].   Plan for discharge home tomorrow with outpatient follow-up with Dr. Lowella Dandy in 2-3.  Plan to obtain Renal US at time of follow-up.   Loyce Dys, MS RD PA-C 4:33 PM

## 2022-12-07 NOTE — H&P (Signed)
Chief Complaint: Patient was seen in consultation today for right renal mass  Referring Physician(s): Dr. Heloise Purpura  Supervising Physician: Richarda Overlie  Patient Status: [facility] - Out-pt  History of Present Illness: Thomas Guerrero is a 77 y.o. male with past medical history of anemia, CKD stage 4, GERD, HTN, MGUS who was incidentially found to have a right renal lesion during ED work-up for abdominal pain and nausea earlier this year.  He met with Urology, Dr. Laverle Patter who referred him to IR for discussion of ablation.  He subsequently met with Dr. Lowella Dandy in consultation 10/12/22 and elected to proceed with ablation procedure.   Thomas Guerrero presents to [facility] Radiology today in his usual state of health. He has been NPO.  He does not take blood thinners. He is understanding of the risks of the procedure and is agreeable to proceed. His wife is available for post-procedure transportation and care.  He understands he will be admitted for observation overnight.   Past Medical History:  Diagnosis Date   Anemia    Arthritis    gout   Chronic kidney disease    GERD (gastroesophageal reflux disease)    High cholesterol    Hx of gout    Hypertension    MGUS (monoclonal gammopathy of unknown significance)    Prostate cancer [facility])     Past Surgical History:  Procedure Laterality Date   Biopsy of the Prostate     COLONOSCOPY  10/17/2016   EYE SURGERY Left 2023   scraped "film" off his eye   MOHS SURGERY  04/2019   forehead   TONSILLECTOMY      Allergies: Patient has no known allergies.  Medications: Prior to Admission medications   Medication Sig Start Date End Date Taking? Authorizing Provider  acetaminophen (TYLENOL) 500 MG tablet Take 500 mg by mouth 2 (two) times daily as needed for moderate pain.   Yes [provider]  allopurinol (ZYLOPRIM) 100 MG tablet Take 100 mg by mouth daily. 07/24/19  Yes [provider]  amLODipine (NORVASC) 2.5 MG tablet Take 2.5 mg  by mouth 2 (two) times daily. 05/27/21  Yes [provider]  atorvastatin (LIPITOR) 10 MG tablet Take 10 mg by mouth daily. 09/19/16  Yes [provider]  cholecalciferol (VITAMIN D) 1000 units tablet Take 1,000 Units by mouth daily.   Yes [provider]  cyanocobalamin (VITAMIN B12) 1000 MCG tablet Take 1,000 mcg by mouth daily.   Yes [provider]  famotidine-calcium carbonate-magnesium hydroxide (PEPCID COMPLETE) 10-800-165 MG chewable tablet Chew 1 tablet by mouth daily as needed (acid reflux).   Yes [provider]  ferrous sulfate 325 (65 FE) MG tablet Take 325 mg by mouth daily with breakfast.   Yes [provider]  gabapentin (NEURONTIN) 300 MG capsule Take 1 capsule (300 mg total) by mouth 3 (three) times daily. Patient taking differently: Take 300 mg by mouth 2 (two) times daily. 07/05/21  Yes Lomax, Amy, NP  lisinopril (ZESTRIL) 20 MG tablet Take 20 mg by mouth daily. 06/09/21  Yes [provider]  sodium bicarbonate 650 MG tablet Take 1 tablet (650 mg total) by mouth 2 (two) times daily. 08/18/22  Yes Burnadette Pop, MD  triamcinolone cream (KENALOG) 0.1 % Apply 1 application  topically 2 (two) times daily as needed (rash). 04/18/16  Yes [provider]     Family History  Problem Relation Age of Onset   Cancer Son  leukemia   Neuropathy Neg Hx     Social History   Socioeconomic History   Marital status: Married    Spouse name: Not on file   Number of children: 2   Years of education: Some college   Highest education level: Not on file  Occupational History   Occupation: Retired  Tobacco Use   Smoking status: Former    Years: 25    Types: Cigarettes    Start date: 08/08/1996   Smokeless tobacco: Never  Vaping Use   Vaping Use: Never used  Substance and Sexual Activity   Alcohol use: Not Currently    Alcohol/week: 0.0 standard drinks of alcohol   Drug use: No   Sexual activity: Not  Currently  Other Topics Concern   Not on file  Social History Narrative   Lives at home w/ his wife and daughter   Right-handed   Caffeine: 1 soda per day   Social Determinants of Health   Financial Resource Strain: Not on file  Food Insecurity: No Food Insecurity (08/14/2022)   Hunger Vital Sign    Worried About Running Out of Food in the Last Year: Never true    Ran Out of Food in the Last Year: Never true  Transportation Needs: No Transportation Needs (08/14/2022)   PRAPARE - Administrator, Civil Service (Medical): No    Lack of Transportation (Non-Medical): No  Physical Activity: Not on file  Stress: Not on file  Social Connections: Not on file     Review of Systems: A 12 point ROS discussed and pertinent positives are indicated in the HPI above.  All other systems are negative.  Review of Systems  Constitutional:  Negative for fatigue and fever.  Respiratory:  Negative for cough and shortness of breath.   Cardiovascular:  Negative for chest pain.  Gastrointestinal:  Negative for abdominal pain, nausea and vomiting.  Genitourinary:  Negative for dysuria and hematuria.  Musculoskeletal:  Negative for back pain.  Psychiatric/Behavioral:  Negative for behavioral problems and confusion.     Vital Signs: Ht 5\' 8"  (1.727 m)   Wt 197 lb (89.4 kg)   BMI 29.95 kg/m   Physical Exam Vitals and nursing note reviewed.  Constitutional:      General: He is not in acute distress.    Appearance: Normal appearance. He is not ill-appearing.  HENT:     Mouth/Throat:     Mouth: Mucous membranes are moist.     Pharynx: Oropharynx is clear.  Cardiovascular:     Rate and Rhythm: Normal rate and regular rhythm.  Pulmonary:     Effort: Pulmonary effort is normal.     Breath sounds: Normal breath sounds.  Abdominal:     General: Abdomen is flat.     Palpations: Abdomen is soft.  Skin:    General: Skin is warm and dry.  Neurological:     General: No focal deficit  present.     Mental Status: He is alert and oriented to person, place, and time. Mental status is at baseline.  Psychiatric:        Mood and Affect: Mood normal.        Behavior: Behavior normal.        Thought Content: Thought content normal.        Judgment: Judgment normal.      MD Evaluation Airway: WNL Heart: WNL Abdomen: WNL Chest/ Lungs: WNL ASA  Classification: 3 Mallampati/Airway Score: Two   Imaging: DG Chest  Port 1 View  Result Date: 11/30/2022 CLINICAL DATA:  Preop exam for kidney surgery. EXAM: PORTABLE CHEST 1 VIEW COMPARISON:  Chest radiograph 08/13/2022, chest CT 10/26/2022 FINDINGS: Heart is normal in size.The cardiomediastinal contours are normal. The lungs are clear. Pulmonary vasculature is normal. No consolidation, pleural effusion, or pneumothorax. No acute osseous abnormalities are seen. IMPRESSION: No acute chest findings. Electronically Signed   By: Narda Rutherford M.D.   On: 11/30/2022 17:16    Labs:  CBC: Recent Labs    08/16/22 0550 08/18/22 0626 11/29/22 1428 12/07/22 1030  WBC 6.8 5.1 6.2 6.4  HGB 9.2* 8.5* 10.9* 10.4*  HCT 27.8* 26.0* 33.6* 31.8*  PLT 141* 125* 165 155    COAGS: No results for input(s): "INR", "APTT" in the last 8760 hours.  BMP: Recent Labs    08/15/22 0600 08/16/22 0550 08/18/22 0626 11/29/22 1427  NA 133* 134* 139 139  K 4.1 4.2 3.8 4.7  CL 106 106 113* 108  CO2 18* 19* 20* 21*  GLUCOSE 88 76 103* 115*  BUN 38* 31* 29* 34*  CALCIUM 8.9 9.0 8.5* 9.6  CREATININE 1.77* 1.90* 2.23* 2.16*  GFRNONAA 39* 36* 30* 31*    LIVER FUNCTION TESTS: Recent Labs    08/13/22 2109 08/14/22 0557 08/15/22 0600 08/16/22 0550  BILITOT 1.3* 0.8 0.9 1.2  AST 20 13* 13* 14*  ALT 13 11 10 10   ALKPHOS 64 52 48 46  PROT 8.1 6.1* 5.6* 5.1*  ALBUMIN 4.2 3.0* 2.9* 2.6*    TUMOR MARKERS: No results for input(s): "AFPTM", "CEA", "CA199", "CHROMGRNA" in the last 8760 hours.  Assessment and Plan: Patient with past medical  history of CKD stage 4, HTN, prostate cancer presents with complaint of right renal mass.  IR consulted for renal cryoablation at the request of Dr. Laverle Patter. Case reviewed by Dr. Lowella Dandy who has met with the patient in consultation and approves patient for procedure.  Patient presents today in their usual state of health.  He has been NPO and is not currently on blood thinners.  His renal function is stable with baseline Cr around 2.2, GFR 31 4/23.    Risks and benefits of image guided renal cryoablation was discussed with the patient including, but not limited to, failure to treat entire lesion, bleeding, infection, damage to adjacent structures, hematuria, urine leak, decrease in renal function or post procedural neuropathy.  All of the patient's questions were answered and the patient is agreeable to proceed. Consent signed and in chart.   Thank you for this interesting consult.  I greatly enjoyed meeting Thomas Guerrero and look forward to participating in their care.  A copy of this report was sent to the requesting provider on this date.  Electronically Signed: Hoyt Koch, PA 12/07/2022, 11:02 AM   I spent a total of  30 Minutes   in face to face in clinical consultation, greater than 50% of which was counseling/coordinating care for right renal mass.

## 2022-12-07 NOTE — Transfer of Care (Signed)
Immediate Anesthesia Transfer of Care Note  Patient: Thomas Guerrero  Procedure(s) Performed: CT RENAL CRYOABLATION (Right)  Patient Location: PACU  Anesthesia Type:General  Level of Consciousness: awake, alert , oriented, and patient cooperative  Airway & Oxygen Therapy: Patient Spontanous Breathing and Patient connected to face mask oxygen  Post-op Assessment: Report given to RN and Post -op Vital signs reviewed and stable  Post vital signs: Reviewed and stable  Last Vitals:  Vitals Value Taken Time  BP 157/60 12/07/22 1430  Temp    Pulse 62 12/07/22 1431  Resp 15 12/07/22 1431  SpO2 100 % 12/07/22 1431  Vitals shown include unvalidated device data.  Last Pain: There were no vitals filed for this visit.       Complications: No notable events documented.

## 2022-12-07 NOTE — Anesthesia Procedure Notes (Signed)
Procedure Name: Intubation Date/Time: 12/07/2022 11:40 AM  Performed by: Ponciano Ort, CRNAPre-anesthesia Checklist: Patient identified, Emergency Drugs available, Suction available and Patient being monitored Patient Re-evaluated:Patient Re-evaluated prior to induction Oxygen Delivery Method: Circle system utilized Preoxygenation: Pre-oxygenation with 100% oxygen Induction Type: IV induction Ventilation: Mask ventilation without difficulty and Oral airway inserted - appropriate to patient size Laryngoscope Size: Mac and 3 Grade View: Grade I Tube type: Oral Tube size: 7.5 mm Number of attempts: 1 Airway Equipment and Method: Stylet and Oral airway Placement Confirmation: ETT inserted through vocal cords under direct vision, positive ETCO2 and breath sounds checked- equal and bilateral Secured at: 21 cm Tube secured with: Tape Dental Injury: Teeth and Oropharynx as per pre-operative assessment

## 2022-12-07 NOTE — Procedures (Signed)
Interventional Radiology Procedure:   Indications: Suspicious right renal lesion, presumed renal cell carcinoma  Procedure: Imaged guided cryoablation of right renal lesion  Findings: Cryoablation of right kidney lower pole exophytic lesion using 2 IcePearl probes.   10 and 8 minutes freeze times.  No immediate complication.    Complications: None     EBL: Minimal  Plan: Keep for overnight observation.  Check labs in am.    Rachelle Hora. Lowella Dandy, MD  Pager: [phone]

## 2022-12-08 ENCOUNTER — Encounter (HOSPITAL_COMMUNITY): Payer: Self-pay | Admitting: Diagnostic Radiology

## 2022-12-08 DIAGNOSIS — N2889 Other specified disorders of kidney and ureter: Secondary | ICD-10-CM | POA: Diagnosis not present

## 2022-12-08 DIAGNOSIS — R739 Hyperglycemia, unspecified: Secondary | ICD-10-CM | POA: Diagnosis not present

## 2022-12-08 DIAGNOSIS — K219 Gastro-esophageal reflux disease without esophagitis: Secondary | ICD-10-CM | POA: Diagnosis not present

## 2022-12-08 DIAGNOSIS — N184 Chronic kidney disease, stage 4 (severe): Secondary | ICD-10-CM | POA: Diagnosis not present

## 2022-12-08 DIAGNOSIS — I129 Hypertensive chronic kidney disease with stage 1 through stage 4 chronic kidney disease, or unspecified chronic kidney disease: Secondary | ICD-10-CM | POA: Diagnosis not present

## 2022-12-08 DIAGNOSIS — D649 Anemia, unspecified: Secondary | ICD-10-CM | POA: Diagnosis not present

## 2022-12-08 DIAGNOSIS — Z87891 Personal history of nicotine dependence: Secondary | ICD-10-CM | POA: Diagnosis not present

## 2022-12-08 LAB — URINALYSIS, ROUTINE W REFLEX MICROSCOPIC
Bacteria, UA: NONE SEEN
Bilirubin Urine: NEGATIVE
Glucose, UA: NEGATIVE mg/dL
Ketones, ur: NEGATIVE mg/dL
Leukocytes,Ua: NEGATIVE
Nitrite: NEGATIVE
Protein, ur: 30 mg/dL — AB
Specific Gravity, Urine: 1.008 (ref 1.005–1.030)
pH: 6 (ref 5.0–8.0)

## 2022-12-08 LAB — BASIC METABOLIC PANEL
Anion gap: 10 (ref 5–15)
Anion gap: 11 (ref 5–15)
Anion gap: 11 (ref 5–15)
Anion gap: 11 (ref 5–15)
BUN: 33 mg/dL — ABNORMAL HIGH (ref 8–23)
BUN: 38 mg/dL — ABNORMAL HIGH (ref 8–23)
BUN: 41 mg/dL — ABNORMAL HIGH (ref 8–23)
BUN: 44 mg/dL — ABNORMAL HIGH (ref 8–23)
CO2: 17 mmol/L — ABNORMAL LOW (ref 22–32)
CO2: 18 mmol/L — ABNORMAL LOW (ref 22–32)
CO2: 18 mmol/L — ABNORMAL LOW (ref 22–32)
CO2: 20 mmol/L — ABNORMAL LOW (ref 22–32)
Calcium: 9.5 mg/dL (ref 8.9–10.3)
Calcium: 9.2 mg/dL (ref 8.9–10.3)
Calcium: 9.1 mg/dL (ref 8.9–10.3)
Calcium: 9.1 mg/dL (ref 8.9–10.3)
Chloride: 110 mmol/L (ref 98–111)
Chloride: 107 mmol/L (ref 98–111)
Chloride: 106 mmol/L (ref 98–111)
Chloride: 104 mmol/L (ref 98–111)
Creatinine, Ser: 2.11 mg/dL — ABNORMAL HIGH (ref 0.61–1.24)
Creatinine, Ser: 2.32 mg/dL — ABNORMAL HIGH (ref 0.61–1.24)
Creatinine, Ser: 2.31 mg/dL — ABNORMAL HIGH (ref 0.61–1.24)
Creatinine, Ser: 2.45 mg/dL — ABNORMAL HIGH (ref 0.61–1.24)
GFR, Estimated: 32 mL/min — ABNORMAL LOW (ref >60)
GFR, Estimated: 28 mL/min — ABNORMAL LOW (ref >60)
GFR, Estimated: 28 mL/min — ABNORMAL LOW (ref >60)
GFR, Estimated: 26 mL/min — ABNORMAL LOW (ref >60)
Glucose, Bld: 144 mg/dL — ABNORMAL HIGH (ref 70–99)
Glucose, Bld: 162 mg/dL — ABNORMAL HIGH (ref 70–99)
Glucose, Bld: 132 mg/dL — ABNORMAL HIGH (ref 70–99)
Glucose, Bld: 129 mg/dL — ABNORMAL HIGH (ref 70–99)
Potassium: 5.7 mmol/L — ABNORMAL HIGH (ref 3.5–5.1)
Potassium: 5.6 mmol/L — ABNORMAL HIGH (ref 3.5–5.1)
Potassium: 6 mmol/L — ABNORMAL HIGH (ref 3.5–5.1)
Potassium: 5.1 mmol/L (ref 3.5–5.1)
Sodium: 137 mmol/L (ref 135–145)
Sodium: 136 mmol/L (ref 135–145)
Sodium: 135 mmol/L (ref 135–145)
Sodium: 135 mmol/L (ref 135–145)

## 2022-12-08 LAB — CBC
HCT: 32.5 % — ABNORMAL LOW (ref 39.0–52.0)
Hemoglobin: 10.6 g/dL — ABNORMAL LOW (ref 13.0–17.0)
MCH: 34.8 pg — ABNORMAL HIGH (ref 26.0–34.0)
MCHC: 32.6 g/dL (ref 30.0–36.0)
MCV: 106.6 fL — ABNORMAL HIGH (ref 80.0–100.0)
Platelets: 146 10*3/uL — ABNORMAL LOW (ref 150–400)
RBC: 3.05 MIL/uL — ABNORMAL LOW (ref 4.22–5.81)
RDW: 12.3 % (ref 11.5–15.5)
WBC: 13.9 10*3/uL — ABNORMAL HIGH (ref 4.0–10.5)
nRBC: 0 % (ref 0.0–0.2)

## 2022-12-08 LAB — HEMOGLOBIN A1C
Hgb A1c MFr Bld: 5.3 % (ref 4.8–5.6)
Mean Plasma Glucose: 105.41 mg/dL

## 2022-12-08 MED ORDER — INSULIN ASPART 100 UNIT/ML IJ SOLN: 0.0000 [IU] | Freq: Every day | INTRAMUSCULAR | Status: DC

## 2022-12-08 MED ORDER — SODIUM ZIRCONIUM CYCLOSILICATE 10 G PO PACK
10.0000 g | PACK | Freq: Once | ORAL | Status: AC
  Administered 2022-12-08: 10 g via ORAL
  Filled 2022-12-08: qty 1

## 2022-12-08 MED ORDER — INSULIN ASPART 100 UNIT/ML IJ SOLN
0.0000 [IU] | Freq: Three times a day (TID) | INTRAMUSCULAR | Status: DC
  Administered 2022-12-08: 1 [IU] via SUBCUTANEOUS

## 2022-12-08 MED ORDER — SODIUM CHLORIDE 0.9 % IV SOLN: INTRAVENOUS | Status: AC

## 2022-12-08 MED ORDER — SODIUM ZIRCONIUM CYCLOSILICATE 5 G PO PACK
5.0000 g | PACK | Freq: Once | ORAL | Status: AC
  Administered 2022-12-08: 5 g via ORAL
  Filled 2022-12-08: qty 1

## 2022-12-08 NOTE — Progress Notes (Signed)
Referring Physician(s): Dr. Heloise Purpura  Supervising Physician: Richarda Overlie  Patient Status:  [facility] - In-pt  Chief Complaint: Renal cryoablation  Subjective: Patient resting comfortably this AM.  Foley was removed around 0630, has not voided since.  Reports clear, yellow urine overnight.  Denies pain.  Eating and drinking with tolerance.   Allergies: Patient has no known allergies.  Medications: Prior to Admission medications   Medication Sig Start Date End Date Taking? Authorizing Provider  acetaminophen (TYLENOL) 500 MG tablet Take 500 mg by mouth 2 (two) times daily as needed for moderate pain.   Yes [provider]  allopurinol (ZYLOPRIM) 100 MG tablet Take 100 mg by mouth daily. 07/24/19  Yes [provider]  amLODipine (NORVASC) 2.5 MG tablet Take 2.5 mg by mouth 2 (two) times daily. 05/27/21  Yes [provider]  atorvastatin (LIPITOR) 10 MG tablet Take 10 mg by mouth daily. 09/19/16  Yes [provider]  cholecalciferol (VITAMIN D) 1000 units tablet Take 1,000 Units by mouth daily.   Yes [provider]  cyanocobalamin (VITAMIN B12) 1000 MCG tablet Take 1,000 mcg by mouth daily.   Yes [provider]  famotidine-calcium carbonate-magnesium hydroxide (PEPCID COMPLETE) 10-800-165 MG chewable tablet Chew 1 tablet by mouth daily as needed (acid reflux).   Yes [provider]  ferrous sulfate 325 (65 FE) MG tablet Take 325 mg by mouth daily with breakfast.   Yes [provider]  gabapentin (NEURONTIN) 300 MG capsule Take 1 capsule (300 mg total) by mouth 3 (three) times daily. Patient taking differently: Take 300 mg by mouth 2 (two) times daily. 07/05/21  Yes Lomax, Amy, NP  lisinopril (ZESTRIL) 20 MG tablet Take 20 mg by mouth daily. 06/09/21  Yes [provider]  sodium bicarbonate 650 MG tablet Take 1 tablet (650 mg total) by mouth 2 (two) times daily. 08/18/22  Yes Burnadette Pop, MD   triamcinolone cream (KENALOG) 0.1 % Apply 1 application  topically 2 (two) times daily as needed (rash). 04/18/16  Yes [provider]     Vital Signs: BP (!) 122/51 (BP Location: Right Arm)   Pulse 68   Temp 97.6 F (36.4 C) (Oral)   Resp 17   Ht 5\' 8"  (1.727 m)   Wt 197 lb 0.1 oz (89.4 kg)   SpO2 96%   BMI 29.95 kg/m   Physical Exam NAD, alert, sitting up after breakfast Back/flank: procedure site intact.  No oozing or erythema. Dressing clean and dry.   Imaging: CT GUIDE TISSUE ABLATION  Result Date: 12/07/2022 INDICATION: 77 year old with a suspicious enhancing right renal lesion. This is highly concerning for a renal cell carcinoma. Plan for image guided cryoablation of the right renal lesion. EXAM: IMAGE GUIDED CRYOABLATION OF RIGHT RENAL LESION USING ULTRASOUND AND CT GUIDANCE COMPARISON:  MRI 09/30/2022 MEDICATIONS: Ancef 2 g ANESTHESIA/SEDATION: General - as administered by the Anesthesia department FLUOROSCOPY: None COMPLICATIONS: None immediate. TECHNIQUE: Informed written consent was obtained from the patient after a thorough discussion of the procedural risks, benefits and alternatives. All questions were addressed. Maximal Sterile Barrier Technique was utilized including caps, mask, sterile gowns, sterile gloves, sterile drape, hand hygiene and skin antiseptic. A timeout was performed prior to the initiation of the procedure. Patient was placed under general anesthesia. Patient was placed prone. Right renal lesion was identified using CT and ultrasound guidance. The right flank was prepped with chlorhexidine and sterile field was created. Skin incision was made. Using ultrasound guidance, a 14  gauge IcePearl cryoablation needle was advanced into the inferior aspect of the right renal lesion. Placement was confirmed with CT. Second incision was made. A second IcePearl needle was placed into the superior aspect of the right renal lesion using ultrasound guidance. Both  needles were slightly adjusted using CT guidance. Both needles were placed just beyond the anterior margins of the renal lesion. Attention was made to the location of the right ureter. A 22 gauge spinal needle was initially directed towards the right kidney in order to perform hydro dissection. Elected not to use hydrodissection because concerned that the right ureter would be difficult to visualize after hydro dissection and concerned that the hydrodissection may actually move the ureter closer to the lesion. Due to the positioning of the cryoablation needles and the small size of the lesion, a renal biopsy was not attempted. The cryoablation was performed with an initial 10 minute freeze followed by an active thaw and a second freeze for 8 minutes. Periodic CT imaging was performed during the freezing to evaluate the ice ball growth. After the final thaw, the superior needle was slightly pulled back prior to using the cautery function on the needles. Cautery was performed with both needles. Both needles were removed. Follow up CT images were obtained. FINDINGS: Exophytic lesion along the medial inferior aspect of the right kidney measuring approximately 2.4 cm. Right ureter was in close proximity to the superior aspect of the lesion. Two cryoablation needles were placed within the lesion. Prior to ablation, the ureter was at least 1 cm from the tip of the cryoablation needles. Ureter appeared to be just peripheral to the ice ball during the freezing cycles. No gross abnormality to the right ureter at the end of the procedure. No significant perinephric bleeding following the procedure. IMPRESSION: Image guided cryoablation of the suspicious right renal lesion. Electronically Signed   By: Richarda Overlie M.D.   On: 12/07/2022 16:42    Labs:  CBC: Recent Labs    08/18/22 0626 11/29/22 1428 12/07/22 1030 12/08/22 0504  WBC 5.1 6.2 6.4 13.9*  HGB 8.5* 10.9* 10.4* 10.6*  HCT 26.0* 33.6* 31.8* 32.5*  PLT 125*  165 155 146*    COAGS: Recent Labs    12/07/22 1030  INR 1.0    BMP: Recent Labs    11/29/22 1427 12/08/22 0504 12/08/22 1037 12/08/22 1349  NA 139 137 136 135  K 4.7 5.7* 5.6* 6.0*  CL 108 110 107 106  CO2 21* 17* 18* 18*  GLUCOSE 115* 144* 162* 132*  BUN 34* 33* 38* 41*  CALCIUM 9.6 9.5 9.2 9.1  CREATININE 2.16* 2.11* 2.32* 2.31*  GFRNONAA 31* 32* 28* 28*    LIVER FUNCTION TESTS: Recent Labs    08/13/22 2109 08/14/22 0557 08/15/22 0600 08/16/22 0550  BILITOT 1.3* 0.8 0.9 1.2  AST 20 13* 13* 14*  ALT 13 11 10 10   ALKPHOS 64 52 48 46  PROT 8.1 6.1* 5.6* 5.1*  ALBUMIN 4.2 3.0* 2.9* 2.6*    Assessment and Plan: Right renal mass cryoablation 12/07/22 by Dr. Lowella Dandy Patient doing well this AM. He is eating and drinking with tolerance. Denies pain. Did take 1 dose of Norco this AM for soreness as well as his daily dose of Neurotin.  Foley was removed without issue.  Reports clear, yellow urine prior to removal.  Working towards d/c home today after evaluation of labwork.  Wife is available for transportation home as needed.   Electronically Signed: Hoyt Koch,  PA 12/08/2022, 4:14 PM   I spent a total of 25 Minutes at the the patient's bedside AND on the patient's hospital floor or unit, greater than 50% of which was counseling/coordinating care for right renal mass cryoablation, hyperkalemia

## 2022-12-08 NOTE — Consult Note (Signed)
[facility] Medical Consultation  Nikolaos Maddocks ZOX:[id number] DOB: 07-Feb-1946 DOA: 12/07/2022 PCP: Irven Coe, MD   Requesting physician: Lowella Dandy Date of consultation: 5/2 Reason for consultation: hyperkalemia  Impression/Recommendations Principal Problem:   Right renal mass Active Problems:   Renal lesion  Hyperkalemia: Unclear what potassium was on 5/1, but looking back, most recent K was 4.7 (on higher side of normal) on 11/29/22.  He's taking lisinopril which he did receive this morning, which likely contributed to hyperkalemia in setting of his CKD and developing AKI.  K peaked at 6.  EKG without concerning findings.  S/p 5 mg lokelma earlier today.  Given additional 10 mg this afternoon.  Repeat K is 5.1.  Will discontinue lisinopril.  Give gentle MIVF overnight with rising creatinine.  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  Will trial IVF for now.  His baseline creatinine appears to be closer to 2-2.1.  2.45 at this time.  Follow UA.  If worsening or not improving, will follow renal imaging.  Continue bicarb. 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  Hold lisinopril.  Gout: continue allopurinol HLD: continue lipitor Neuropathy: gabapentin GERD: pepcid complete prn Continue supplements, vitamin b12, iron, vitamin d as ordered Hx MGUS follow outpatient Hx Prostate Ca follow outpatient  I will followup again tomorrow. Please contact me if I can be of assistance in the meanwhile. Thank you for this consultation.  Chief Complaint: here for IR procedure  HPI:  77 yo gentleman with hx CKD stage IV, MGUS, prostatic cancer, HTN, HLD and imaging findings concerning for renal cell carcinoma.  He was admitted after Collyn Selk procedure with IR for image guided cryoablation of right renal lesion.  Hospitalists were  consulted due to hyperkalemia which has developed on 5/2 after his procedure.  He notes prior to hospitalization he was in his normal state of heal.  Denies fevers, chills, CP, SOB, abdominal pain, nausea, vomiting.  Notes chronic neuropathy.  Denies smoking or drinking.    He tells me his potassium has always been on high side.  He's seeing Dr. Ronalee Belts from [facility] for his CKD as an outpatient.    Review of Systems:  As per HPI  Past Medical History:  Diagnosis Date   Anemia    Arthritis    gout   Chronic kidney disease    GERD (gastroesophageal reflux disease)    High cholesterol    Hx of gout    Hypertension    MGUS (monoclonal gammopathy of unknown significance)    Prostate cancer [facility])    Past Surgical History:  Procedure Laterality Date   Biopsy of the Prostate     COLONOSCOPY  10/17/2016   EYE SURGERY Left 2023   scraped "film" off his eye   MOHS SURGERY  04/2019   forehead   RADIOLOGY WITH ANESTHESIA Right 12/07/2022   Procedure: CT RENAL CRYOABLATION;  Surgeon: Richarda Overlie, MD;  Location: [facility] ORS;  Service: Anesthesiology;  Laterality: Right;   TONSILLECTOMY     Social History:  reports that he has quit smoking. His smoking use included cigarettes. He started smoking about 26 years ago. He has never used smokeless tobacco. He reports that he does not currently use alcohol. He reports that he does not use drugs.  No Known Allergies Family History  Problem Relation Age of Onset   Cancer  Son        leukemia   Neuropathy Neg Hx     Prior to Admission medications   Medication Sig Start Date End Date Taking? Authorizing Provider  acetaminophen (TYLENOL) 500 MG tablet Take 500 mg by mouth 2 (two) times daily as needed for moderate pain.   Yes [provider]  allopurinol (ZYLOPRIM) 100 MG tablet Take 100 mg by mouth daily. 07/24/19  Yes [provider]  amLODipine (NORVASC) 2.5 MG tablet Take 2.5 mg by mouth 2 (two) times daily. 05/27/21  Yes [provider]  atorvastatin (LIPITOR) 10 MG tablet Take 10 mg by mouth daily. 09/19/16  Yes [provider]  cholecalciferol (VITAMIN D) 1000 units tablet Take 1,000 Units by mouth daily.   Yes [provider]  cyanocobalamin (VITAMIN B12) 1000 MCG tablet Take 1,000 mcg by mouth daily.   Yes [provider]  famotidine-calcium carbonate-magnesium hydroxide (PEPCID COMPLETE) 10-800-165 MG chewable tablet Chew 1 tablet by mouth daily as needed (acid reflux).   Yes [provider]  ferrous sulfate 325 (65 FE) MG tablet Take 325 mg by mouth daily with breakfast.   Yes [provider]  gabapentin (NEURONTIN) 300 MG capsule Take 1 capsule (300 mg total) by mouth 3 (three) times daily. Patient taking differently: Take 300 mg by mouth 2 (two) times daily. 07/05/21  Yes Lomax, Amy, NP  lisinopril (ZESTRIL) 20 MG tablet Take 20 mg by mouth daily. 06/09/21  Yes [provider]  sodium bicarbonate 650 MG tablet Take 1 tablet (650 mg total) by mouth 2 (two) times daily. 08/18/22  Yes Burnadette Pop, MD  triamcinolone cream (KENALOG) 0.1 % Apply 1 application  topically 2 (two) times daily as needed (rash). 04/18/16  Yes [provider]   Physical Exam: Blood pressure (!) 122/51, pulse 68, temperature 97.6 F (36.4 C), temperature source Oral, resp. rate 17, height 5\' 8"  (1.727 m), weight 89.4 kg, SpO2 96 %. Vitals:   12/08/22 0930 12/08/22 1228  BP: (!) 124/51 (!) 122/51  Pulse: 73 68  Resp: 17 17  Temp: 97.9 F (36.6 C) 97.6 F (36.4 C)  SpO2: 96% 96%    General:  NAD, comfortable laying in bed Eyes: EOMI ENT: MMM Neck: supple Cardiovascular: RRR Respiratory: unlabored, CTAB Abdomen: s/nt/nd -- dressing to R flank, no CVA tenderness Skin: no visible rash Musculoskeletal: moving all extremities Psychiatric: normal mood and affect Neurologic: alert and oriented  Labs on Admission:  Basic Metabolic Panel: Recent Labs  Lab  12/08/22 0504 12/08/22 1037 12/08/22 1349  NA 137 136 135  K 5.7* 5.6* 6.0*  CL 110 107 106  CO2 17* 18* 18*  GLUCOSE 144* 162* 132*  BUN 33* 38* 41*  CREATININE 2.11* 2.32* 2.31*  CALCIUM 9.5 9.2 9.1   Liver Function Tests: No results for input(s): "AST", "ALT", "ALKPHOS", "BILITOT", "PROT", "ALBUMIN" in the last 168 hours. No results for input(s): "LIPASE", "AMYLASE" in the last 168 hours. No results for input(s): "AMMONIA" in the last 168 hours. CBC: Recent Labs  Lab 12/07/22 1030 12/08/22 0504  WBC 6.4 13.9*  HGB 10.4* 10.6*  HCT 31.8* 32.5*  MCV 107.4* 106.6*  PLT 155 146*   Cardiac Enzymes: No results for input(s): "CKTOTAL", "CKMB", "CKMBINDEX", "TROPONINI" in the last 168 hours. BNP: Invalid input(s): "POCBNP" CBG: No results for input(s): "GLUCAP" in the last 168 hours.  Radiological Exams on Admission: CT GUIDE TISSUE ABLATION  Result Date: 12/07/2022 INDICATION: 77 year old with Shye Doty suspicious enhancing  right renal lesion. This is highly concerning for Taylor Spilde renal cell carcinoma. Plan for image guided cryoablation of the right renal lesion. EXAM: IMAGE GUIDED CRYOABLATION OF RIGHT RENAL LESION USING ULTRASOUND AND CT GUIDANCE COMPARISON:  MRI 09/30/2022 MEDICATIONS: Ancef 2 g ANESTHESIA/SEDATION: General - as administered by the Anesthesia department FLUOROSCOPY: None COMPLICATIONS: None immediate. TECHNIQUE: Informed written consent was obtained from the patient after Luismiguel Lamere thorough discussion of the procedural risks, benefits and alternatives. All questions were addressed. Maximal Sterile Barrier Technique was utilized including caps, mask, sterile gowns, sterile gloves, sterile drape, hand hygiene and skin antiseptic. Jeilani Grupe timeout was performed prior to the initiation of the procedure. Patient was placed under general anesthesia. Patient was placed prone. Right renal lesion was identified using CT and ultrasound guidance. The right flank was prepped with chlorhexidine and  sterile field was created. Skin incision was made. Using ultrasound guidance, Josip Merolla 14 gauge IcePearl cryoablation needle was advanced into the inferior aspect of the right renal lesion. Placement was confirmed with CT. Second incision was made. Zala Degrasse second IcePearl needle was placed into the superior aspect of the right renal lesion using ultrasound guidance. Both needles were slightly adjusted using CT guidance. Both needles were placed just beyond the anterior margins of the renal lesion. Attention was made to the location of the right ureter. Azell Bill 22 gauge spinal needle was initially directed towards the right kidney in order to perform hydro dissection. Elected not to use hydrodissection because concerned that the right ureter would be difficult to visualize after hydro dissection and concerned that the hydrodissection may actually move the ureter closer to the lesion. Due to the positioning of the cryoablation needles and the small size of the lesion, Sophiah Rolin renal biopsy was not attempted. The cryoablation was performed with an initial 10 minute freeze followed by an active thaw and Zakhia Seres second freeze for 8 minutes. Periodic CT imaging was performed during the freezing to evaluate the ice ball growth. After the final thaw, the superior needle was slightly pulled back prior to using the cautery function on the needles. Cautery was performed with both needles. Both needles were removed. Follow up CT images were obtained. FINDINGS: Exophytic lesion along the medial inferior aspect of the right kidney measuring approximately 2.4 cm. Right ureter was in close proximity to the superior aspect of the lesion. Two cryoablation needles were placed within the lesion. Prior to ablation, the ureter was at least 1 cm from the tip of the cryoablation needles. Ureter appeared to be just peripheral to the ice ball during the freezing cycles. No gross abnormality to the right ureter at the end of the procedure. No significant perinephric  bleeding following the procedure. IMPRESSION: Image guided cryoablation of the suspicious right renal lesion. Electronically Signed   By: Richarda Overlie M.D.   On: 12/07/2022 16:42    EKG: Independently reviewed. NSR, first degree AV block  Time spent: 50 min  Lacretia Nicks [facility]  If 7PM-7AM, please contact night-coverage [email or web address] Password [facility] 12/08/2022, 6:02 PM

## 2022-12-08 NOTE — Progress Notes (Signed)
Interventional Radiology Brief Note:  Patient with potassium of 5.7 this AM. He does have a history of CKD with prior baseline of potassium around 4.  His GFR is stable at 32.  Cr at baseline at 2.11.   Will obtain repeat lab to verify.   Update 1200: Repeat potassium remains elevated at 5.6, Cr up to 2.32. Will give dose of Lokelma 5mg .  Continue to monitor.   Update 1400: Repeat potassium after dose of Lokelma is again elevated at 6.0.  Call placed to hospitalist team for additional intervention and monitoring.  Spoke with Dr. Lowell Guitar who will see patient this afternoon/evening.  Also spoke with patient to inform that he will stay the night given his labwork.   Appreciate TRH assistance.   Loyce Dys, MS RD PA-C

## 2022-12-08 NOTE — Anesthesia Postprocedure Evaluation (Signed)
Anesthesia Post Note  Patient: Thomas Guerrero  Procedure(s) Performed: CT RENAL CRYOABLATION (Right)     Patient location during evaluation: PACU Anesthesia Type: General Level of consciousness: sedated and patient cooperative Pain management: pain level controlled Vital Signs Assessment: post-procedure vital signs reviewed and stable Respiratory status: spontaneous breathing Cardiovascular status: stable Anesthetic complications: no   No notable events documented.  Last Vitals:  Vitals:   12/08/22 0155 12/08/22 0526  BP: 133/69 120/61  Pulse: 88 86  Resp: 18 16  Temp: 36.7 C 36.7 C  SpO2: 98% 97%    Last Pain:  Vitals:   12/08/22 0701  TempSrc:   PainSc: 3                  Lewie Loron

## 2022-12-08 NOTE — Progress Notes (Signed)
  Transition of Care [facility]) Screening Note   Patient Details  Name: Thomas Guerrero Date of Birth: 20-Oct-1945   Transition of Care [facility]) CM/SW Contact:    Adrian Prows, RN Phone Number: 12/08/2022, 9:47 AM    Transition of Care Department [facility]) has reviewed patient and no TOC needs have been identified at this time. We will continue to monitor patient advancement through interdisciplinary progression rounds. If new patient transition needs arise, please place a TOC consult.

## 2022-12-09 ENCOUNTER — Observation Stay (HOSPITAL_COMMUNITY): Payer: Medicare Other

## 2022-12-09 DIAGNOSIS — N184 Chronic kidney disease, stage 4 (severe): Secondary | ICD-10-CM | POA: Diagnosis not present

## 2022-12-09 DIAGNOSIS — K219 Gastro-esophageal reflux disease without esophagitis: Secondary | ICD-10-CM | POA: Diagnosis not present

## 2022-12-09 DIAGNOSIS — I129 Hypertensive chronic kidney disease with stage 1 through stage 4 chronic kidney disease, or unspecified chronic kidney disease: Secondary | ICD-10-CM | POA: Diagnosis not present

## 2022-12-09 DIAGNOSIS — N2889 Other specified disorders of kidney and ureter: Secondary | ICD-10-CM | POA: Diagnosis not present

## 2022-12-09 DIAGNOSIS — Z87891 Personal history of nicotine dependence: Secondary | ICD-10-CM | POA: Diagnosis not present

## 2022-12-09 DIAGNOSIS — D649 Anemia, unspecified: Secondary | ICD-10-CM | POA: Diagnosis not present

## 2022-12-09 DIAGNOSIS — N281 Cyst of kidney, acquired: Secondary | ICD-10-CM | POA: Diagnosis not present

## 2022-12-09 LAB — GLUCOSE, CAPILLARY
Glucose-Capillary: 129 mg/dL — ABNORMAL HIGH (ref 70–99)
Glucose-Capillary: 137 mg/dL — ABNORMAL HIGH (ref 70–99)
Glucose-Capillary: 103 mg/dL — ABNORMAL HIGH (ref 70–99)
Glucose-Capillary: 105 mg/dL — ABNORMAL HIGH (ref 70–99)

## 2022-12-09 LAB — PHOSPHORUS: Phosphorus: 3.6 mg/dL (ref 2.5–4.6)

## 2022-12-09 LAB — BASIC METABOLIC PANEL
Anion gap: 8 (ref 5–15)
BUN: 54 mg/dL — ABNORMAL HIGH (ref 8–23)
CO2: 20 mmol/L — ABNORMAL LOW (ref 22–32)
Calcium: 8.8 mg/dL — ABNORMAL LOW (ref 8.9–10.3)
Chloride: 108 mmol/L (ref 98–111)
Creatinine, Ser: 2.47 mg/dL — ABNORMAL HIGH (ref 0.61–1.24)
GFR, Estimated: 26 mL/min — ABNORMAL LOW (ref >60)
Glucose, Bld: 122 mg/dL — ABNORMAL HIGH (ref 70–99)
Potassium: 5 mmol/L (ref 3.5–5.1)
Sodium: 136 mmol/L (ref 135–145)

## 2022-12-09 LAB — CBC
HCT: 29.5 % — ABNORMAL LOW (ref 39.0–52.0)
Hemoglobin: 9.6 g/dL — ABNORMAL LOW (ref 13.0–17.0)
MCH: 35.3 pg — ABNORMAL HIGH (ref 26.0–34.0)
MCHC: 32.5 g/dL (ref 30.0–36.0)
MCV: 108.5 fL — ABNORMAL HIGH (ref 80.0–100.0)
Platelets: 138 10*3/uL — ABNORMAL LOW (ref 150–400)
RBC: 2.72 MIL/uL — ABNORMAL LOW (ref 4.22–5.81)
RDW: 12.7 % (ref 11.5–15.5)
WBC: 10.3 10*3/uL (ref 4.0–10.5)
nRBC: 0 % (ref 0.0–0.2)

## 2022-12-09 LAB — MAGNESIUM: Magnesium: 2 mg/dL (ref 1.7–2.4)

## 2022-12-09 MED ORDER — SODIUM ZIRCONIUM CYCLOSILICATE 10 G PO PACK
10.0000 g | PACK | Freq: Once | ORAL | Status: AC
  Administered 2022-12-09: 10 g via ORAL
  Filled 2022-12-09: qty 1

## 2022-12-09 NOTE — Plan of Care (Signed)

## 2022-12-09 NOTE — Progress Notes (Signed)
Patient was given discharge instructions. All questions were answered. Transported via wheelchair to main entrance. 

## 2022-12-09 NOTE — Discharge Summary (Signed)
Patient ID: Thomas Guerrero MRN: [id number] DOB/AGE: November 10, 1945 77 y.o.  Admit date: 12/07/2022 Discharge date: 12/09/2022  Supervising Physician: Richarda Overlie  Patient Status: [facility] - In-pt  Admission Diagnoses: Right Renal Mass s/p renal cryoablation   Discharge Diagnoses:  Principal Problem:   Right renal mass Active Problems:   Renal lesion   Discharged Condition: good  Hospital Course:   Thomas Guerrero is a 77 y.o. male with past medical history of anemia, CKD stage 4, GERD, HTN, MGUS who was incidentially found to have a right renal lesion during ED work-up for abdominal pain and nausea earlier this year.  He met with Urology, Dr. Laverle Patter who referred him to IR for discussion of ablation.  He subsequently met with Dr. Lowella Dandy in consultation 10/12/22 and elected to proceed with ablation procedure.   He presented to the [facility] IR department 12/07/22 for his procedure. Post-procedure he was admitted for overnight observation as planned and was tentatively scheduled for discharge home 12/08/22. His potassium level the morning of discharge was elevated at 5.7. Several doses of Lokelma were administered and the potassium remained elevated. Thomas Guerrero was admitted for another night and the hospitalist team was consulted for management assistance. A UA performed that evening showed microscopic hematuria and proteinuria.   His lisinopril was discontinued and he was given gentle IV hydration overnight. His potassium this morning was 5 with a slight increase in BUN/Creatinine levels. He was given another dose of Lokelma. A renal ultrasound was performed and this was negative for any acute findings. The patient was afebrile, without leukocytosis and without pain or other complaints.   The patient was deemed stable for discharge with the following instructions: stop lisinopril, follow up with PCP in one week (with labs including repeat UA) and follow up with nephrology. The patient will follow up with Dr.  Lowella Dandy in 2-4 weeks.   These instructions were discussed with the patient who verbalized understanding. The patient denied any pain/discomfort and states he has been eating/drinking well, ambulating to the bathroom without difficulty and was ready for discharge home.   An order has been placed for a scheduler from our office to call him to arrange date/time for his follow up with Dr. Lowella Dandy. The patient is aware he will need to call his PCP to arrange an appointment. The patient was advised to call our clinic with any questions/concerns.   Consults: [facility] - Dr. Lowell Guitar.   Significant Diagnostic Studies: CT GUIDE TISSUE ABLATION  Result Date: 12/07/2022 INDICATION: 77 year old with a suspicious enhancing right renal lesion. This is highly concerning for a renal cell carcinoma. Plan for image guided cryoablation of the right renal lesion. EXAM: IMAGE GUIDED CRYOABLATION OF RIGHT RENAL LESION USING ULTRASOUND AND CT GUIDANCE COMPARISON:  MRI 09/30/2022 MEDICATIONS: Ancef 2 g ANESTHESIA/SEDATION: General - as administered by the Anesthesia department FLUOROSCOPY: None COMPLICATIONS: None immediate. TECHNIQUE: Informed written consent was obtained from the patient after a thorough discussion of the procedural risks, benefits and alternatives. All questions were addressed. Maximal Sterile Barrier Technique was utilized including caps, mask, sterile gowns, sterile gloves, sterile drape, hand hygiene and skin antiseptic. A timeout was performed prior to the initiation of the procedure. Patient was placed under general anesthesia. Patient was placed prone. Right renal lesion was identified using CT and ultrasound guidance. The right flank was prepped with chlorhexidine and sterile field was created. Skin incision was made. Using ultrasound guidance, a 14 gauge IcePearl cryoablation needle was advanced into the inferior aspect of the  right renal lesion. Placement was confirmed with CT. Second incision was  made. A second IcePearl needle was placed into the superior aspect of the right renal lesion using ultrasound guidance. Both needles were slightly adjusted using CT guidance. Both needles were placed just beyond the anterior margins of the renal lesion. Attention was made to the location of the right ureter. A 22 gauge spinal needle was initially directed towards the right kidney in order to perform hydro dissection. Elected not to use hydrodissection because concerned that the right ureter would be difficult to visualize after hydro dissection and concerned that the hydrodissection may actually move the ureter closer to the lesion. Due to the positioning of the cryoablation needles and the small size of the lesion, a renal biopsy was not attempted. The cryoablation was performed with an initial 10 minute freeze followed by an active thaw and a second freeze for 8 minutes. Periodic CT imaging was performed during the freezing to evaluate the ice ball growth. After the final thaw, the superior needle was slightly pulled back prior to using the cautery function on the needles. Cautery was performed with both needles. Both needles were removed. Follow up CT images were obtained. FINDINGS: Exophytic lesion along the medial inferior aspect of the right kidney measuring approximately 2.4 cm. Right ureter was in close proximity to the superior aspect of the lesion. Two cryoablation needles were placed within the lesion. Prior to ablation, the ureter was at least 1 cm from the tip of the cryoablation needles. Ureter appeared to be just peripheral to the ice ball during the freezing cycles. No gross abnormality to the right ureter at the end of the procedure. No significant perinephric bleeding following the procedure. IMPRESSION: Image guided cryoablation of the suspicious right renal lesion. Electronically Signed   By: Richarda Overlie M.D.   On: 12/07/2022 16:42   DG Chest Port 1 View  Result Date: 11/30/2022 CLINICAL DATA:   Preop exam for kidney surgery. EXAM: PORTABLE CHEST 1 VIEW COMPARISON:  Chest radiograph 08/13/2022, chest CT 10/26/2022 FINDINGS: Heart is normal in size.The cardiomediastinal contours are normal. The lungs are clear. Pulmonary vasculature is normal. No consolidation, pleural effusion, or pneumothorax. No acute osseous abnormalities are seen. IMPRESSION: No acute chest findings. Electronically Signed   By: Narda Rutherford M.D.   On: 11/30/2022 17:16    Treatments: Observation, labs, imaging.   Discharge Exam: Blood pressure (!) 142/62, pulse (!) 56, temperature 97.7 F (36.5 C), temperature source Oral, resp. rate 18, height 5\' 8"  (1.727 m), weight 197 lb 0.1 oz (89.4 kg), SpO2 97 %. Physical Exam Constitutional:      General: He is not in acute distress.    Appearance: He is not ill-appearing.  HENT:     Mouth/Throat:     Mouth: Mucous membranes are moist.     Pharynx: Oropharynx is clear.  Pulmonary:     Effort: Pulmonary effort is normal.  Abdominal:     Palpations: Abdomen is soft.     Tenderness: There is no abdominal tenderness.     Comments: Right flank/procedure site is clean, soft, dry and non-tender. No drainage or erythema. Dressing removed.   Skin:    General: Skin is warm and dry.  Neurological:     Mental Status: He is alert and oriented to person, place, and time.     Disposition: Discharge disposition: 01-Home or Self Care    Discharge Instructions     Care order/instruction   Complete by: As directed  Please follow up with your primary care provider in one week.   Discharge instructions   Complete by: As directed    You were seen by the hospitalist service for your high potassium levels and your acute kidney injury.  Your potassium is better today, but your kidney function is still not back to your baseline.    STOP your lisinopril.  Follow up with your kidney doctor within the next few weeks.  Follow up with your PCP within a week.  I'd like you to  get repeat labs to check your kidney function and potassium early next week.  You should AVOID NSAIDs (ibuprofen, advil, aleve, naproxen, etc) as these can worsen kidney function.  You had microscopic blood in your urine.  You'll need repeat urine studies to ensure this clears, I suspect this is a result of your procedure.    Follow interventional radiology's recommendations for your post procedure care.   Return for new, recurrent, or worsening symptoms.  Please ask your PCP to request records from this hospitalization so they know what was done and what the next steps will be.      Allergies as of 12/09/2022   No Known Allergies      Medication List     STOP taking these medications    lisinopril 20 MG tablet Commonly known as: ZESTRIL       TAKE these medications    acetaminophen 500 MG tablet Commonly known as: TYLENOL Take 500 mg by mouth 2 (two) times daily as needed for moderate pain.   allopurinol 100 MG tablet Commonly known as: ZYLOPRIM Take 100 mg by mouth daily.   amLODipine 2.5 MG tablet Commonly known as: NORVASC Take 2.5 mg by mouth 2 (two) times daily.   atorvastatin 10 MG tablet Commonly known as: LIPITOR Take 10 mg by mouth daily.   cholecalciferol 1000 units tablet Commonly known as: VITAMIN D Take 1,000 Units by mouth daily.   cyanocobalamin 1000 MCG tablet Commonly known as: VITAMIN B12 Take 1,000 mcg by mouth daily.   famotidine-calcium carbonate-magnesium hydroxide 10-800-165 MG chewable tablet Commonly known as: PEPCID COMPLETE Chew 1 tablet by mouth daily as needed (acid reflux).   ferrous sulfate 325 (65 FE) MG tablet Take 325 mg by mouth daily with breakfast.   gabapentin 300 MG capsule Commonly known as: NEURONTIN Take 1 capsule (300 mg total) by mouth 3 (three) times daily. What changed: when to take this   sodium bicarbonate 650 MG tablet Take 1 tablet (650 mg total) by mouth 2 (two) times daily.   triamcinolone cream  0.1 % Commonly known as: KENALOG Apply 1 application  topically 2 (two) times daily as needed (rash).         Electronically Signed: Mickie Kay, NP 12/09/2022, 10:27 AM   I have spent Less Than 30 Minutes discharging Thomas Guerrero.

## 2022-12-09 NOTE — Final Consult Note (Addendum)
PROGRESS NOTE    Thomas Guerrero  ZOX:[id number] DOB: 03-01-1946 DOA: 12/07/2022 PCP: Irven Coe, MD  No chief complaint on file.   Brief Narrative:   77 yo gentleman with hx CKD stage IV, MGUS, prostatic cancer, HTN, HLD and imaging findings concerning for renal cell carcinoma. He was admitted after Thomas Guerrero procedure with IR for image guided cryoablation of right renal lesion. Hospitalists were consulted due to hyperkalemia which has developed on 5/2 after his procedure.   Assessment & Plan:   Principal Problem:   Right renal mass Active Problems:   Renal lesion  Final Recommendations Ok to discharge from hospitalist standpoint. Stop lisinopril at discharge. Needs follow up for repeat labs early next week if possible to follow kidney function and lytes Ensure follow up with renal Needs follow up of UA to ensure resolution of microscopic hematuria    Hyperkalemia: Suspect related to lisinopril and AKI on CKD IV.  Improved after lokelma.  Will order additional dose of lokelma prior to discharge as k still on high side of normal and still with AKI.  Stop lisinopril at discharge.  Needs repeat labs early next week.   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  Will trial IVF for now.  His baseline creatinine appears to be closer to 2-2.1.  2.47 today, relatively stable from yesterday.  Suspect this is reaching Thomas Guerrero.  He's making adequate urine.  I'd expect his kidney function to improve over next several days.  UA notable for 30 mg/dl protein and microscopic hematuria. Stop lisinopril on discharge Repeat labs early next week Follow up with nephrology outpatient Needs repeat UA outpatient to follow microscopic hematuria and proteinuria  Addendum: IR ordered renal US due to concern regarding "right ureter and the ablation field" - follow results -> no hydro per report from IR   Presumed Renal Cell Carcinoma: s/p image guided cryoablation - per IR   Chronic issues per  primary   Hypertension: continue amlodipine.  Hold lisinopril.  Gout: continue allopurinol HLD: continue lipitor Neuropathy: gabapentin GERD: pepcid complete prn Continue supplements, vitamin b12, iron, vitamin d as ordered Hx MGUS follow outpatient Hx Prostate Ca follow outpatient    Disposition: per IR   Consultants:  Hospitalists are consulting  Procedures:  Imaged guided cryoablation of right renal lesion   Antimicrobials:  Anti-infectives (From admission, onward)    None       Subjective: No complaints this morning Feels well   Objective: Vitals:   12/08/22 0930 12/08/22 1228 12/08/22 2045 12/09/22 0557  BP: (!) 124/51 (!) 122/51 127/60 (!) 142/62  Pulse: 73 68 67 (!) 56  Resp: 17 17 18 18   Temp: 97.9 F (36.6 C) 97.6 F (36.4 C) 98.4 F (36.9 C) 97.7 F (36.5 C)  TempSrc: Oral Oral Oral Oral  SpO2: 96% 96% 94% 97%  Weight:      Height:        Intake/Output Summary (Last 24 hours) at 12/09/2022 0926 Last data filed at 12/09/2022 0335 Gross per 24 hour  Intake 1481.94 ml  Output 1550 ml  Net -68.06 ml   Filed Weights   12/07/22 1033 12/07/22 1600  Weight: 89.4 kg 89.4 kg    Examination:  General exam: Appears calm and comfortable  Respiratory system: unlabored Cardiovascular system: RRR Gastrointestinal system: Abdomen is nondistended, soft and nontender. Central nervous system: Alert and oriented. No focal neurological deficits. Extremities: no LEE   Data Reviewed: I have personally reviewed following labs  and imaging studies  CBC: Recent Labs  Lab 12/07/22 1030 12/08/22 0504 12/09/22 0516  WBC 6.4 13.9* 10.3  HGB 10.4* 10.6* 9.6*  HCT 31.8* 32.5* 29.5*  MCV 107.4* 106.6* 108.5*  PLT 155 146* 138*    Basic Metabolic Panel: Recent Labs  Lab 12/08/22 0504 12/08/22 1037 12/08/22 1349 12/08/22 1644 12/09/22 0516  NA 137 136 135 135 136  K 5.7* 5.6* 6.0* 5.1 5.0  CL 110 107 106 104 108  CO2 17* 18* 18* 20* 20*  GLUCOSE  144* 162* 132* 129* 122*  BUN 33* 38* 41* 44* 54*  CREATININE 2.11* 2.32* 2.31* 2.45* 2.47*  CALCIUM 9.5 9.2 9.1 9.1 8.8*  MG  --   --   --   --  2.0  PHOS  --   --   --   --  3.6    GFR: Estimated Creatinine Clearance: 27.2 mL/min ([name]) (by C-G formula based on SCr of 2.47 mg/dL (H)).  Liver Function Tests: No results for input(s): "AST", "ALT", "ALKPHOS", "BILITOT", "PROT", "ALBUMIN" in the last 168 hours.  CBG: Recent Labs  Lab 12/08/22 1654 12/08/22 2148 12/09/22 0726  GLUCAP 129* 137* 103*     No results found for this or any previous visit (from the past 240 hour(s)).       Radiology Studies: CT GUIDE TISSUE ABLATION  Result Date: 12/07/2022 INDICATION: 77 year old with [name] suspicious enhancing right renal lesion. This is highly concerning for [name] renal cell carcinoma. Plan for image guided cryoablation of the right renal lesion. EXAM: IMAGE GUIDED CRYOABLATION OF RIGHT RENAL LESION USING ULTRASOUND AND CT GUIDANCE COMPARISON:  MRI 09/30/2022 MEDICATIONS: Ancef 2 g ANESTHESIA/SEDATION: General - as administered by the Anesthesia department FLUOROSCOPY: None COMPLICATIONS: None immediate. TECHNIQUE: Informed written consent was obtained from the patient after [name] thorough discussion of the procedural risks, benefits and alternatives. All questions were addressed. Maximal Sterile Barrier Technique was utilized including caps, mask, sterile gowns, sterile gloves, sterile drape, hand hygiene and skin antiseptic. [name] timeout was performed prior to the initiation of the procedure. Patient was placed under general anesthesia. Patient was placed prone. Right renal lesion was identified using CT and ultrasound guidance. The right flank was prepped with chlorhexidine and sterile field was created. Skin incision was made. Using ultrasound guidance, [name] 14 gauge IcePearl cryoablation needle was advanced into the inferior aspect of the right renal lesion. Placement was confirmed with CT. Second incision  was made. [name] second IcePearl needle was placed into the superior aspect of the right renal lesion using ultrasound guidance. Both needles were slightly adjusted using CT guidance. Both needles were placed just beyond the anterior margins of the renal lesion. Attention was made to the location of the right ureter. Guy Toney 22 gauge spinal needle was initially directed towards the right kidney in order to perform hydro dissection. Elected not to use hydrodissection because concerned that the right ureter would be difficult to visualize after hydro dissection and concerned that the hydrodissection may actually move the ureter closer to the lesion. Due to the positioning of the cryoablation needles and the small size of the lesion, Zamari Bonsall renal biopsy was not attempted. The cryoablation was performed with an initial 10 minute freeze followed by an active thaw and Ellasyn Swilling second freeze for 8 minutes. Periodic CT imaging was performed during the freezing to evaluate the ice ball growth. After the final thaw, the superior needle was slightly pulled back prior to using the cautery function on the needles. Cautery was  performed with both needles. Both needles were removed. Follow up CT images were obtained. FINDINGS: Exophytic lesion along the medial inferior aspect of the right kidney measuring approximately 2.4 cm. Right ureter was in close proximity to the superior aspect of the lesion. Two cryoablation needles were placed within the lesion. Prior to ablation, the ureter was at least 1 cm from the tip of the cryoablation needles. Ureter appeared to be just peripheral to the ice ball during the freezing cycles. No gross abnormality to the right ureter at the end of the procedure. No significant perinephric bleeding following the procedure. IMPRESSION: Image guided cryoablation of the suspicious right renal lesion. Electronically Signed   By: Richarda Overlie M.D.   On: 12/07/2022 16:42        Scheduled Meds:  allopurinol  100 mg Oral Daily    amLODipine  2.5 mg Oral BID   atorvastatin  10 mg Oral Daily   cholecalciferol  1,000 Units Oral Daily   cyanocobalamin  1,000 mcg Oral Daily   docusate sodium  100 mg Oral BID   ferrous sulfate  325 mg Oral Q breakfast   gabapentin  300 mg Oral BID   insulin aspart  0-5 Units Subcutaneous QHS   insulin aspart  0-9 Units Subcutaneous TID WC   sodium bicarbonate  650 mg Oral BID   sodium zirconium cyclosilicate  10 g Oral Once   Continuous Infusions:   LOS: 0 days    Time spent: over 30 min    Lacretia Nicks, MD [facility]   To contact the attending provider between 7A-7P or the covering provider during after hours 7P-7A, please log into the web site [email or web address] and access using universal [place] password for that web site. If you do not have the password, please call the hospital operator.  12/09/2022, 9:26 AM

## 2022-12-13 DIAGNOSIS — E875 Hyperkalemia: Secondary | ICD-10-CM | POA: Diagnosis not present

## 2022-12-13 DIAGNOSIS — N2889 Other specified disorders of kidney and ureter: Secondary | ICD-10-CM | POA: Diagnosis not present

## 2022-12-13 DIAGNOSIS — R319 Hematuria, unspecified: Secondary | ICD-10-CM | POA: Diagnosis not present

## 2022-12-23 DIAGNOSIS — H35371 Puckering of macula, right eye: Secondary | ICD-10-CM | POA: Diagnosis not present

## 2022-12-23 DIAGNOSIS — H2513 Age-related nuclear cataract, bilateral: Secondary | ICD-10-CM | POA: Diagnosis not present

## 2022-12-23 DIAGNOSIS — H353132 Nonexudative age-related macular degeneration, bilateral, intermediate dry stage: Secondary | ICD-10-CM | POA: Diagnosis not present

## 2022-12-30 ENCOUNTER — Other Ambulatory Visit: Payer: Self-pay | Admitting: Diagnostic Radiology

## 2022-12-30 ENCOUNTER — Ambulatory Visit
Admission: RE | Admit: 2022-12-30 | Discharge: 2022-12-30 | Disposition: A | Discharge status: Home / Self Care | Payer: Medicare Other | Source: Ambulatory Visit | Attending: Student | Admitting: Student

## 2022-12-30 DIAGNOSIS — N289 Disorder of kidney and ureter, unspecified: Secondary | ICD-10-CM | POA: Diagnosis not present

## 2022-12-30 DIAGNOSIS — N179 Acute kidney failure, unspecified: Secondary | ICD-10-CM

## 2022-12-30 NOTE — Progress Notes (Signed)
Chief Complaint: Patient was consulted remotely today (TeleHealth) for Follow-up right renal lesion cryoablation  Referring Physician(s): Heloise Purpura  History of Present Illness: Thomas Guerrero is a 77 y.o. male with history of a suspicious right renal lesion that was recently treated with CT-guided cryoablation on 12/07/2022.  Past medical history is significant for anemia, chronic kidney disease, GERD, hypertension and MGUS.  The cryoablation was technically successful although there was concern about the close proximity between the cryoablation ice ball and the right ureter.  Patient was kept in the hospital overnight for observation and found to have an elevated potassium level the day following the ablation.  Patient was given several doses of Lokelma and hospital service was consulted.  Patient noted some hematuria after the Foley catheter was removed.  Patient's lisinopril was discontinued following the procedure and his potassium decreased to 5 prior to discharge although he had a slight increase in his creatinine level.  Renal ultrasound was performed on 12/09/2022 did not show any evidence for hydronephrosis.  Patient has been asymptomatic since he was discharged from the hospital.  He denies hematuria or dysuria.  He denies chest, flank or abdominal pain.  No bowel issues.  Patient has returned to his normal activities.Patient reports that his systolic blood pressure has been elevated lately measuring into the 150's and 160's.  He has been off the lisinopril since the procedure.  Past Medical History:  Diagnosis Date   Anemia    Arthritis    gout   Chronic kidney disease    GERD (gastroesophageal reflux disease)    High cholesterol    Hx of gout    Hypertension    MGUS (monoclonal gammopathy of unknown significance)    Prostate cancer [facility])     Past Surgical History:  Procedure Laterality Date   Biopsy of the Prostate     COLONOSCOPY  10/17/2016   EYE SURGERY Left 2023    scraped "film" off his eye   MOHS SURGERY  04/2019   forehead   RADIOLOGY WITH ANESTHESIA Right 12/07/2022   Procedure: CT RENAL CRYOABLATION;  Surgeon: Richarda Overlie, MD;  Location: [facility];  Service: Anesthesiology;  Laterality: Right;   TONSILLECTOMY      Allergies: Patient has no known allergies.  Medications: Prior to Admission medications   Medication Sig Start Date End Date Taking? Authorizing Provider  acetaminophen (TYLENOL) 500 MG tablet Take 500 mg by mouth 2 (two) times daily as needed for moderate pain.    [provider]  allopurinol (ZYLOPRIM) 100 MG tablet Take 100 mg by mouth daily. 07/24/19   [provider]  amLODipine (NORVASC) 2.5 MG tablet Take 2.5 mg by mouth 2 (two) times daily. 05/27/21   [provider]  atorvastatin (LIPITOR) 10 MG tablet Take 10 mg by mouth daily. 09/19/16   [provider]  cholecalciferol (VITAMIN D) 1000 units tablet Take 1,000 Units by mouth daily.    [provider]  cyanocobalamin (VITAMIN B12) 1000 MCG tablet Take 1,000 mcg by mouth daily.    [provider]  famotidine-calcium carbonate-magnesium hydroxide (PEPCID COMPLETE) 10-800-165 MG chewable tablet Chew 1 tablet by mouth daily as needed (acid reflux).    [provider]  ferrous sulfate 325 (65 FE) MG tablet Take 325 mg by mouth daily with breakfast.    [provider]  gabapentin (NEURONTIN) 300 MG capsule Take 1 capsule (300 mg total) by mouth 3 (three) times daily. Patient taking differently: Take 300 mg by  mouth 2 (two) times daily. 07/05/21   Lomax, Amy, NP  sodium bicarbonate 650 MG tablet Take 1 tablet (650 mg total) by mouth 2 (two) times daily. 08/18/22   Burnadette Pop, MD  triamcinolone cream (KENALOG) 0.1 % Apply 1 application  topically 2 (two) times daily as needed (rash). 04/18/16   [provider]     Family History  Problem Relation Age of Onset   Cancer Son        leukemia   Neuropathy  Neg Hx     Social History   Socioeconomic History   Marital status: Married    Spouse name: Not on file   Number of children: 2   Years of education: Some college   Highest education level: Not on file  Occupational History   Occupation: Retired  Tobacco Use   Smoking status: Former    Years: 25    Types: Cigarettes    Start date: 08/08/1996   Smokeless tobacco: Never  Vaping Use   Vaping Use: Never used  Substance and Sexual Activity   Alcohol use: Not Currently    Alcohol/week: 0.0 standard drinks of alcohol   Drug use: No   Sexual activity: Not Currently  Other Topics Concern   Not on file  Social History Narrative   Lives at home w/ his wife and daughter   Right-handed   Caffeine: 1 soda per day   Social Determinants of Health   Financial Resource Strain: Not on file  Food Insecurity: No Food Insecurity (12/07/2022)   Hunger Vital Sign    Worried About Running Out of Food in the Last Year: Never true    Ran Out of Food in the Last Year: Never true  Transportation Needs: No Transportation Needs (12/07/2022)   PRAPARE - Administrator, Civil Service (Medical): No    Lack of Transportation (Non-Medical): No  Physical Activity: Not on file  Stress: Not on file  Social Connections: Not on file     Review of Systems  Constitutional: Negative.   Respiratory: Negative.    Cardiovascular: Negative.   Gastrointestinal: Negative.   Genitourinary: Negative.      Physical Exam No direct physical exam was performed   Vital Signs: There were no vitals taken for this visit.  Imaging: US RENAL  Result Date: 12/09/2022 CLINICAL DATA:  Mild worsening of renal insufficiency following right renal cryoablation to treat a right renal neoplasm on 12/07/2022. EXAM: RENAL / URINARY TRACT ULTRASOUND COMPLETE COMPARISON:  Imaging during cryoablation on 12/07/2022, prior MRI of the abdomen on 09/30/2022 and prior renal ultrasound on 08/07/2018. FINDINGS: Right Kidney:  Renal measurements: 10.8 x 4.8 x 4.5 cm = volume: 121 mL. No hydronephrosis. Chronic renal atrophy with mildly echogenic cortex. No perinephric fluid collections following ablation. Parapelvic cyst measuring up to 2 cm. Left Kidney: Renal measurements: 10.3 x 5.5 x 5.5 cm = volume: 162 mL. No hydronephrosis. Mildly increased cortical echogenicity. Parapelvic cyst measuring up to 2.5 cm. Bladder: The bladder is moderately distended. Bilateral ureteral jets demonstrated. Other: None. IMPRESSION: 1. No evidence of hydronephrosis bilaterally. 2. No perinephric fluid collection following right renal ablation. 3. Chronic right renal atrophy. 4. Increased cortical echogenicity of both kidneys. 5. Bilateral parapelvic cysts. Electronically Signed   By: Irish Lack M.D.   On: 12/09/2022 11:10   CT GUIDE TISSUE ABLATION  Result Date: 12/07/2022 INDICATION: 77 year old with a suspicious enhancing right renal lesion. This is highly concerning for a renal cell carcinoma. Plan  for image guided cryoablation of the right renal lesion. EXAM: IMAGE GUIDED CRYOABLATION OF RIGHT RENAL LESION USING ULTRASOUND AND CT GUIDANCE COMPARISON:  MRI 09/30/2022 MEDICATIONS: Ancef 2 g ANESTHESIA/SEDATION: General - as administered by the Anesthesia department FLUOROSCOPY: None COMPLICATIONS: None immediate. TECHNIQUE: Informed written consent was obtained from the patient after a thorough discussion of the procedural risks, benefits and alternatives. All questions were addressed. Maximal Sterile Barrier Technique was utilized including caps, mask, sterile gowns, sterile gloves, sterile drape, hand hygiene and skin antiseptic. A timeout was performed prior to the initiation of the procedure. Patient was placed under general anesthesia. Patient was placed prone. Right renal lesion was identified using CT and ultrasound guidance. The right flank was prepped with chlorhexidine and sterile field was created. Skin incision was made. Using  ultrasound guidance, a 14 gauge IcePearl cryoablation needle was advanced into the inferior aspect of the right renal lesion. Placement was confirmed with CT. Second incision was made. A second IcePearl needle was placed into the superior aspect of the right renal lesion using ultrasound guidance. Both needles were slightly adjusted using CT guidance. Both needles were placed just beyond the anterior margins of the renal lesion. Attention was made to the location of the right ureter. A 22 gauge spinal needle was initially directed towards the right kidney in order to perform hydro dissection. Elected not to use hydrodissection because concerned that the right ureter would be difficult to visualize after hydro dissection and concerned that the hydrodissection may actually move the ureter closer to the lesion. Due to the positioning of the cryoablation needles and the small size of the lesion, a renal biopsy was not attempted. The cryoablation was performed with an initial 10 minute freeze followed by an active thaw and a second freeze for 8 minutes. Periodic CT imaging was performed during the freezing to evaluate the ice ball growth. After the final thaw, the superior needle was slightly pulled back prior to using the cautery function on the needles. Cautery was performed with both needles. Both needles were removed. Follow up CT images were obtained. FINDINGS: Exophytic lesion along the medial inferior aspect of the right kidney measuring approximately 2.4 cm. Right ureter was in close proximity to the superior aspect of the lesion. Two cryoablation needles were placed within the lesion. Prior to ablation, the ureter was at least 1 cm from the tip of the cryoablation needles. Ureter appeared to be just peripheral to the ice ball during the freezing cycles. No gross abnormality to the right ureter at the end of the procedure. No significant perinephric bleeding following the procedure. IMPRESSION: Image guided  cryoablation of the suspicious right renal lesion. Electronically Signed   By: Richarda Overlie M.D.   On: 12/07/2022 16:42    Labs:  CBC: Recent Labs    11/29/22 1428 12/07/22 1030 12/08/22 0504 12/09/22 0516  WBC 6.2 6.4 13.9* 10.3  HGB 10.9* 10.4* 10.6* 9.6*  HCT 33.6* 31.8* 32.5* 29.5*  PLT 165 155 146* 138*    COAGS: Recent Labs    12/07/22 1030  INR 1.0    BMP: Recent Labs    12/08/22 1037 12/08/22 1349 12/08/22 1644 12/09/22 0516  NA 136 135 135 136  K 5.6* 6.0* 5.1 5.0  CL 107 106 104 108  CO2 18* 18* 20* 20*  GLUCOSE 162* 132* 129* 122*  BUN 38* 41* 44* 54*  CALCIUM 9.2 9.1 9.1 8.8*  CREATININE 2.32* 2.31* 2.45* 2.47*  GFRNONAA 28* 28* 26* 26*  LIVER FUNCTION TESTS: Recent Labs    08/13/22 2109 08/14/22 0557 08/15/22 0600 08/16/22 0550  BILITOT 1.3* 0.8 0.9 1.2  AST 20 13* 13* 14*  ALT 13 11 10 10   ALKPHOS 64 52 48 46  PROT 8.1 6.1* 5.6* 5.1*  ALBUMIN 4.2 3.0* 2.9* 2.6*    TUMOR MARKERS: No results for input(s): "AFPTM", "CEA", "CA199", "CHROMGRNA" in the last 8760 hours.  Assessment and Plan:  77 year old with a suspicious right renal lesion that was treated with cryoablation on 12/07/2022.  Due to the size and location of the tumor, a biopsy was not performed.  Patient tolerated the cryoablation procedure well although he had an elevation in his potassium following the procedure.  Patient had follow-up labs on 12/14/2022 that demonstrated creatinine 2.1, potassium 4.9.  Creatinine prior to the procedure was 2.16.  Overall, the patient appears to be doing very well following the cryoablation.  Explained to the patient that we do not have a confirmed diagnosis but presume this was a small renal cell carcinoma based on the prior imaging.  Patient will require follow-up imaging to assess for complete treatment of the lesion and to look for any evidence of residual or recurrent disease.  We will plan for a follow-up renal MRI, with and without contrast, in  6 months.  In addition, I would like to get another renal ultrasound to confirm there is no evidence of hydronephrosis since I was concerned about the proximity of the right ureter with the ablation ice ball.  The renal ultrasound during the hospitalization was reassuring but would like to get another ultrasound.  In addition, I recommended that the patient contact his primary care physician about his blood pressure because he has not restarted his lisinopril.  Patient will contact us if he has any questions or concerns in the interim.  I told the patient that I would contact him if there was concern with his renal ultrasound.    Electronically Signed: Arn Medal 12/30/2022, 11:12 AM   I spent a total of  10 minutes   in remote  clinical consultation, greater than 50% of which was counseling/coordinating care for right renal cryoablation.    Visit type: Audio only (telephone). Audio (no video) only due to patient preference. Alternative for in-person consultation at [facility], [address] [place], [place], [place]. Patient ID: Thomas Guerrero, male   DOB: 08-30-1945, 77 y.o.   MRN: [id number]

## 2023-01-03 DIAGNOSIS — L578 Other skin changes due to chronic exposure to nonionizing radiation: Secondary | ICD-10-CM | POA: Diagnosis not present

## 2023-01-03 DIAGNOSIS — D2261 Melanocytic nevi of right upper limb, including shoulder: Secondary | ICD-10-CM | POA: Diagnosis not present

## 2023-01-03 DIAGNOSIS — D2272 Melanocytic nevi of left lower limb, including hip: Secondary | ICD-10-CM | POA: Diagnosis not present

## 2023-01-03 DIAGNOSIS — L570 Actinic keratosis: Secondary | ICD-10-CM | POA: Diagnosis not present

## 2023-01-03 DIAGNOSIS — L821 Other seborrheic keratosis: Secondary | ICD-10-CM | POA: Diagnosis not present

## 2023-01-03 DIAGNOSIS — D225 Melanocytic nevi of trunk: Secondary | ICD-10-CM | POA: Diagnosis not present

## 2023-01-03 DIAGNOSIS — Z85828 Personal history of other malignant neoplasm of skin: Secondary | ICD-10-CM | POA: Diagnosis not present

## 2023-01-13 ENCOUNTER — Ambulatory Visit
Admission: RE | Admit: 2023-01-13 | Discharge: 2023-01-13 | Disposition: A | Discharge status: Home / Self Care | Payer: Medicare Other | Source: Ambulatory Visit | Attending: Diagnostic Radiology | Admitting: Diagnostic Radiology

## 2023-01-13 DIAGNOSIS — N179 Acute kidney failure, unspecified: Secondary | ICD-10-CM

## 2023-01-13 DIAGNOSIS — K859 Acute pancreatitis without necrosis or infection, unspecified: Secondary | ICD-10-CM | POA: Diagnosis not present

## 2023-01-13 DIAGNOSIS — I10 Essential (primary) hypertension: Secondary | ICD-10-CM | POA: Diagnosis not present

## 2023-01-13 DIAGNOSIS — C61 Malignant neoplasm of prostate: Secondary | ICD-10-CM | POA: Diagnosis not present

## 2023-01-13 DIAGNOSIS — N184 Chronic kidney disease, stage 4 (severe): Secondary | ICD-10-CM | POA: Diagnosis not present

## 2023-02-20 DIAGNOSIS — D509 Iron deficiency anemia, unspecified: Secondary | ICD-10-CM | POA: Diagnosis not present

## 2023-02-20 DIAGNOSIS — N1832 Chronic kidney disease, stage 3b: Secondary | ICD-10-CM | POA: Diagnosis not present

## 2023-02-28 DIAGNOSIS — E559 Vitamin D deficiency, unspecified: Secondary | ICD-10-CM | POA: Diagnosis not present

## 2023-02-28 DIAGNOSIS — E872 Acidosis, unspecified: Secondary | ICD-10-CM | POA: Diagnosis not present

## 2023-02-28 DIAGNOSIS — N1832 Chronic kidney disease, stage 3b: Secondary | ICD-10-CM | POA: Diagnosis not present

## 2023-02-28 DIAGNOSIS — N2581 Secondary hyperparathyroidism of renal origin: Secondary | ICD-10-CM | POA: Diagnosis not present

## 2023-02-28 DIAGNOSIS — I129 Hypertensive chronic kidney disease with stage 1 through stage 4 chronic kidney disease, or unspecified chronic kidney disease: Secondary | ICD-10-CM | POA: Diagnosis not present

## 2023-02-28 DIAGNOSIS — E875 Hyperkalemia: Secondary | ICD-10-CM | POA: Diagnosis not present

## 2023-02-28 DIAGNOSIS — N2889 Other specified disorders of kidney and ureter: Secondary | ICD-10-CM | POA: Diagnosis not present

## 2023-02-28 DIAGNOSIS — D631 Anemia in chronic kidney disease: Secondary | ICD-10-CM | POA: Diagnosis not present

## 2023-03-02 DIAGNOSIS — H524 Presbyopia: Secondary | ICD-10-CM | POA: Diagnosis not present

## 2023-03-02 DIAGNOSIS — H2513 Age-related nuclear cataract, bilateral: Secondary | ICD-10-CM | POA: Diagnosis not present

## 2023-03-02 DIAGNOSIS — H35372 Puckering of macula, left eye: Secondary | ICD-10-CM | POA: Diagnosis not present

## 2023-04-19 DIAGNOSIS — Z85528 Personal history of other malignant neoplasm of kidney: Secondary | ICD-10-CM | POA: Diagnosis not present

## 2023-04-19 DIAGNOSIS — C61 Malignant neoplasm of prostate: Secondary | ICD-10-CM | POA: Diagnosis not present

## 2023-04-20 ENCOUNTER — Other Ambulatory Visit: Payer: Self-pay | Admitting: Urology

## 2023-04-20 DIAGNOSIS — Z85528 Personal history of other malignant neoplasm of kidney: Secondary | ICD-10-CM

## 2023-04-24 DIAGNOSIS — H25812 Combined forms of age-related cataract, left eye: Secondary | ICD-10-CM | POA: Diagnosis not present

## 2023-04-24 DIAGNOSIS — H2512 Age-related nuclear cataract, left eye: Secondary | ICD-10-CM | POA: Diagnosis not present

## 2023-04-24 DIAGNOSIS — Z961 Presence of intraocular lens: Secondary | ICD-10-CM | POA: Diagnosis not present

## 2023-04-24 DIAGNOSIS — H21562 Pupillary abnormality, left eye: Secondary | ICD-10-CM | POA: Diagnosis not present

## 2023-04-27 DIAGNOSIS — H25811 Combined forms of age-related cataract, right eye: Secondary | ICD-10-CM | POA: Diagnosis not present

## 2023-04-27 DIAGNOSIS — H2511 Age-related nuclear cataract, right eye: Secondary | ICD-10-CM | POA: Diagnosis not present

## 2023-05-24 DIAGNOSIS — Z23 Encounter for immunization: Secondary | ICD-10-CM | POA: Diagnosis not present

## 2023-05-24 DIAGNOSIS — N2889 Other specified disorders of kidney and ureter: Secondary | ICD-10-CM | POA: Diagnosis not present

## 2023-05-24 DIAGNOSIS — Z8546 Personal history of malignant neoplasm of prostate: Secondary | ICD-10-CM | POA: Diagnosis not present

## 2023-05-24 DIAGNOSIS — M109 Gout, unspecified: Secondary | ICD-10-CM | POA: Diagnosis not present

## 2023-05-24 DIAGNOSIS — D472 Monoclonal gammopathy: Secondary | ICD-10-CM | POA: Diagnosis not present

## 2023-05-24 DIAGNOSIS — Z0000 Encounter for general adult medical examination without abnormal findings: Secondary | ICD-10-CM | POA: Diagnosis not present

## 2023-05-24 DIAGNOSIS — I129 Hypertensive chronic kidney disease with stage 1 through stage 4 chronic kidney disease, or unspecified chronic kidney disease: Secondary | ICD-10-CM | POA: Diagnosis not present

## 2023-05-24 DIAGNOSIS — N183 Chronic kidney disease, stage 3 unspecified: Secondary | ICD-10-CM | POA: Diagnosis not present

## 2023-05-24 DIAGNOSIS — E7800 Pure hypercholesterolemia, unspecified: Secondary | ICD-10-CM | POA: Diagnosis not present

## 2023-06-01 DIAGNOSIS — Z961 Presence of intraocular lens: Secondary | ICD-10-CM | POA: Diagnosis not present

## 2023-06-01 DIAGNOSIS — H35372 Puckering of macula, left eye: Secondary | ICD-10-CM | POA: Diagnosis not present

## 2023-06-11 ENCOUNTER — Ambulatory Visit
Admission: RE | Admit: 2023-06-11 | Discharge: 2023-06-11 | Disposition: A | Discharge status: Home / Self Care | Payer: Medicare Other | Source: Ambulatory Visit | Attending: Urology | Admitting: Urology

## 2023-06-11 DIAGNOSIS — N289 Disorder of kidney and ureter, unspecified: Secondary | ICD-10-CM | POA: Diagnosis not present

## 2023-06-11 DIAGNOSIS — Z85528 Personal history of other malignant neoplasm of kidney: Secondary | ICD-10-CM

## 2023-06-11 MED ORDER — GADOPICLENOL 0.5 MMOL/ML IV SOLN
10.0000 mL | Freq: Once | INTRAVENOUS | Status: AC | PRN
  Administered 2023-06-11: 10 mL via INTRAVENOUS

## 2023-06-29 DIAGNOSIS — N184 Chronic kidney disease, stage 4 (severe): Secondary | ICD-10-CM | POA: Diagnosis not present

## 2023-07-05 DIAGNOSIS — E872 Acidosis, unspecified: Secondary | ICD-10-CM | POA: Diagnosis not present

## 2023-07-05 DIAGNOSIS — E875 Hyperkalemia: Secondary | ICD-10-CM | POA: Diagnosis not present

## 2023-07-05 DIAGNOSIS — I129 Hypertensive chronic kidney disease with stage 1 through stage 4 chronic kidney disease, or unspecified chronic kidney disease: Secondary | ICD-10-CM | POA: Diagnosis not present

## 2023-07-05 DIAGNOSIS — N2581 Secondary hyperparathyroidism of renal origin: Secondary | ICD-10-CM | POA: Diagnosis not present

## 2023-07-05 DIAGNOSIS — N2889 Other specified disorders of kidney and ureter: Secondary | ICD-10-CM | POA: Diagnosis not present

## 2023-07-05 DIAGNOSIS — N184 Chronic kidney disease, stage 4 (severe): Secondary | ICD-10-CM | POA: Diagnosis not present

## 2023-07-05 DIAGNOSIS — E559 Vitamin D deficiency, unspecified: Secondary | ICD-10-CM | POA: Diagnosis not present

## 2023-07-05 DIAGNOSIS — D631 Anemia in chronic kidney disease: Secondary | ICD-10-CM | POA: Diagnosis not present

## 2023-08-14 DIAGNOSIS — N184 Chronic kidney disease, stage 4 (severe): Secondary | ICD-10-CM | POA: Diagnosis not present

## 2023-09-28 NOTE — Addendum Note (Signed)
Encounter addended by: Edilia Bo on: 09/28/2023 10:45 AM  Actions taken: Imaging Exam ended, Charge Capture section accepted

## 2023-09-29 DIAGNOSIS — H353132 Nonexudative age-related macular degeneration, bilateral, intermediate dry stage: Secondary | ICD-10-CM | POA: Diagnosis not present

## 2023-09-29 DIAGNOSIS — H59032 Cystoid macular edema following cataract surgery, left eye: Secondary | ICD-10-CM | POA: Diagnosis not present

## 2023-09-29 DIAGNOSIS — H35371 Puckering of macula, right eye: Secondary | ICD-10-CM | POA: Diagnosis not present

## 2023-10-17 DIAGNOSIS — I129 Hypertensive chronic kidney disease with stage 1 through stage 4 chronic kidney disease, or unspecified chronic kidney disease: Secondary | ICD-10-CM | POA: Diagnosis not present

## 2023-10-17 DIAGNOSIS — N184 Chronic kidney disease, stage 4 (severe): Secondary | ICD-10-CM | POA: Diagnosis not present

## 2023-10-17 DIAGNOSIS — N183 Chronic kidney disease, stage 3 unspecified: Secondary | ICD-10-CM | POA: Diagnosis not present

## 2023-10-26 DIAGNOSIS — H35371 Puckering of macula, right eye: Secondary | ICD-10-CM | POA: Diagnosis not present

## 2023-10-26 DIAGNOSIS — H59032 Cystoid macular edema following cataract surgery, left eye: Secondary | ICD-10-CM | POA: Diagnosis not present

## 2023-10-26 DIAGNOSIS — H353132 Nonexudative age-related macular degeneration, bilateral, intermediate dry stage: Secondary | ICD-10-CM | POA: Diagnosis not present

## 2023-10-31 DIAGNOSIS — N184 Chronic kidney disease, stage 4 (severe): Secondary | ICD-10-CM | POA: Diagnosis not present

## 2023-11-06 DIAGNOSIS — Z8546 Personal history of malignant neoplasm of prostate: Secondary | ICD-10-CM | POA: Diagnosis not present

## 2023-11-06 DIAGNOSIS — D631 Anemia in chronic kidney disease: Secondary | ICD-10-CM | POA: Diagnosis not present

## 2023-11-06 DIAGNOSIS — N2581 Secondary hyperparathyroidism of renal origin: Secondary | ICD-10-CM | POA: Diagnosis not present

## 2023-11-06 DIAGNOSIS — I129 Hypertensive chronic kidney disease with stage 1 through stage 4 chronic kidney disease, or unspecified chronic kidney disease: Secondary | ICD-10-CM | POA: Diagnosis not present

## 2023-11-06 DIAGNOSIS — N2889 Other specified disorders of kidney and ureter: Secondary | ICD-10-CM | POA: Diagnosis not present

## 2023-11-06 DIAGNOSIS — N189 Chronic kidney disease, unspecified: Secondary | ICD-10-CM | POA: Diagnosis not present

## 2023-11-06 DIAGNOSIS — N184 Chronic kidney disease, stage 4 (severe): Secondary | ICD-10-CM | POA: Diagnosis not present

## 2023-11-06 DIAGNOSIS — I10 Essential (primary) hypertension: Secondary | ICD-10-CM | POA: Diagnosis not present

## 2023-11-06 DIAGNOSIS — E875 Hyperkalemia: Secondary | ICD-10-CM | POA: Diagnosis not present

## 2023-11-06 DIAGNOSIS — E872 Acidosis, unspecified: Secondary | ICD-10-CM | POA: Diagnosis not present

## 2023-11-06 DIAGNOSIS — E7800 Pure hypercholesterolemia, unspecified: Secondary | ICD-10-CM | POA: Diagnosis not present

## 2023-11-06 DIAGNOSIS — E559 Vitamin D deficiency, unspecified: Secondary | ICD-10-CM | POA: Diagnosis not present

## 2023-11-15 DIAGNOSIS — I129 Hypertensive chronic kidney disease with stage 1 through stage 4 chronic kidney disease, or unspecified chronic kidney disease: Secondary | ICD-10-CM | POA: Diagnosis not present

## 2023-11-15 DIAGNOSIS — N184 Chronic kidney disease, stage 4 (severe): Secondary | ICD-10-CM | POA: Diagnosis not present

## 2023-11-15 DIAGNOSIS — N183 Chronic kidney disease, stage 3 unspecified: Secondary | ICD-10-CM | POA: Diagnosis not present

## 2023-12-05 DIAGNOSIS — M109 Gout, unspecified: Secondary | ICD-10-CM | POA: Diagnosis not present

## 2023-12-05 DIAGNOSIS — E7800 Pure hypercholesterolemia, unspecified: Secondary | ICD-10-CM | POA: Diagnosis not present

## 2023-12-06 DIAGNOSIS — N184 Chronic kidney disease, stage 4 (severe): Secondary | ICD-10-CM | POA: Diagnosis not present

## 2023-12-06 DIAGNOSIS — Z8546 Personal history of malignant neoplasm of prostate: Secondary | ICD-10-CM | POA: Diagnosis not present

## 2023-12-06 DIAGNOSIS — E7800 Pure hypercholesterolemia, unspecified: Secondary | ICD-10-CM | POA: Diagnosis not present

## 2023-12-06 DIAGNOSIS — I129 Hypertensive chronic kidney disease with stage 1 through stage 4 chronic kidney disease, or unspecified chronic kidney disease: Secondary | ICD-10-CM | POA: Diagnosis not present

## 2023-12-06 DIAGNOSIS — M109 Gout, unspecified: Secondary | ICD-10-CM | POA: Diagnosis not present

## 2023-12-06 DIAGNOSIS — N2889 Other specified disorders of kidney and ureter: Secondary | ICD-10-CM | POA: Diagnosis not present

## 2023-12-06 DIAGNOSIS — D472 Monoclonal gammopathy: Secondary | ICD-10-CM | POA: Diagnosis not present

## 2023-12-06 DIAGNOSIS — N183 Chronic kidney disease, stage 3 unspecified: Secondary | ICD-10-CM | POA: Diagnosis not present

## 2023-12-07 DIAGNOSIS — H59032 Cystoid macular edema following cataract surgery, left eye: Secondary | ICD-10-CM | POA: Diagnosis not present

## 2023-12-07 DIAGNOSIS — H353132 Nonexudative age-related macular degeneration, bilateral, intermediate dry stage: Secondary | ICD-10-CM | POA: Diagnosis not present

## 2023-12-07 DIAGNOSIS — H35371 Puckering of macula, right eye: Secondary | ICD-10-CM | POA: Diagnosis not present

## 2023-12-15 DIAGNOSIS — N183 Chronic kidney disease, stage 3 unspecified: Secondary | ICD-10-CM | POA: Diagnosis not present

## 2023-12-15 DIAGNOSIS — I129 Hypertensive chronic kidney disease with stage 1 through stage 4 chronic kidney disease, or unspecified chronic kidney disease: Secondary | ICD-10-CM | POA: Diagnosis not present

## 2023-12-15 DIAGNOSIS — N184 Chronic kidney disease, stage 4 (severe): Secondary | ICD-10-CM | POA: Diagnosis not present

## 2024-01-06 DIAGNOSIS — N183 Chronic kidney disease, stage 3 unspecified: Secondary | ICD-10-CM | POA: Diagnosis not present

## 2024-01-06 DIAGNOSIS — Z8546 Personal history of malignant neoplasm of prostate: Secondary | ICD-10-CM | POA: Diagnosis not present

## 2024-01-06 DIAGNOSIS — I129 Hypertensive chronic kidney disease with stage 1 through stage 4 chronic kidney disease, or unspecified chronic kidney disease: Secondary | ICD-10-CM | POA: Diagnosis not present

## 2024-01-06 DIAGNOSIS — N184 Chronic kidney disease, stage 4 (severe): Secondary | ICD-10-CM | POA: Diagnosis not present

## 2024-01-06 DIAGNOSIS — E7800 Pure hypercholesterolemia, unspecified: Secondary | ICD-10-CM | POA: Diagnosis not present

## 2024-01-14 DIAGNOSIS — I129 Hypertensive chronic kidney disease with stage 1 through stage 4 chronic kidney disease, or unspecified chronic kidney disease: Secondary | ICD-10-CM | POA: Diagnosis not present

## 2024-01-14 DIAGNOSIS — N183 Chronic kidney disease, stage 3 unspecified: Secondary | ICD-10-CM | POA: Diagnosis not present

## 2024-01-14 DIAGNOSIS — N184 Chronic kidney disease, stage 4 (severe): Secondary | ICD-10-CM | POA: Diagnosis not present

## 2024-02-05 DIAGNOSIS — I129 Hypertensive chronic kidney disease with stage 1 through stage 4 chronic kidney disease, or unspecified chronic kidney disease: Secondary | ICD-10-CM | POA: Diagnosis not present

## 2024-02-05 DIAGNOSIS — E7800 Pure hypercholesterolemia, unspecified: Secondary | ICD-10-CM | POA: Diagnosis not present

## 2024-02-05 DIAGNOSIS — N183 Chronic kidney disease, stage 3 unspecified: Secondary | ICD-10-CM | POA: Diagnosis not present

## 2024-02-05 DIAGNOSIS — N184 Chronic kidney disease, stage 4 (severe): Secondary | ICD-10-CM | POA: Diagnosis not present

## 2024-02-05 DIAGNOSIS — Z8546 Personal history of malignant neoplasm of prostate: Secondary | ICD-10-CM | POA: Diagnosis not present

## 2024-02-13 DIAGNOSIS — N184 Chronic kidney disease, stage 4 (severe): Secondary | ICD-10-CM | POA: Diagnosis not present

## 2024-02-13 DIAGNOSIS — I129 Hypertensive chronic kidney disease with stage 1 through stage 4 chronic kidney disease, or unspecified chronic kidney disease: Secondary | ICD-10-CM | POA: Diagnosis not present

## 2024-02-13 DIAGNOSIS — N183 Chronic kidney disease, stage 3 unspecified: Secondary | ICD-10-CM | POA: Diagnosis not present

## 2024-03-07 DIAGNOSIS — I129 Hypertensive chronic kidney disease with stage 1 through stage 4 chronic kidney disease, or unspecified chronic kidney disease: Secondary | ICD-10-CM | POA: Diagnosis not present

## 2024-03-07 DIAGNOSIS — N184 Chronic kidney disease, stage 4 (severe): Secondary | ICD-10-CM | POA: Diagnosis not present

## 2024-03-07 DIAGNOSIS — N183 Chronic kidney disease, stage 3 unspecified: Secondary | ICD-10-CM | POA: Diagnosis not present

## 2024-03-07 DIAGNOSIS — E7800 Pure hypercholesterolemia, unspecified: Secondary | ICD-10-CM | POA: Diagnosis not present

## 2024-03-07 DIAGNOSIS — Z8546 Personal history of malignant neoplasm of prostate: Secondary | ICD-10-CM | POA: Diagnosis not present

## 2024-03-14 DIAGNOSIS — I129 Hypertensive chronic kidney disease with stage 1 through stage 4 chronic kidney disease, or unspecified chronic kidney disease: Secondary | ICD-10-CM | POA: Diagnosis not present

## 2024-03-14 DIAGNOSIS — N184 Chronic kidney disease, stage 4 (severe): Secondary | ICD-10-CM | POA: Diagnosis not present

## 2024-03-14 DIAGNOSIS — N183 Chronic kidney disease, stage 3 unspecified: Secondary | ICD-10-CM | POA: Diagnosis not present

## 2024-04-07 DIAGNOSIS — I129 Hypertensive chronic kidney disease with stage 1 through stage 4 chronic kidney disease, or unspecified chronic kidney disease: Secondary | ICD-10-CM | POA: Diagnosis not present

## 2024-04-07 DIAGNOSIS — N184 Chronic kidney disease, stage 4 (severe): Secondary | ICD-10-CM | POA: Diagnosis not present

## 2024-04-07 DIAGNOSIS — N183 Chronic kidney disease, stage 3 unspecified: Secondary | ICD-10-CM | POA: Diagnosis not present

## 2024-04-07 DIAGNOSIS — E7800 Pure hypercholesterolemia, unspecified: Secondary | ICD-10-CM | POA: Diagnosis not present

## 2024-04-07 DIAGNOSIS — Z8546 Personal history of malignant neoplasm of prostate: Secondary | ICD-10-CM | POA: Diagnosis not present

## 2024-04-10 DIAGNOSIS — Z85528 Personal history of other malignant neoplasm of kidney: Secondary | ICD-10-CM | POA: Diagnosis not present

## 2024-04-10 DIAGNOSIS — C61 Malignant neoplasm of prostate: Secondary | ICD-10-CM | POA: Diagnosis not present

## 2024-04-13 DIAGNOSIS — I129 Hypertensive chronic kidney disease with stage 1 through stage 4 chronic kidney disease, or unspecified chronic kidney disease: Secondary | ICD-10-CM | POA: Diagnosis not present

## 2024-04-13 DIAGNOSIS — N184 Chronic kidney disease, stage 4 (severe): Secondary | ICD-10-CM | POA: Diagnosis not present

## 2024-04-13 DIAGNOSIS — N183 Chronic kidney disease, stage 3 unspecified: Secondary | ICD-10-CM | POA: Diagnosis not present

## 2024-04-17 ENCOUNTER — Other Ambulatory Visit: Payer: Self-pay | Admitting: Urology

## 2024-04-17 DIAGNOSIS — Z85528 Personal history of other malignant neoplasm of kidney: Secondary | ICD-10-CM

## 2024-04-17 DIAGNOSIS — C61 Malignant neoplasm of prostate: Secondary | ICD-10-CM | POA: Diagnosis not present

## 2024-04-19 ENCOUNTER — Ambulatory Visit
Admission: RE | Admit: 2024-04-19 | Discharge: 2024-04-19 | Disposition: A | Discharge status: Home / Self Care | Source: Ambulatory Visit | Attending: Urology | Admitting: Urology

## 2024-04-19 DIAGNOSIS — Z85528 Personal history of other malignant neoplasm of kidney: Secondary | ICD-10-CM

## 2024-04-19 MED ORDER — GADOPICLENOL 0.5 MMOL/ML IV SOLN
8.0000 mL | Freq: Once | INTRAVENOUS | Status: AC | PRN
  Administered 2024-04-19: 8 mL via INTRAVENOUS

## 2024-04-21 DIAGNOSIS — Z85528 Personal history of other malignant neoplasm of kidney: Secondary | ICD-10-CM | POA: Diagnosis not present

## 2024-05-03 DIAGNOSIS — N184 Chronic kidney disease, stage 4 (severe): Secondary | ICD-10-CM | POA: Diagnosis not present

## 2024-05-07 DIAGNOSIS — N184 Chronic kidney disease, stage 4 (severe): Secondary | ICD-10-CM | POA: Diagnosis not present

## 2024-05-07 DIAGNOSIS — E559 Vitamin D deficiency, unspecified: Secondary | ICD-10-CM | POA: Diagnosis not present

## 2024-05-07 DIAGNOSIS — N2889 Other specified disorders of kidney and ureter: Secondary | ICD-10-CM | POA: Diagnosis not present

## 2024-05-07 DIAGNOSIS — D631 Anemia in chronic kidney disease: Secondary | ICD-10-CM | POA: Diagnosis not present

## 2024-05-07 DIAGNOSIS — N2581 Secondary hyperparathyroidism of renal origin: Secondary | ICD-10-CM | POA: Diagnosis not present

## 2024-05-07 DIAGNOSIS — E872 Acidosis, unspecified: Secondary | ICD-10-CM | POA: Diagnosis not present

## 2024-05-07 DIAGNOSIS — E7800 Pure hypercholesterolemia, unspecified: Secondary | ICD-10-CM | POA: Diagnosis not present

## 2024-05-07 DIAGNOSIS — M109 Gout, unspecified: Secondary | ICD-10-CM | POA: Diagnosis not present

## 2024-05-07 DIAGNOSIS — Z8546 Personal history of malignant neoplasm of prostate: Secondary | ICD-10-CM | POA: Diagnosis not present

## 2024-05-07 DIAGNOSIS — E875 Hyperkalemia: Secondary | ICD-10-CM | POA: Diagnosis not present

## 2024-05-07 DIAGNOSIS — N183 Chronic kidney disease, stage 3 unspecified: Secondary | ICD-10-CM | POA: Diagnosis not present

## 2024-05-07 DIAGNOSIS — I129 Hypertensive chronic kidney disease with stage 1 through stage 4 chronic kidney disease, or unspecified chronic kidney disease: Secondary | ICD-10-CM | POA: Diagnosis not present

## 2024-05-13 ENCOUNTER — Other Ambulatory Visit: Payer: Self-pay | Admitting: Diagnostic Radiology

## 2024-05-13 DIAGNOSIS — I129 Hypertensive chronic kidney disease with stage 1 through stage 4 chronic kidney disease, or unspecified chronic kidney disease: Secondary | ICD-10-CM | POA: Diagnosis not present

## 2024-05-13 DIAGNOSIS — N184 Chronic kidney disease, stage 4 (severe): Secondary | ICD-10-CM | POA: Diagnosis not present

## 2024-05-13 DIAGNOSIS — N183 Chronic kidney disease, stage 3 unspecified: Secondary | ICD-10-CM | POA: Diagnosis not present

## 2024-05-13 DIAGNOSIS — D49511 Neoplasm of unspecified behavior of right kidney: Secondary | ICD-10-CM

## 2024-05-15 NOTE — Addendum Note (Signed)
 Encounter addended by: Jannifer Ade, RT on: 05/15/2024 12:42 PM  Actions taken: Imaging Exam ended

## 2024-05-24 DIAGNOSIS — Z23 Encounter for immunization: Secondary | ICD-10-CM | POA: Diagnosis not present

## 2024-05-24 DIAGNOSIS — N2889 Other specified disorders of kidney and ureter: Secondary | ICD-10-CM | POA: Diagnosis not present

## 2024-05-24 DIAGNOSIS — N183 Chronic kidney disease, stage 3 unspecified: Secondary | ICD-10-CM | POA: Diagnosis not present

## 2024-05-24 DIAGNOSIS — D472 Monoclonal gammopathy: Secondary | ICD-10-CM | POA: Diagnosis not present

## 2024-05-24 DIAGNOSIS — E7800 Pure hypercholesterolemia, unspecified: Secondary | ICD-10-CM | POA: Diagnosis not present

## 2024-05-24 DIAGNOSIS — L989 Disorder of the skin and subcutaneous tissue, unspecified: Secondary | ICD-10-CM | POA: Diagnosis not present

## 2024-05-24 DIAGNOSIS — Z8546 Personal history of malignant neoplasm of prostate: Secondary | ICD-10-CM | POA: Diagnosis not present

## 2024-05-24 DIAGNOSIS — Z0000 Encounter for general adult medical examination without abnormal findings: Secondary | ICD-10-CM | POA: Diagnosis not present

## 2024-05-24 DIAGNOSIS — M109 Gout, unspecified: Secondary | ICD-10-CM | POA: Diagnosis not present

## 2024-05-24 DIAGNOSIS — I129 Hypertensive chronic kidney disease with stage 1 through stage 4 chronic kidney disease, or unspecified chronic kidney disease: Secondary | ICD-10-CM | POA: Diagnosis not present

## 2024-06-04 DIAGNOSIS — H353131 Nonexudative age-related macular degeneration, bilateral, early dry stage: Secondary | ICD-10-CM | POA: Diagnosis not present

## 2024-06-04 DIAGNOSIS — H52203 Unspecified astigmatism, bilateral: Secondary | ICD-10-CM | POA: Diagnosis not present

## 2024-06-04 DIAGNOSIS — H5310 Unspecified subjective visual disturbances: Secondary | ICD-10-CM | POA: Diagnosis not present

## 2024-06-04 DIAGNOSIS — Z961 Presence of intraocular lens: Secondary | ICD-10-CM | POA: Diagnosis not present

## 2024-06-06 ENCOUNTER — Ambulatory Visit
Admission: RE | Admit: 2024-06-06 | Discharge: 2024-06-06 | Disposition: A | Discharge status: Home / Self Care | Source: Ambulatory Visit | Attending: Diagnostic Radiology | Admitting: Diagnostic Radiology

## 2024-06-06 DIAGNOSIS — N289 Disorder of kidney and ureter, unspecified: Secondary | ICD-10-CM | POA: Diagnosis not present

## 2024-06-06 DIAGNOSIS — D49511 Neoplasm of unspecified behavior of right kidney: Secondary | ICD-10-CM

## 2024-06-06 NOTE — Progress Notes (Signed)
 Chief Complaint: Patient was consulted remotely today (TeleHealth) for follow up of right renal lesion ablation.    Referring Physician(s): Border, Gretel Frees, Eli  History of Present Illness: Thomas Guerrero is a 78 y.o. male with history of a suspicious right renal lesion that was treated with CT-guided cryoablation on 12/07/2022.  Past medical history is significant for anemia, chronic kidney disease, GERD, hypertension and MGUS.  Patient has no urinary symptoms.  He denies dysuria or hematuria.  He has chronic kidney disease and creatinine is stable at 2.54.  He has lost 20 lbs in last 6 months due to decreased appetite and Dr. Frees is aware.  Recent episode on transient darkness in right eye and already evaluated by his eye doctor.  Follow up abdominal MRI on 04/19/24.  Past Medical History:  Diagnosis Date   Anemia    Arthritis    gout   Chronic kidney disease    GERD (gastroesophageal reflux disease)    High cholesterol    Hx of gout    Hypertension    MGUS (monoclonal gammopathy of unknown significance)    Prostate cancer [facility])     Past Surgical History:  Procedure Laterality Date   Biopsy of the Prostate     COLONOSCOPY  10/17/2016   EYE SURGERY Left 2023   scraped film off his eye   MOHS SURGERY  04/2019   forehead   RADIOLOGY WITH ANESTHESIA Right 12/07/2022   Procedure: CT RENAL CRYOABLATION;  Surgeon: Philip Cornet, MD;  Location: [facility];  Service: Anesthesiology;  Laterality: Right;   TONSILLECTOMY      Allergies: Patient has no known allergies.  Medications: Prior to Admission medications   Medication Sig Start Date End Date Taking? Authorizing Provider  acetaminophen  (TYLENOL ) 500 MG tablet Take 500 mg by mouth 2 (two) times daily as needed for moderate pain.    [provider]  allopurinol  (ZYLOPRIM ) 100 MG tablet Take 100 mg by mouth daily. 07/24/19   [provider]  amLODipine  (NORVASC ) 2.5 MG tablet Take 2.5 mg by mouth 2  (two) times daily. 05/27/21   [provider]  atorvastatin  (LIPITOR) 10 MG tablet Take 10 mg by mouth daily. 09/19/16   [provider]  cholecalciferol  (VITAMIN D ) 1000 units tablet Take 1,000 Units by mouth daily.    [provider]  cyanocobalamin  (VITAMIN B12) 1000 MCG tablet Take 1,000 mcg by mouth daily.    [provider]  famotidine -calcium  carbonate-magnesium hydroxide (PEPCID  COMPLETE) 10-800-165 MG chewable tablet Chew 1 tablet by mouth daily as needed (acid reflux).    [provider]  ferrous sulfate  325 (65 FE) MG tablet Take 325 mg by mouth daily with breakfast.    [provider]  gabapentin  (NEURONTIN ) 300 MG capsule Take 1 capsule (300 mg total) by mouth 3 (three) times daily. Patient taking differently: Take 300 mg by mouth 2 (two) times daily. 07/05/21   Lomax, Amy, NP  sodium bicarbonate  650 MG tablet Take 1 tablet (650 mg total) by mouth 2 (two) times daily. 08/18/22   Adhikari, Amrit, MD  triamcinolone cream (KENALOG) 0.1 % Apply 1 application  topically 2 (two) times daily as needed (rash). 04/18/16   [provider]     Family History  Problem Relation Age of Onset   Cancer Son        leukemia   Neuropathy Neg Hx     Social History   Socioeconomic History   Marital status: Married  Spouse name: Not on file   Number of children: 2   Years of education: Some college   Highest education level: Not on file  Occupational History   Occupation: Retired  Tobacco Use   Smoking status: Former    Types: Cigarettes    Start date: 08/08/1996   Smokeless tobacco: Never  Vaping Use   Vaping status: Never Used  Substance and Sexual Activity   Alcohol use: Not Currently    Alcohol/week: 0.0 standard drinks of alcohol   Drug use: No   Sexual activity: Not Currently  Other Topics Concern   Not on file  Social History Narrative   Lives at home w/ his wife and daughter   Right-handed   Caffeine: 1 soda per  day   Social Drivers of Health   Financial Resource Strain: Not on file  Food Insecurity: No Food Insecurity (12/07/2022)   Hunger Vital Sign    Worried About Running Out of Food in the Last Year: Never true    Ran Out of Food in the Last Year: Never true  Transportation Needs: No Transportation Needs (12/07/2022)   PRAPARE - Administrator, Civil Service (Medical): No    Lack of Transportation (Non-Medical): No  Physical Activity: Not on file  Stress: Not on file  Social Connections: Not on file    ECOG Status: 1 - Symptomatic but completely ambulatory  Review of Systems  Constitutional:  Positive for unexpected weight change.  Respiratory: Negative.    Cardiovascular: Negative.   Gastrointestinal:  Positive for constipation.  Genitourinary:  Negative for dysuria and hematuria.     Physical Exam No direct physical exam was performed  Vital Signs: There were no vitals taken for this visit.  Imaging: Narrative & Impression  CLINICAL DATA:  History of right kidney cancer, follow-up.   EXAM: MRI ABDOMEN WITHOUT AND WITH CONTRAST   TECHNIQUE: Multiplanar multisequence MR imaging of the abdomen was performed both before and after the administration of intravenous contrast.   CONTRAST:  No contrast dose information available at time dictation. Addendum to follow when this information is made available.   COMPARISON:  Multiple priors including CT June 11, 2023.   FINDINGS: Lower chest: No acute abnormality.   Hepatobiliary: No significant hepatic steatosis or iron deposition. No suspicious hepatic lesion. Gallbladder is unremarkable. No biliary ductal dilation.   Pancreas: No pancreatic ductal dilation or evidence of acute inflammation.   Spleen:  No splenomegaly.   Adrenals/Urinary Tract:  No suspicious adrenal nodule/mass.   Cryoablation defect in the lower pole right kidney measures 2.0 x 1.5 cm on image 35/6 previously 2.2 x 1.9 cm, there  heterogeneous intrinsic T1 signal without definite postcontrast enhancement on subtraction imaging.   Bilateral renal sinus cysts and renal cortical cysts.   Stomach/Bowel: Visualized portions within the abdomen are unremarkable.   Vascular/Lymphatic: No pathologically enlarged lymph nodes identified. No abdominal aortic aneurysm demonstrated.   Other:  None.   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 No suspicious osseous lesion.   IMPRESSION: 1. Cryoablation defect in the lower pole right kidney without convincing evidence of residual or recurrent disease. 2. No evidence of metastatic disease in the abdomen.     Electronically Signed   By: Reyes Holder M.D.   On: 04/21/2024 11:55    Labs: Results  Results Component Value Reference Range Notes  Lipid Panel w/reflex Reviewed date:05/28/2024 02:41:11 PM Interpretation:  Performing Lab: Notes/Report: Testing Performed at: [facility], [address], [place], [place] [address]  Cholesterol 145 <200 mg/dL    CHOL/HDL 4.0 7.9-5.9 Ratio    HDLD 36 30-70 mg/dL Values below 40 mg/dL indicate increased risk factor  Triglyceride 185 0-199 mg/dL    NHDL 890 9-870 mg/dL Range dependent upon risk factors.  LDL Chol Calc (NIH) 78 0-99 mg/dL    MR Abdomen W/ WO Reviewed date:05/26/2024 01:05:39 PM Interpretation: Performing Lab: Notes/Report:  CBC with Diff Reviewed date:05/28/2024 02:41:11 PM Interpretation: Performing Lab: Notes/Report: Testing Performed at: [facility], [address], [address], [place], [place] [address]  WBC 5.7 4.0-11.0 K/ul    RBC 2.95 4.20-5.80 M/uL    HGB 10.0 13.0-17.0 g/dL    HCT 70.8 60.9-47.9 %    MCV 98.8 80.0-94.0 fL    MCH 34.0 27.0-33.0 pg    MCHC 34.4 32.0-36.0 g/dL    RDW 86.6 88.4-84.4 %    PLT 152 150-400 K/uL    MPV 9.1 7.5-10.7 fL    NE% 70.7 43.3-71.9 %    LY% 20.1  16.8-43.5 %    MO% 6.0 4.6-12.4 %    EO% 2.7 0.0-7.8 %    BA% 0.5 0.0-1.0 %    NE# 4.0 1.9-7.2 K/uL    LY# 1.10 1.10-2.70 K/uL    MO# 0.3 0.3-0.8 K/uL    EO# 0.2 0.0-0.6 K/uL    BA# 0.0 0.0-0.1 K/uL    NRBC% 0.10      NRBC# 0.01      Comp Metabolic Panel Reviewed date:05/28/2024 02:41:11 PM Interpretation: Performing Lab: Notes/Report: Testing Performed at: [facility], [address], [address], [place], [place] [address]  Glucose 97 70-99 mg/dL    BUN 28 3-73 mg/dL    Creatinine 7.45 9.39-8.69 mg/dl    [id number] 25 >39 calc Stage 1 > 90 ML/Min plus Albuminuria;Stage 2 60-89 ML/MIN;Stage 3 30-59 ML/MIN;Stage 4 15-29 ML/MIN;Stage 5 <15 ML/MIN  Sodium 139 136-145 mmol/L    Potassium 4.4 3.5-5.5 mmol/L    Chloride 107 98-107 mmol/L    CO2 27 22-32 mmol/L    Anion Gap 10.2 6.0-20.0 mmol/L    Calcium  9.7 8.6-10.3 mg/dL    CA-corrected 0.65 1.39-89.69 mg/dL    Protein, Total 6.4 3.9-1.6 g/dL    Albumin 4.4 6.5-5.1 g/dL    TBIL 0.5 9.6-8.9 mg/dL    ALP 64 61-873 U/L    AST 22 0-39 U/L    ALT 16 0-52 U/L    Uric Acid Reviewed date:05/28/2024 02:41:11 PM Interpretation: Performing Lab: Notes/Report: Testing Performed at: [facility], [address], [address], [place], [place] [address]  URIC 5.6 4.4-7.6 mg/dL    Uric Acid Reviewed ijuz:[id number]/[id number] 08:05:49 AM Interpretation: Performing Lab: Notes/Report: Testing Performed at: [facility], [address], [place], [place] [address]  URIC 5.7 4.4-7.6 mg/dL    Lipid Panel w/reflex Reviewed date:12/06/2023 08:05:49 AM Interpretation: Performing Lab: Notes/Report: Testing Performed at: [facility], [address], [address], [place], [place] [address]  Cholesterol 148 <200 mg/dL    CHOL/HDL 4.1 7.9-5.9 Ratio    HDLD 36 30-70 mg/dL Values below 40 mg/dL indicate increased risk factor  Triglyceride 161 0-199 mg/dL    NHDL 887 9-870 mg/dL Range dependent upon risk factors.  LDL Chol Calc (NIH) 84 0-99 mg/dL       Assessment and Plan:  78 year old with history of a suspicious right renal lesion that was treated with cryoablation on 12/07/2022. Due to the  size and location of the tumor, a biopsy was not performed. Most recent MRI was performed on 04/19/2024.  There is no evidence for residual or recurrent disease at the right kidney ablation site.  No new renal lesions.  Patient has no urinary symptoms.  Patient has chronic kidney disease that appears to be stable.  He reports 20 pound weight loss over 6 months and his primary care physician is aware.  We will continue surveillance imaging.  Plan for follow-up abdominal MRI, with and without contrast, in 1 year.   Electronically Signed: Juliene JONELLE Balder 06/06/2024, 3:17 PM   I spent a total of    5 Minutes in remote  clinical consultation, greater than 50% of which was counseling/coordinating care for follow up renal lesion ablation.    Visit type: Audio only (telephone). Audio (no video) only due to technical difficulties for the patient. Patient ID: Gurfateh Mcclain, male   DOB: [birth year], 78 y.o.   MRN: [id number]

## 2024-06-07 DIAGNOSIS — I129 Hypertensive chronic kidney disease with stage 1 through stage 4 chronic kidney disease, or unspecified chronic kidney disease: Secondary | ICD-10-CM | POA: Diagnosis not present

## 2024-06-07 DIAGNOSIS — N184 Chronic kidney disease, stage 4 (severe): Secondary | ICD-10-CM | POA: Diagnosis not present

## 2024-06-07 DIAGNOSIS — Z8546 Personal history of malignant neoplasm of prostate: Secondary | ICD-10-CM | POA: Diagnosis not present

## 2024-06-07 DIAGNOSIS — E7800 Pure hypercholesterolemia, unspecified: Secondary | ICD-10-CM | POA: Diagnosis not present

## 2024-06-07 DIAGNOSIS — N183 Chronic kidney disease, stage 3 unspecified: Secondary | ICD-10-CM | POA: Diagnosis not present

## 2024-06-12 DIAGNOSIS — N183 Chronic kidney disease, stage 3 unspecified: Secondary | ICD-10-CM | POA: Diagnosis not present

## 2024-06-12 DIAGNOSIS — I129 Hypertensive chronic kidney disease with stage 1 through stage 4 chronic kidney disease, or unspecified chronic kidney disease: Secondary | ICD-10-CM | POA: Diagnosis not present

## 2024-06-12 DIAGNOSIS — N184 Chronic kidney disease, stage 4 (severe): Secondary | ICD-10-CM | POA: Diagnosis not present

## 2024-06-17 ENCOUNTER — Other Ambulatory Visit: Payer: Self-pay | Admitting: Family Medicine

## 2024-06-17 DIAGNOSIS — R42 Dizziness and giddiness: Secondary | ICD-10-CM

## 2024-06-17 DIAGNOSIS — G453 Amaurosis fugax: Secondary | ICD-10-CM | POA: Diagnosis not present

## 2024-06-20 DIAGNOSIS — H35371 Puckering of macula, right eye: Secondary | ICD-10-CM | POA: Diagnosis not present

## 2024-06-20 DIAGNOSIS — H59032 Cystoid macular edema following cataract surgery, left eye: Secondary | ICD-10-CM | POA: Diagnosis not present

## 2024-06-20 DIAGNOSIS — H353132 Nonexudative age-related macular degeneration, bilateral, intermediate dry stage: Secondary | ICD-10-CM | POA: Diagnosis not present

## 2024-06-21 ENCOUNTER — Ambulatory Visit
Admission: RE | Admit: 2024-06-21 | Discharge: 2024-06-21 | Disposition: A | Discharge status: Home / Self Care | Source: Ambulatory Visit | Attending: Family Medicine | Admitting: Family Medicine

## 2024-06-21 DIAGNOSIS — R55 Syncope and collapse: Secondary | ICD-10-CM | POA: Diagnosis not present

## 2024-06-21 DIAGNOSIS — G453 Amaurosis fugax: Secondary | ICD-10-CM

## 2024-07-07 ENCOUNTER — Ambulatory Visit
Admission: RE | Admit: 2024-07-07 | Discharge: 2024-07-07 | Disposition: A | Source: Ambulatory Visit | Attending: Family Medicine | Admitting: Family Medicine

## 2024-07-07 DIAGNOSIS — R42 Dizziness and giddiness: Secondary | ICD-10-CM | POA: Diagnosis not present

## 2024-07-07 DIAGNOSIS — I129 Hypertensive chronic kidney disease with stage 1 through stage 4 chronic kidney disease, or unspecified chronic kidney disease: Secondary | ICD-10-CM | POA: Diagnosis not present

## 2024-07-07 DIAGNOSIS — Z8546 Personal history of malignant neoplasm of prostate: Secondary | ICD-10-CM | POA: Diagnosis not present

## 2024-07-07 DIAGNOSIS — N183 Chronic kidney disease, stage 3 unspecified: Secondary | ICD-10-CM | POA: Diagnosis not present

## 2024-07-07 DIAGNOSIS — N184 Chronic kidney disease, stage 4 (severe): Secondary | ICD-10-CM | POA: Diagnosis not present

## 2024-07-07 DIAGNOSIS — E7800 Pure hypercholesterolemia, unspecified: Secondary | ICD-10-CM | POA: Diagnosis not present

## 2024-07-11 DIAGNOSIS — R42 Dizziness and giddiness: Secondary | ICD-10-CM | POA: Diagnosis not present

## 2024-07-14 DIAGNOSIS — R42 Dizziness and giddiness: Secondary | ICD-10-CM | POA: Diagnosis not present
